# Patient Record
Sex: Female | Born: 1960 | Race: White | Hispanic: No | Marital: Single | Smoking: Former smoker
Health system: Southern US, Community
[De-identification: ages and names within clinical notes are randomized; demographics above are authoritative.]

## PROBLEM LIST (undated history)

## (undated) DIAGNOSIS — E559 Vitamin D deficiency, unspecified: Secondary | ICD-10-CM

## (undated) DIAGNOSIS — M654 Radial styloid tenosynovitis [de Quervain]: Secondary | ICD-10-CM

## (undated) DIAGNOSIS — Z862 Personal history of diseases of the blood and blood-forming organs and certain disorders involving the immune mechanism: Secondary | ICD-10-CM

## (undated) DIAGNOSIS — E039 Hypothyroidism, unspecified: Secondary | ICD-10-CM

## (undated) DIAGNOSIS — I10 Essential (primary) hypertension: Secondary | ICD-10-CM

## (undated) DIAGNOSIS — J449 Chronic obstructive pulmonary disease, unspecified: Secondary | ICD-10-CM

## (undated) DIAGNOSIS — R12 Heartburn: Secondary | ICD-10-CM

## (undated) DIAGNOSIS — E663 Overweight: Secondary | ICD-10-CM

## (undated) DIAGNOSIS — R079 Chest pain, unspecified: Secondary | ICD-10-CM

## (undated) DIAGNOSIS — D649 Anemia, unspecified: Secondary | ICD-10-CM

## (undated) HISTORY — DX: Vitamin D deficiency, unspecified: E55.9

## (undated) HISTORY — DX: Hypothyroidism, unspecified: E03.9

## (undated) HISTORY — DX: Anemia, unspecified: D64.9

## (undated) HISTORY — DX: Overweight: E66.3

## (undated) HISTORY — DX: Chronic obstructive pulmonary disease, unspecified: J44.9

## (undated) HISTORY — PX: BREAST EXCISIONAL BIOPSY: SUR124

## (undated) HISTORY — DX: Essential (primary) hypertension: I10

---
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
## 1982-09-23 HISTORY — PX: TONSILLECTOMY: SHX5217

## 1992-09-23 HISTORY — PX: ABDOMINAL HYSTERECTOMY: SHX81

## 2000-09-23 HISTORY — PX: COLON SURGERY: SHX602

## 2001-02-10 ENCOUNTER — Emergency Department (HOSPITAL_COMMUNITY): Admission: EM | Admit: 2001-02-10 | Discharge: 2001-02-10 | Payer: Self-pay | Admitting: *Deleted

## 2001-02-10 ENCOUNTER — Encounter: Payer: Self-pay | Admitting: *Deleted

## 2001-08-17 ENCOUNTER — Emergency Department (HOSPITAL_COMMUNITY): Admission: EM | Admit: 2001-08-17 | Discharge: 2001-08-17 | Payer: Self-pay | Admitting: Emergency Medicine

## 2001-08-17 ENCOUNTER — Encounter: Payer: Self-pay | Admitting: Emergency Medicine

## 2002-01-26 ENCOUNTER — Encounter: Payer: Self-pay | Admitting: Emergency Medicine

## 2002-01-26 ENCOUNTER — Emergency Department (HOSPITAL_COMMUNITY): Admission: EM | Admit: 2002-01-26 | Discharge: 2002-01-26 | Payer: Self-pay | Admitting: Emergency Medicine

## 2002-02-12 ENCOUNTER — Emergency Department (HOSPITAL_COMMUNITY): Admission: EM | Admit: 2002-02-12 | Discharge: 2002-02-12 | Payer: Self-pay | Admitting: Emergency Medicine

## 2002-05-07 ENCOUNTER — Ambulatory Visit (HOSPITAL_COMMUNITY): Admission: RE | Admit: 2002-05-07 | Discharge: 2002-05-07 | Payer: Self-pay | Admitting: Cardiology

## 2002-07-19 ENCOUNTER — Ambulatory Visit (HOSPITAL_COMMUNITY): Admission: RE | Admit: 2002-07-19 | Discharge: 2002-07-19 | Payer: Self-pay | Admitting: Family Medicine

## 2002-07-19 ENCOUNTER — Encounter: Payer: Self-pay | Admitting: Family Medicine

## 2002-07-22 ENCOUNTER — Encounter: Payer: Self-pay | Admitting: Family Medicine

## 2002-07-22 ENCOUNTER — Ambulatory Visit (HOSPITAL_COMMUNITY): Admission: RE | Admit: 2002-07-22 | Discharge: 2002-07-22 | Payer: Self-pay | Admitting: Family Medicine

## 2002-08-06 ENCOUNTER — Emergency Department (HOSPITAL_COMMUNITY): Admission: EM | Admit: 2002-08-06 | Discharge: 2002-08-06 | Payer: Self-pay | Admitting: Emergency Medicine

## 2002-08-06 ENCOUNTER — Encounter: Payer: Self-pay | Admitting: Emergency Medicine

## 2002-09-08 ENCOUNTER — Ambulatory Visit (HOSPITAL_COMMUNITY): Admission: RE | Admit: 2002-09-08 | Discharge: 2002-09-08 | Payer: Self-pay | Admitting: General Surgery

## 2002-09-10 ENCOUNTER — Inpatient Hospital Stay (HOSPITAL_COMMUNITY): Admission: RE | Admit: 2002-09-10 | Discharge: 2002-09-13 | Payer: Self-pay | Admitting: General Surgery

## 2002-12-26 ENCOUNTER — Encounter: Payer: Self-pay | Admitting: *Deleted

## 2002-12-26 ENCOUNTER — Emergency Department (HOSPITAL_COMMUNITY): Admission: EM | Admit: 2002-12-26 | Discharge: 2002-12-26 | Payer: Self-pay | Admitting: *Deleted

## 2003-02-16 ENCOUNTER — Emergency Department (HOSPITAL_COMMUNITY): Admission: EM | Admit: 2003-02-16 | Discharge: 2003-02-16 | Payer: Self-pay | Admitting: Emergency Medicine

## 2003-02-16 ENCOUNTER — Encounter: Payer: Self-pay | Admitting: Emergency Medicine

## 2003-04-20 ENCOUNTER — Emergency Department (HOSPITAL_COMMUNITY): Admission: EM | Admit: 2003-04-20 | Discharge: 2003-04-20 | Payer: Self-pay | Admitting: Emergency Medicine

## 2003-07-12 ENCOUNTER — Emergency Department (HOSPITAL_COMMUNITY): Admission: EM | Admit: 2003-07-12 | Discharge: 2003-07-12 | Payer: Self-pay | Admitting: Emergency Medicine

## 2004-02-03 ENCOUNTER — Other Ambulatory Visit: Admission: RE | Admit: 2004-02-03 | Discharge: 2004-02-03 | Payer: Self-pay | Admitting: General Surgery

## 2004-03-02 ENCOUNTER — Emergency Department (HOSPITAL_COMMUNITY): Admission: EM | Admit: 2004-03-02 | Discharge: 2004-03-02 | Payer: Self-pay | Admitting: Emergency Medicine

## 2004-04-23 ENCOUNTER — Emergency Department (HOSPITAL_COMMUNITY): Admission: EM | Admit: 2004-04-23 | Discharge: 2004-04-23 | Payer: Self-pay | Admitting: Emergency Medicine

## 2005-04-13 ENCOUNTER — Other Ambulatory Visit: Payer: Self-pay

## 2005-04-13 ENCOUNTER — Emergency Department: Payer: Self-pay | Admitting: Emergency Medicine

## 2005-10-29 ENCOUNTER — Ambulatory Visit (HOSPITAL_COMMUNITY): Admission: RE | Admit: 2005-10-29 | Discharge: 2005-10-29 | Payer: Self-pay | Admitting: Family Medicine

## 2005-11-08 ENCOUNTER — Ambulatory Visit (HOSPITAL_COMMUNITY): Admission: RE | Admit: 2005-11-08 | Discharge: 2005-11-08 | Payer: Self-pay | Admitting: Family Medicine

## 2005-12-02 ENCOUNTER — Ambulatory Visit (HOSPITAL_COMMUNITY): Admission: RE | Admit: 2005-12-02 | Discharge: 2005-12-02 | Payer: Self-pay | Admitting: Family Medicine

## 2006-10-17 ENCOUNTER — Ambulatory Visit (HOSPITAL_COMMUNITY): Admission: RE | Admit: 2006-10-17 | Discharge: 2006-10-17 | Payer: Self-pay | Admitting: Family Medicine

## 2006-10-27 ENCOUNTER — Emergency Department (HOSPITAL_COMMUNITY): Admission: EM | Admit: 2006-10-27 | Discharge: 2006-10-27 | Payer: Self-pay | Admitting: Emergency Medicine

## 2006-10-28 ENCOUNTER — Ambulatory Visit (HOSPITAL_COMMUNITY): Admission: RE | Admit: 2006-10-28 | Discharge: 2006-10-28 | Payer: Self-pay | Admitting: Family Medicine

## 2006-11-20 ENCOUNTER — Ambulatory Visit: Payer: Self-pay | Admitting: Gastroenterology

## 2006-12-24 ENCOUNTER — Encounter (INDEPENDENT_AMBULATORY_CARE_PROVIDER_SITE_OTHER): Payer: Self-pay | Admitting: *Deleted

## 2006-12-24 ENCOUNTER — Ambulatory Visit: Payer: Self-pay | Admitting: Gastroenterology

## 2007-07-17 ENCOUNTER — Emergency Department (HOSPITAL_COMMUNITY): Admission: EM | Admit: 2007-07-17 | Discharge: 2007-07-17 | Payer: Self-pay | Admitting: Emergency Medicine

## 2007-08-11 ENCOUNTER — Emergency Department (HOSPITAL_COMMUNITY): Admission: EM | Admit: 2007-08-11 | Discharge: 2007-08-11 | Payer: Self-pay | Admitting: Emergency Medicine

## 2008-01-12 ENCOUNTER — Encounter: Payer: Self-pay | Admitting: Orthopedic Surgery

## 2008-01-12 ENCOUNTER — Ambulatory Visit (HOSPITAL_COMMUNITY): Admission: RE | Admit: 2008-01-12 | Discharge: 2008-01-12 | Payer: Self-pay | Admitting: Family Medicine

## 2008-06-23 ENCOUNTER — Ambulatory Visit: Payer: Self-pay | Admitting: Orthopedic Surgery

## 2008-06-23 ENCOUNTER — Telehealth: Payer: Self-pay | Admitting: Orthopedic Surgery

## 2008-06-23 DIAGNOSIS — M654 Radial styloid tenosynovitis [de Quervain]: Secondary | ICD-10-CM

## 2008-06-23 DIAGNOSIS — M79609 Pain in unspecified limb: Secondary | ICD-10-CM | POA: Insufficient documentation

## 2008-06-23 DIAGNOSIS — S53106A Unspecified dislocation of unspecified ulnohumeral joint, initial encounter: Secondary | ICD-10-CM | POA: Insufficient documentation

## 2008-06-23 HISTORY — DX: Radial styloid tenosynovitis (de quervain): M65.4

## 2008-06-28 ENCOUNTER — Encounter: Payer: Self-pay | Admitting: Orthopedic Surgery

## 2008-06-30 ENCOUNTER — Encounter: Payer: Self-pay | Admitting: Orthopedic Surgery

## 2008-07-13 ENCOUNTER — Ambulatory Visit: Payer: Self-pay | Admitting: Orthopedic Surgery

## 2008-07-20 ENCOUNTER — Encounter: Payer: Self-pay | Admitting: Orthopedic Surgery

## 2008-07-29 ENCOUNTER — Encounter: Payer: Self-pay | Admitting: Orthopedic Surgery

## 2008-08-01 ENCOUNTER — Encounter: Payer: Self-pay | Admitting: Orthopedic Surgery

## 2008-08-02 ENCOUNTER — Encounter: Payer: Self-pay | Admitting: Orthopedic Surgery

## 2008-08-03 ENCOUNTER — Telehealth: Payer: Self-pay | Admitting: Orthopedic Surgery

## 2008-08-04 ENCOUNTER — Ambulatory Visit: Payer: Self-pay | Admitting: Orthopedic Surgery

## 2008-08-05 ENCOUNTER — Encounter: Payer: Self-pay | Admitting: Orthopedic Surgery

## 2008-08-11 ENCOUNTER — Encounter: Payer: Self-pay | Admitting: Orthopedic Surgery

## 2008-09-01 ENCOUNTER — Ambulatory Visit: Payer: Self-pay | Admitting: Orthopedic Surgery

## 2008-09-29 ENCOUNTER — Ambulatory Visit: Payer: Self-pay | Admitting: Orthopedic Surgery

## 2008-10-03 ENCOUNTER — Encounter: Payer: Self-pay | Admitting: Orthopedic Surgery

## 2008-10-10 ENCOUNTER — Emergency Department (HOSPITAL_COMMUNITY): Admission: EM | Admit: 2008-10-10 | Discharge: 2008-10-10 | Payer: Self-pay | Admitting: Emergency Medicine

## 2008-10-25 ENCOUNTER — Ambulatory Visit (HOSPITAL_COMMUNITY): Admission: RE | Admit: 2008-10-25 | Discharge: 2008-10-25 | Payer: Self-pay | Admitting: Family Medicine

## 2008-11-01 ENCOUNTER — Encounter: Payer: Self-pay | Admitting: Orthopedic Surgery

## 2008-11-03 ENCOUNTER — Ambulatory Visit: Payer: Self-pay | Admitting: Orthopedic Surgery

## 2008-12-01 ENCOUNTER — Ambulatory Visit: Payer: Self-pay | Admitting: Orthopedic Surgery

## 2008-12-20 ENCOUNTER — Encounter: Payer: Self-pay | Admitting: Orthopedic Surgery

## 2008-12-21 ENCOUNTER — Encounter: Payer: Self-pay | Admitting: Orthopedic Surgery

## 2009-01-11 ENCOUNTER — Encounter: Payer: Self-pay | Admitting: Orthopedic Surgery

## 2009-11-22 ENCOUNTER — Emergency Department: Payer: Self-pay | Admitting: Emergency Medicine

## 2011-02-08 NOTE — Op Note (Signed)
   NAME:  Stephanie Howe, Stephanie Howe                          ACCOUNT NO.:  [id number]   MEDICAL RECORD NO.:  [id number]                   PATIENT TYPE:  INP   LOCATION:  A332                                 FACILITY:  [facility]   PHYSICIAN:  Dirk Dress. Katrinka Blazing, M.D.                DATE OF BIRTH:  Feb 18, 1961   DATE OF PROCEDURE:  09/10/2002  DATE OF DISCHARGE:                                 OPERATIVE REPORT   PREOPERATIVE DIAGNOSES:  Chronic inflammation of the left colon, chronic  left breast abscess.   POSTOPERATIVE DIAGNOSES:  Left breast abscess, inflamed appendiceal  epiploica.   PROCEDURE:  Left breast biopsy and exploratory laparotomy.   SURGEON:  Dirk Dress. Katrinka Blazing, M.D.   DESCRIPTION OF PROCEDURE:  Under general anesthesia, the patient's left  breast and abdomen were prepped and draped in a sterile field. An elliptical  incision was made around the mass in the lower outer quadrant of the left  breast. It was extended down to the subcutaneous fat. The mass was excised  in its entirety. The tissues were closed with 3-0 Biosyn and the skin was  closed with subcuticular 4-0 Dexon. A dressing was placed. Attention was  then turned to the abdomen. A midline incision was made. Upon entering the  abdomen, there was no free fluid. There was some thickening of about three  appendiceal epiploica in the sigmoid colon at its junction with the  descending colon. The colon wall appeared to be normal. There was no  thickening of the bowel wall per say. This was followed down through the  sigmoid to the rectum down to the peritoneal reflection. No inflammation was  noted. No diverticulosis was noted. The mesentery was normal. There was no  inflammation of the overlying peritoneum. The stomach was normal. The  descending colon and transverse colon were normal. The right colon was  normal including the cecum. The appendix was absent. The small bowel was  normal, there were no increased nodes. The stomach, liver  and gallbladder  were also normal. The inflamed appendiceal epiploica were excised.  Irrigation was carried out, the abdomen was then closed with #1 Prolene on  the fascia, 3-0 Biosyn in the subcutaneous tissue and staples on the skin.  She tolerated the procedure well. Sponge and needle counts were correct x2.                                               Dirk Dress. Katrinka Blazing, M.D.    LCS/MEDQ  D:  09/10/2002  T:  09/11/2002  Job:  [id number]

## 2011-02-08 NOTE — Discharge Summary (Signed)
NAME:  Stephanie Howe, Stephanie Howe                          ACCOUNT NO.:  [id number]   MEDICAL RECORD NO.:  [id number]                   PATIENT TYPE:  INP   LOCATION:  A332                                 FACILITY:  [facility]   PHYSICIAN:  Dirk Dress. Katrinka Blazing, M.D.                DATE OF BIRTH:  01/07/1961   DATE OF ADMISSION:  09/10/2002  DATE OF DISCHARGE:  09/13/2002                                 DISCHARGE SUMMARY   DISCHARGE DIAGNOSES:  1. Mild proctitis.  2. Inflamed appendiceal epiploica.  3. Left breast abscess.  4. Chronic depression.  5. Peptic ulcer disease.   SPECIAL PROCEDURES:  1. Total colonoscopy September 08, 2002.  2. Left breast biopsy September 10, 2002.  3. Exploratory laparotomy September 10, 2002.   DISPOSITION:  The patient is discharged home in stable and satisfactory  condition.   DISCHARGE MEDICATIONS:  1. Demerol 50 mg every four hours as needed for pain.  2. Hemocyte Plus 1 daily.  3. Prevacid 30 mg daily.   FOLLOW-UP:  The patient is advised to have follow-up in the office on  September 20, 2002.   SUMMARY:  The patient is a 50 year old female with a history of acute left  lower quadrant pain in November.  She was treated for suspected diverticular  disease.  CT scan of the abdomen was done, and it showed thickening of the  sigmoid colon with past inflammatory changes.  She was treated with  Levaquin.  The patient had exacerbation of symptoms in early December.  She  was scheduled for colonoscopy and laparotomy and possible colon resection.  The patient also had a chronic nonhealing abscess of the left breast which  had been treated with antibiotics using multiple courses without resolution.  She was scheduled to have this excised during the same surgery.  Other  specifics of her history are given in the admission note.  The patient  underwent bowel prep at home.  She was admitted through day surgery and  underwent colonoscopy to the cecum on September 08, 2002.   She was found to  have inflammation of the rectum, and there was induration and thickening of  the distal sigmoid at 35-40 cm, but this appeared to be extrinsic.  There  was no intrinsic intraluminal mass.  She was operated on on September 10, 2002.  The breast showed chronic left breast abscess.  It was primarily  excised and closed without difficulty.  She  underwent exploratory laparotomy.  This only revealed an inflamed  appendiceal epiploica, which was removed.  She did very well thereafter.  She had good return of intestinal activity.  She had no further problems and  was discharged home on the morning of the third postoperative day in  satisfactory condition.  Dirk Dress. Katrinka Blazing, M.D.    LCS/MEDQ  D:  12/03/2002  T:  12/04/2002  Job:  [id number]

## 2011-02-08 NOTE — H&P (Signed)
NAME:  Stephanie Howe, HOUGLAND                          ACCOUNT NO.:  [id number]   MEDICAL RECORD NO.:  [id number]                   PATIENT TYPE:  AMB   LOCATION:  DAY                                  FACILITY:  [facility]   PHYSICIAN:  Dirk Dress. Katrinka Blazing, M.D.                DATE OF BIRTH:  04/03/1961   DATE OF ADMISSION:  DATE OF DISCHARGE:                                HISTORY & PHYSICAL   HISTORY OF PRESENT ILLNESS:  [age]-year-old female with history of acute  onset of left lower quadrant pain in early November.  She was seen in the  office and treated for suspected diverticular disease.  She later was seen  in the emergency room.  A CT scan was done.  It showed thickening of the  sigmoid colon with suspect inflammatory changes.  She was started on  Levaquin.  She has not had rectal bleeding or diarrhea.  The patient had  exacerbation of symptoms in early December.  She is scheduled for  colonoscopy followed by exploratory laparotomy with possible sigmoid colon  resection based on operative findings.  The patient also has a chronic  nonhealing abscess of her left breast.  She has been treated with multiple  courses of antibiotics including doxycycline and Ancef.  She continues to  have persistent pain and induration of her breast, and she is scheduled for  left breast biopsy.   PAST HISTORY:  She has chronic depression, peptic ulcer disease and a recent  bout of acute bronchitis.   PRESENT MEDICATIONS:  1. Wellbutrin 150 mg b.i.d.  2. Levaquin 500 mg q.d.  3. Doxycycline 100 mg b.i.d.  4. Prevacid 30 mg q.d.   PAST SURGICAL HISTORY:  1. Appendectomy.  2. TAH/BSO.  3. Tonsillectomy.   ALLERGIES:  CODEINE and PENICILLIN.   PHYSICAL EXAMINATION:  VITAL SIGNS:  On exam blood pressure is 130/90, pulse  76, respirations 18 and weight 165 pounds.  HEENT:  Unremarkable.  NECK:  Neck is supple without JVD or bruits.  CHEST:  Clear to auscultation.  HEART:  Regular rate and rhythm  without murmur, gallop or rub.  BREASTS:  Chronic indurated mass, left lower outer quadrant of breast.  Axilla normal.  ABDOMEN:  Moderate tenderness in the left lower quadrant with guarding, no  rebound.  Also with tenderness in the suprapubic area.  EXTREMITIES:  Without clubbing, cyanosis or edema.  NEUROLOGIC EXAMINATION:  Nonfocal.    IMPRESSION:  1. Polypoid mass in sigmoid colon.  2. Chronic left breast abscess.  3. Depression.  4. Peptic ulcer disease.   PLAN:  Colonoscopy followed by possible exploratory laparotomy and sigmoid  colon resection.  Dirk Dress. Katrinka Blazing, M.D.    LCS/MEDQ  D:  09/08/2002  T:  09/08/2002  Job:  [id number]

## 2011-02-08 NOTE — Discharge Summary (Signed)
   NAME:  Stephanie Howe, Stephanie Howe                          ACCOUNT NO.:  [id number]   MEDICAL RECORD NO.:  [id number]                   PATIENT TYPE:  INP   LOCATION:  A332                                 FACILITY:  [facility]   PHYSICIAN:  Dirk Dress. Katrinka Blazing, M.D.                DATE OF BIRTH:  [birth year]   DATE OF ADMISSION:  09/10/2002  DATE OF DISCHARGE:  09/13/2002                                 DISCHARGE SUMMARY   DISCHARGE DIAGNOSES:  1.   Dictation ended here.                                               Dirk Dress. Katrinka Blazing, M.D.    LCS/MEDQ  D:  12/03/2002  T:  12/03/2002  Job:  [id number]

## 2011-02-08 NOTE — Assessment & Plan Note (Signed)
[facility]                         GASTROENTEROLOGY OFFICE NOTE   DANAYSHA, KIRN                       MRN:          [id number]  DATE:11/20/2006                            DOB:          12/19/1960    REFERRING PHYSICIAN:  Jeoffrey Massed, MD   REASON FOR REFERRAL:  Hemoccult positive stool and iron deficiency.   HISTORY OF PRESENT ILLNESS:  Mrs. Zagal is a 50 year old white female,  referred through the courtesy of Dr. Nicoletta Ba.  She has had  intermittent problems with GERD for the past several years that are well  controlled on daily Prevacid and antireflux measures.  She had problems  about 2 months ago with mid sternal pain radiating below her left breast  to her left lateral chest that was clearly exacerbated by a severe  cough, and these symptoms have completely resolved as her cough  resolved.  She previously underwent colonoscopy in [facility]  by Dr. Elpidio Anis in December of 2003 for abdominal pain, a change in  bowel habits, and hemoccult positive stool.  A segmental colitis of the  sigmoid colon was noted, as well as inflammatory changes within the  rectum.  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  The sigmiod colon was not biopsied. It does not appear that a diagnosis  of inflammatory bowel disease was established, and she does not recall  being on any typical medications to treat for inflammatory bowel  disease.  She has had no problems with diarrhea, constipation, change in  stool caliber, melena, hematochezia, weight loss, fevers or chills.  She  was recently found to have 1 out of 3 cards positive for occult blood,  and she had a microcytosis noted on the CBC.  Her hemoglobin was 12.1,  in the low normal range of 12-15.  MCV was low at 73.9, her platelet  count was elevated at 512,000.  Iron saturation was 4%, iron level 18,  total iron binding capacity normal at 427.   There is no  family history of colon cancer, colon polyps or inflammatory  bowel disease.   PAST MEDICAL HISTORY:  GERD, hypertension, chronic bronchitis,  unspecified proctosigmoiditis in 2003.   SURGERIES:  Status post tonsillectomy, status post hysterectomy, status  post exploratory laparotomy by Dr. Elpidio Anis, status post cardiac  catheterization in December 2006 with no significant findings per  patient.   CURRENT MEDICATIONS:  Listed on the chart, updated and reviewed.   MEDICATION ALLERGIES:  PENICILLIN AND CODEINE.   SOCIAL HISTORY AND REVIEW OF SYSTEMS:  Per the handwritten form.   PHYSICAL EXAMINATION:  Overweight white female, no distress.  Height 5 feet 1 inch, weight 156 pounds, blood pressure 118/78, pulse 68  and regular.  HEENT EXAM:  Anicteric sclerae, oropharynx clear.  NECK:  Without thyromegaly or adenopathy appreciated.  CHEST:  Clear to auscultation bilaterally.  CARDIAC:  Regular rate and rhythm without murmurs.  ABDOMEN:  Soft, nontender, nondistended, normoactive bowel sounds.  No  palpable organomegaly, masses or hernias.  RECTAL EXAM:  Deferred to time of colonoscopy.  EXTREMITIES:  Without cyanosis, clubbing, or edema.  NEUROLOGIC:  Alert and oriented x3.  Grossly nonfocal.   ASSESSMENT AND PLAN:  1. Hemoccult positive stool and microcytosis with a low-normal      hemoglobin.  History of an unspecified proctitis vs.      proctosigmoiditis in 2003.  Rule out colorectal neoplasms,      inflammatory bowel disease, and other sources of chronic      gastrointestinal blood loss.  Risks, benefits, and alternatives to      colonoscopy with possible biopsy and possible polypectomy discussed      with the patient, she consents to proceed.  This will be scheduled      electively.  2. Chronic gastroesophageal reflux disease.  Continue antireflux      measures at Prevacid 30 mg daily.  If her colonoscopy is      unremarkable, and she does not respond adequately to iron       replacement, we will need to consider upper endoscopy to further      evaluate.  3. Iron deficiency.  Continue iron replacement as prescribed.     Venita Lick. Russella Dar, MD, [facility]  Electronically Signed    MTS/MedQ  DD: 11/20/2006  DT: 11/20/2006  Job #: [id number]   cc:   Jeoffrey Massed, MD

## 2011-02-08 NOTE — Cardiovascular Report (Signed)
   NAME:  Stephanie Howe, Stephanie Howe                          ACCOUNT NO.:  [id number]   MEDICAL RECORD NO.:  [id number]                   PATIENT TYPE:  OIB   LOCATION:  2899                                 FACILITY:  [facility]   PHYSICIAN:  Salvadore Farber, MD [facility]            DATE OF BIRTH:  [birth year]   DATE OF PROCEDURE:  05/07/2002  DATE OF DISCHARGE:  05/07/2002                              CARDIAC CATHETERIZATION   PROCEDURE:  Left heart catheterization, left ventriculography, coronary  angiography.   INDICATIONS FOR PROCEDURE:  Chest pain, equivocal exercise tolerance test.   DIAGNOSTIC TECHNIQUE:  Informed consent was obtained.  Under 2% lidocaine  local anesthesia, a #6 French sheath was placed in the right femoral artery  using the modified Seldinger technique.  Angiography was performed with JL4  and JR4 catheters.  A #6 French pigtail catheter was advanced into the left  ventricle.  Pressures were measured.  Ventriculography was performed in the  RAO projection by power injection.  Following the procedure, the sheaths  were removed in the holding room.   FINDINGS:  1. Left main:  Angiographically normal.  2. LAD:  The LAD is a moderate-sized vessel giving rise to two small     diagonal branches.  The vessel was normal.  3. Ramus intermedius:  Moderate-sized vessel.  The vessel is normal.  4. Circumflex:  Small vessel.  Angiographically normal.  5. RCA:  Large RCA which is angiographically normal.  6. LV:  Ejection fraction approximately 60% with no regional wall motion     abnormality.  LVEDP equals 19 after the administration of contrast.  7. No aortic stenosis on pullback.  8. No mitral regurgitation.   IMPRESSION:  Angiographically normal coronary arteries and normal left  ventricular systolic function.   PLAN:  She will be discharged home 6 hours after her sheath removal.  She  was again counseled about the importance of smoking cessation for her long-  term health.                                             Salvadore Farber, MD [facility]    WED/MEDQ  D:  05/07/2002  T:  05/10/2002  Job:  [id number]

## 2011-07-02 LAB — CBC
Hemoglobin: 11.6 — ABNORMAL LOW
MCV: 73.1 — ABNORMAL LOW
RBC: 4.75
WBC: 4

## 2011-07-02 LAB — BASIC METABOLIC PANEL
CO2: 26
Calcium: 9
GFR calc Af Amer: >60
Glucose, Bld: 113 — ABNORMAL HIGH
Potassium: 3.8

## 2011-07-02 LAB — DIFFERENTIAL
Basophils Relative: 1
Eosinophils Absolute: 0 — ABNORMAL LOW
Eosinophils Relative: 0
Monocytes Absolute: 0.4
Monocytes Relative: 11
Neutrophils Relative %: 69

## 2011-07-03 LAB — DIFFERENTIAL
Basophils Absolute: 0.1
Eosinophils Absolute: 0.3
Eosinophils Relative: 4
Monocytes Absolute: 0.5
Monocytes Relative: 5
Neutrophils Relative %: 58

## 2011-07-03 LAB — BASIC METABOLIC PANEL
CO2: 28
Calcium: 9.4
Chloride: 103
Glucose, Bld: 105 — ABNORMAL HIGH
Potassium: 3.7
Sodium: 134 — ABNORMAL LOW

## 2011-07-03 LAB — POCT CARDIAC MARKERS
CKMB, poc: 1.3
Myoglobin, poc: 52.6
Myoglobin, poc: 55.8
Operator id: 267301
Troponin i, poc: <0.05

## 2011-07-03 LAB — URINALYSIS, ROUTINE W REFLEX MICROSCOPIC
Glucose, UA: NEGATIVE
Hgb urine dipstick: NEGATIVE
Protein, ur: NEGATIVE
pH: 6.5

## 2011-07-03 LAB — CBC
RBC: 4.17
RDW: 16.6 — ABNORMAL HIGH
WBC: 9.6

## 2011-07-03 LAB — D-DIMER, QUANTITATIVE: D-Dimer, Quant: 0.33

## 2011-07-03 LAB — I-STAT 8, (EC8 V) (CONVERTED LAB)
BUN: 5 — ABNORMAL LOW
Hemoglobin: 11.2 — ABNORMAL LOW
Potassium: 3.8
pH, Ven: 7.398 — ABNORMAL HIGH

## 2011-11-26 ENCOUNTER — Encounter: Payer: Self-pay | Admitting: Gastroenterology

## 2011-12-14 ENCOUNTER — Emergency Department: Payer: Self-pay | Admitting: Emergency Medicine

## 2012-06-06 ENCOUNTER — Emergency Department: Payer: Self-pay | Admitting: Emergency Medicine

## 2012-06-06 LAB — BASIC METABOLIC PANEL
Anion Gap: 7 (ref 7–16)
BUN: 7 mg/dL (ref 7–18)
Calcium, Total: 9.2 mg/dL (ref 8.5–10.1)
Chloride: 104 mmol/L (ref 98–107)
Co2: 30 mmol/L (ref 21–32)
Creatinine: 0.78 mg/dL (ref 0.60–1.30)
EGFR (African American): >60
EGFR (Non-African Amer.): >60
Glucose: 79 mg/dL (ref 65–99)
Osmolality: 278 (ref 275–301)
Potassium: 4 mmol/L (ref 3.5–5.1)
Sodium: 141 mmol/L (ref 136–145)

## 2012-06-06 LAB — CBC
HCT: 39.7 % (ref 35.0–47.0)
HGB: 12.9 g/dL (ref 12.0–16.0)
MCH: 26.1 pg (ref 26.0–34.0)
MCHC: 32.4 g/dL (ref 32.0–36.0)
MCV: 80 fL (ref 80–100)
Platelet: 344 10*3/uL (ref 150–440)
RBC: 4.94 10*6/uL (ref 3.80–5.20)
RDW: 15.6 % — ABNORMAL HIGH (ref 11.5–14.5)
WBC: 8.9 10*3/uL (ref 3.6–11.0)

## 2012-06-06 LAB — CK TOTAL AND CKMB (NOT AT ARMC)
CK, Total: 118 U/L (ref 21–215)
CK-MB: 1.4 ng/mL (ref 0.5–3.6)

## 2012-09-20 ENCOUNTER — Emergency Department: Payer: Self-pay | Admitting: Emergency Medicine

## 2012-09-20 LAB — RAPID INFLUENZA A&B ANTIGENS

## 2012-11-11 ENCOUNTER — Emergency Department: Payer: Self-pay | Admitting: Unknown Physician Specialty

## 2013-09-13 ENCOUNTER — Emergency Department: Payer: Self-pay | Admitting: Emergency Medicine

## 2013-09-13 LAB — URINALYSIS, COMPLETE
Bilirubin,UR: NEGATIVE
Ketone: NEGATIVE
Ph: 6 (ref 4.5–8.0)
RBC,UR: 6 /HPF (ref 0–5)
Specific Gravity: 1.003 (ref 1.003–1.030)
WBC UR: 22 /HPF (ref 0–5)

## 2013-11-02 ENCOUNTER — Ambulatory Visit: Payer: Self-pay | Admitting: Family Medicine

## 2013-11-02 LAB — HM MAMMOGRAPHY

## 2013-11-12 ENCOUNTER — Ambulatory Visit: Payer: Self-pay | Admitting: Urology

## 2013-11-30 ENCOUNTER — Emergency Department: Payer: Self-pay | Admitting: Emergency Medicine

## 2013-11-30 LAB — COMPREHENSIVE METABOLIC PANEL
ALBUMIN: 3.8 g/dL (ref 3.4–5.0)
ALK PHOS: 93 U/L
ALT: 17 U/L (ref 12–78)
ANION GAP: 7 (ref 7–16)
BUN: 8 mg/dL (ref 7–18)
Bilirubin,Total: 0.3 mg/dL (ref 0.2–1.0)
CHLORIDE: 103 mmol/L (ref 98–107)
CO2: 26 mmol/L (ref 21–32)
Calcium, Total: 9.1 mg/dL (ref 8.5–10.1)
Creatinine: 0.86 mg/dL (ref 0.60–1.30)
EGFR (African American): >60
Glucose: 80 mg/dL (ref 65–99)
Osmolality: 269 (ref 275–301)
Potassium: 3.8 mmol/L (ref 3.5–5.1)
SGOT(AST): 19 U/L (ref 15–37)
SODIUM: 136 mmol/L (ref 136–145)
TOTAL PROTEIN: 7.4 g/dL (ref 6.4–8.2)

## 2013-11-30 LAB — CBC
HCT: 37.5 % (ref 35.0–47.0)
HGB: 12.6 g/dL (ref 12.0–16.0)
MCH: 26.9 pg (ref 26.0–34.0)
MCHC: 33.5 g/dL (ref 32.0–36.0)
MCV: 81 fL (ref 80–100)
Platelet: 323 10*3/uL (ref 150–440)
RBC: 4.66 10*6/uL (ref 3.80–5.20)
RDW: 14.9 % — AB (ref 11.5–14.5)
WBC: 7.6 10*3/uL (ref 3.6–11.0)

## 2013-11-30 LAB — URINALYSIS, COMPLETE
BLOOD: NEGATIVE
Bacteria: NONE SEEN
Bilirubin,UR: NEGATIVE
GLUCOSE, UR: NEGATIVE mg/dL (ref 0–75)
KETONE: NEGATIVE
Leukocyte Esterase: NEGATIVE
Nitrite: NEGATIVE
PROTEIN: NEGATIVE
Ph: 5 (ref 4.5–8.0)
RBC,UR: <1 /HPF (ref 0–5)
Specific Gravity: 1.008 (ref 1.003–1.030)
Squamous Epithelial: <1
WBC UR: <1 /HPF (ref 0–5)

## 2013-11-30 LAB — TROPONIN I: Troponin-I: <0.02 ng/mL

## 2014-01-04 ENCOUNTER — Ambulatory Visit: Payer: Self-pay | Admitting: Family Medicine

## 2014-05-30 IMAGING — CR RIGHT FOREARM - 2 VIEW
1 series · 2 of 2 positions shown · non-contrast
Comparison: None.

CLINICAL DATA: Palpable abnormality

EXAM:
RIGHT FOREARM - 2 VIEW

[Series 1: ap · 0.17mm/px · 2 of 2 slices shown]
[im 1/2]
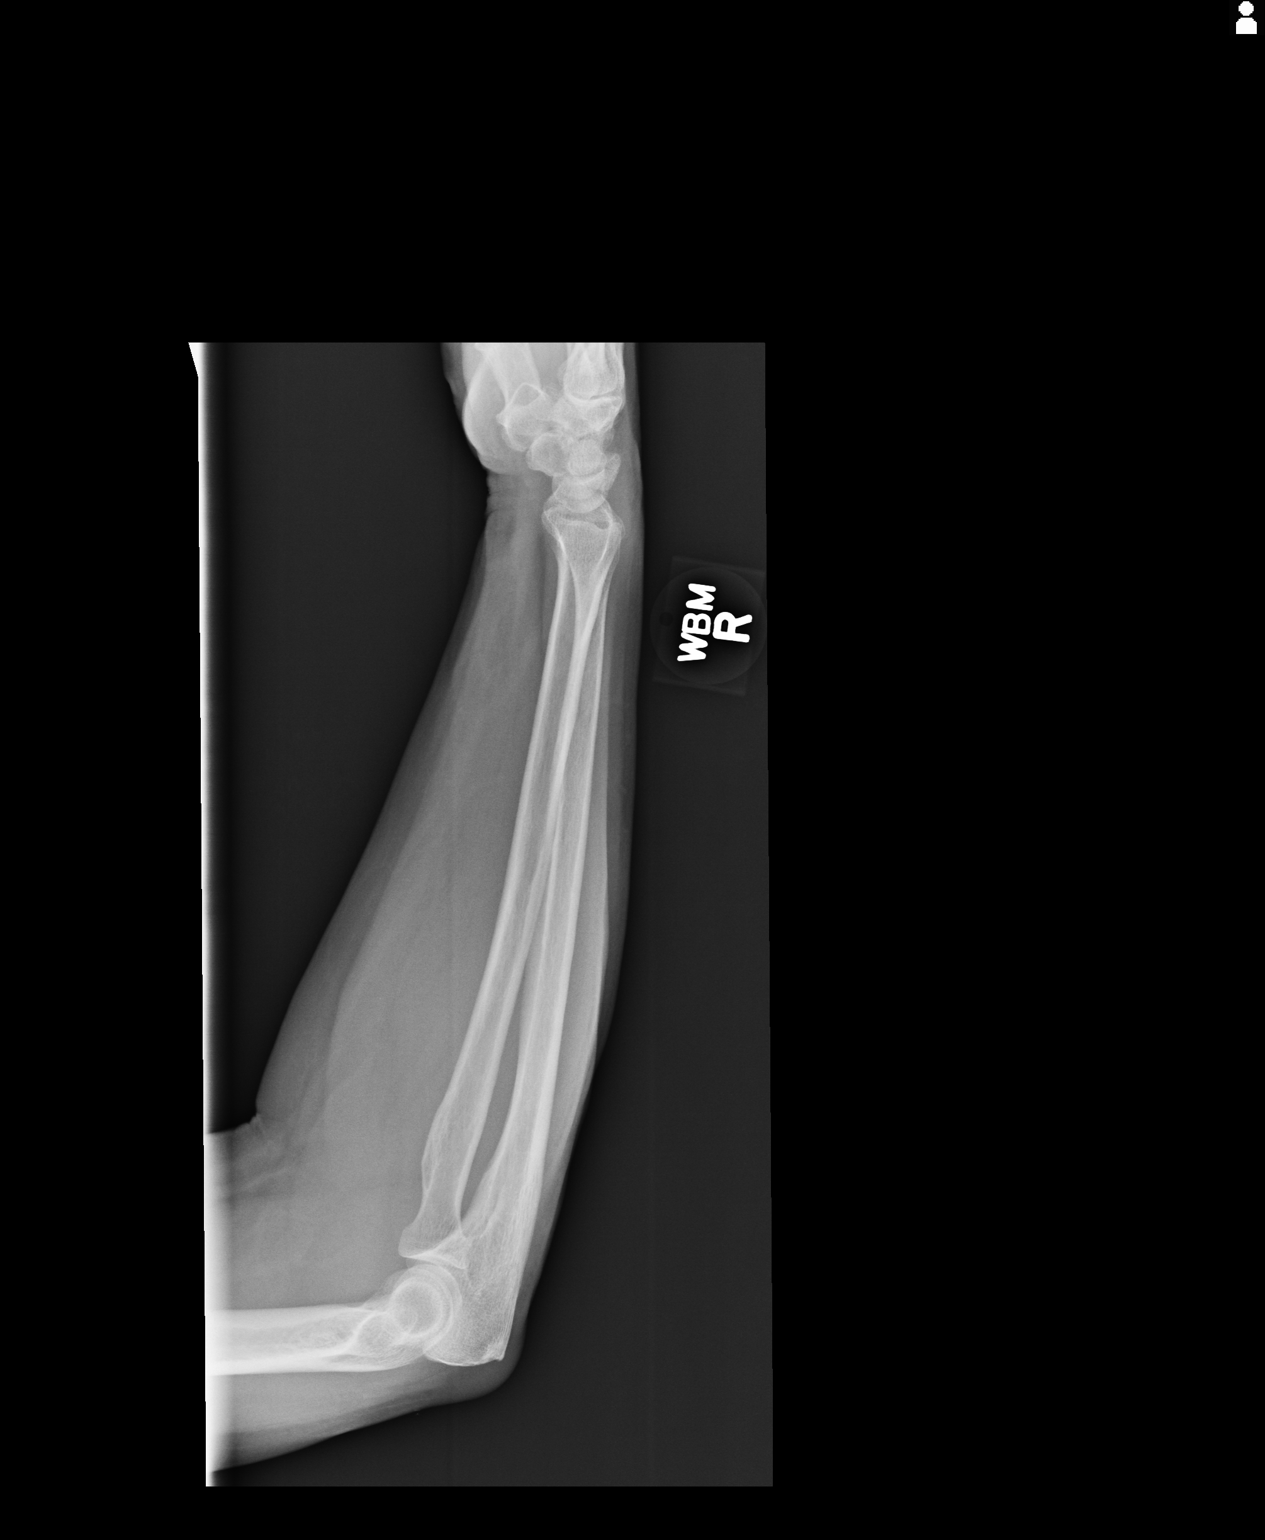
[im 2/2]
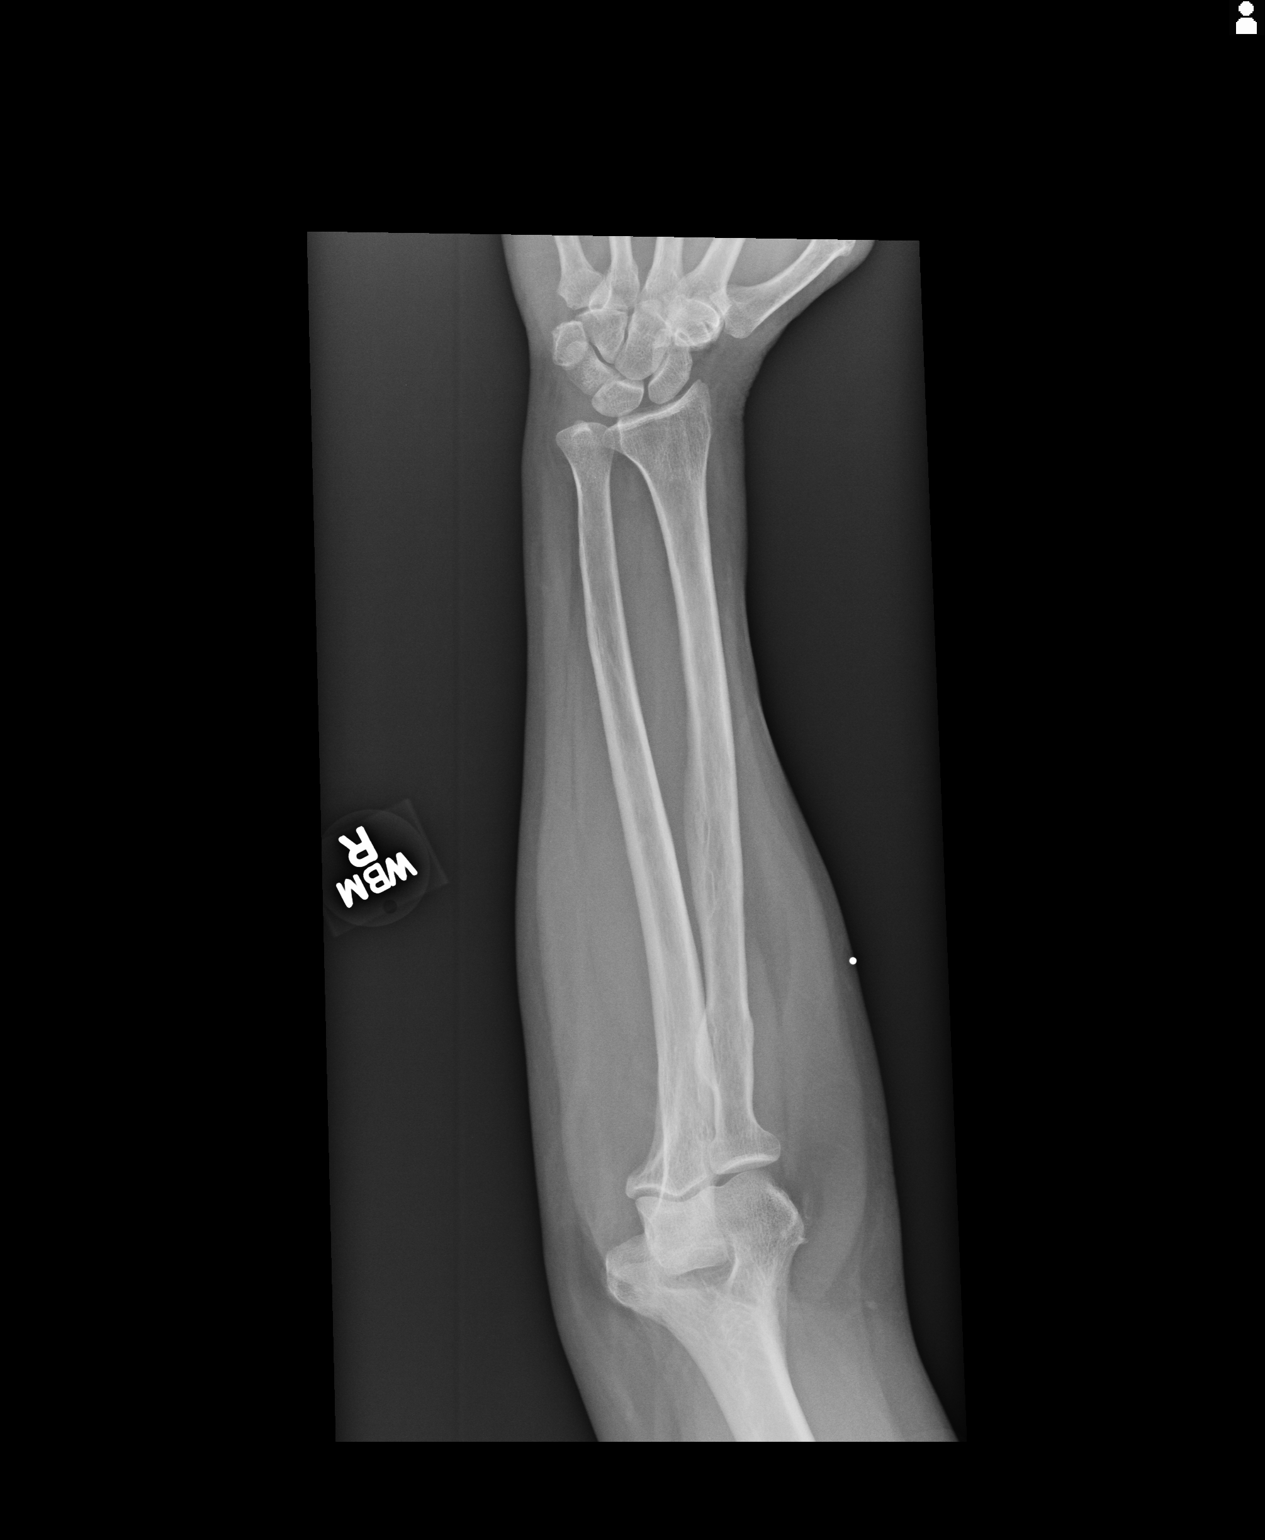

[2 of 2 positions shown; findings below may reference images not displayed]

FINDINGS: No acute fracture or dislocation is noted. There is a lucency
identified in the subcutaneous tissues beneath the marking BB which
may represent a focal lipoma.
IMPRESSION: Possible lipoma.  No other focal abnormality is noted.

## 2015-01-25 ENCOUNTER — Ambulatory Visit: Payer: Self-pay | Admitting: Internal Medicine

## 2015-02-08 ENCOUNTER — Other Ambulatory Visit: Payer: Self-pay

## 2015-02-08 LAB — HEMOGLOBIN A1C: Hemoglobin A1C: 5.9

## 2015-02-15 ENCOUNTER — Ambulatory Visit: Payer: Self-pay | Admitting: Internal Medicine

## 2015-02-15 ENCOUNTER — Other Ambulatory Visit: Payer: Self-pay

## 2015-02-22 ENCOUNTER — Ambulatory Visit: Payer: Self-pay | Admitting: Internal Medicine

## 2015-03-01 ENCOUNTER — Ambulatory Visit: Payer: Self-pay | Admitting: Ophthalmology

## 2015-03-08 ENCOUNTER — Ambulatory Visit: Payer: Self-pay | Attending: Oncology | Admitting: *Deleted

## 2015-03-08 ENCOUNTER — Encounter: Payer: Self-pay | Admitting: *Deleted

## 2015-03-08 ENCOUNTER — Ambulatory Visit
Admission: RE | Admit: 2015-03-08 | Discharge: 2015-03-08 | Disposition: A | Discharge status: Home / Self Care | Payer: Self-pay | Source: Ambulatory Visit | Attending: Oncology | Admitting: Oncology

## 2015-03-08 ENCOUNTER — Encounter (INDEPENDENT_AMBULATORY_CARE_PROVIDER_SITE_OTHER): Payer: Self-pay

## 2015-03-08 VITALS — BP 128/71 | HR 60 | Temp 99.0°F | Resp 20 | Ht 61.81 in | Wt 141.2 lb

## 2015-03-08 DIAGNOSIS — Z0000 Encounter for general adult medical examination without abnormal findings: Secondary | ICD-10-CM

## 2015-03-08 NOTE — Progress Notes (Signed)
Subjective:     Patient ID: Stephanie Howe, female   DOB: 11-06-1960, 55 y.o.   MRN: [id number]  HPI   Review of Systems     Objective:   Physical Exam  Pulmonary/Chest: Right breast exhibits no inverted nipple, no mass, no nipple discharge, no skin change and no tenderness. Left breast exhibits no inverted nipple, no mass, no nipple discharge, no skin change and no tenderness. Breasts are symmetrical.       Assessment:     54 year old White female presents to [facility] for clinical breast exam and mammogram.  Clinical breast exam unremarkable.  Patient has large pendulous breast.  Taught self breast awareness.  Patient has been screened for eligibility.  She does not have any insurance, Medicare or Medicaid.  She also meets financial eligibility.  Hand-out given on the Affordable Care Act.    Plan:     Screening mammogram ordered.  Follow-up per protocol.

## 2015-03-16 ENCOUNTER — Encounter: Payer: Self-pay | Admitting: *Deleted

## 2015-03-16 NOTE — Progress Notes (Signed)
Letter mailed from the [facility] to inform patient of her normal mammogram results.  Patient is to follow-up with annual screening in one year. 

## 2015-03-23 ENCOUNTER — Ambulatory Visit: Payer: Self-pay

## 2015-03-30 ENCOUNTER — Ambulatory Visit: Payer: Self-pay | Admitting: Ophthalmology

## 2015-05-17 ENCOUNTER — Ambulatory Visit: Payer: Self-pay | Admitting: Internal Medicine

## 2015-06-07 ENCOUNTER — Ambulatory Visit: Payer: Self-pay

## 2015-07-05 ENCOUNTER — Other Ambulatory Visit: Payer: Self-pay

## 2015-07-19 ENCOUNTER — Ambulatory Visit: Payer: Self-pay | Admitting: Internal Medicine

## 2015-10-05 ENCOUNTER — Ambulatory Visit: Payer: Self-pay | Admitting: Ophthalmology

## 2015-10-18 ENCOUNTER — Other Ambulatory Visit: Payer: Self-pay

## 2015-10-18 LAB — CBC AND DIFFERENTIAL
Neutrophils Absolute: 5 /uL
WBC: 7.3 10^3/mL

## 2015-10-18 LAB — HEPATIC FUNCTION PANEL
AST: 20 U/L (ref 13–35)
Alkaline Phosphatase: 81 U/L (ref 25–125)
Bilirubin, Total: 0.3 mg/dL

## 2015-10-18 LAB — BASIC METABOLIC PANEL
BUN: 1 mg/dL — AB (ref 4–21)
Creatinine: 12 mg/dL — AB (ref 0.5–1.1)
Glucose: 82 mg/dL
Sodium: 138 mmol/L (ref 137–147)

## 2015-10-18 LAB — TSH: TSH: 5.25 u[IU]/mL (ref 0.41–5.90)

## 2015-10-25 ENCOUNTER — Ambulatory Visit: Payer: Self-pay | Admitting: Internal Medicine

## 2015-11-22 DIAGNOSIS — E039 Hypothyroidism, unspecified: Secondary | ICD-10-CM | POA: Insufficient documentation

## 2015-11-22 DIAGNOSIS — E559 Vitamin D deficiency, unspecified: Secondary | ICD-10-CM | POA: Insufficient documentation

## 2015-11-22 DIAGNOSIS — D649 Anemia, unspecified: Secondary | ICD-10-CM | POA: Insufficient documentation

## 2015-11-22 DIAGNOSIS — N200 Calculus of kidney: Secondary | ICD-10-CM | POA: Insufficient documentation

## 2015-11-22 DIAGNOSIS — J449 Chronic obstructive pulmonary disease, unspecified: Secondary | ICD-10-CM | POA: Insufficient documentation

## 2015-11-22 DIAGNOSIS — I10 Essential (primary) hypertension: Secondary | ICD-10-CM | POA: Insufficient documentation

## 2015-11-23 ENCOUNTER — Emergency Department
Admission: EM | Admit: 2015-11-23 | Discharge: 2015-11-23 | Disposition: A | Discharge status: Home / Self Care | Payer: BLUE CROSS/BLUE SHIELD | Attending: Emergency Medicine | Admitting: Emergency Medicine

## 2015-11-23 ENCOUNTER — Encounter: Payer: Self-pay | Admitting: Emergency Medicine

## 2015-11-23 DIAGNOSIS — Z7982 Long term (current) use of aspirin: Secondary | ICD-10-CM | POA: Diagnosis not present

## 2015-11-23 DIAGNOSIS — N200 Calculus of kidney: Secondary | ICD-10-CM | POA: Diagnosis not present

## 2015-11-23 DIAGNOSIS — R109 Unspecified abdominal pain: Secondary | ICD-10-CM

## 2015-11-23 DIAGNOSIS — I10 Essential (primary) hypertension: Secondary | ICD-10-CM | POA: Insufficient documentation

## 2015-11-23 DIAGNOSIS — Z880 Allergy status to penicillin: Secondary | ICD-10-CM | POA: Diagnosis not present

## 2015-11-23 DIAGNOSIS — Z87891 Personal history of nicotine dependence: Secondary | ICD-10-CM | POA: Insufficient documentation

## 2015-11-23 DIAGNOSIS — Z79899 Other long term (current) drug therapy: Secondary | ICD-10-CM | POA: Diagnosis not present

## 2015-11-23 DIAGNOSIS — R10A0 Flank pain, unspecified side: Secondary | ICD-10-CM

## 2015-11-23 LAB — CBC
HEMATOCRIT: 38.8 % (ref 35.0–47.0)
HEMOGLOBIN: 13 g/dL (ref 12.0–16.0)
MCH: 27.1 pg (ref 26.0–34.0)
MCHC: 33.5 g/dL (ref 32.0–36.0)
MCV: 81 fL (ref 80.0–100.0)
Platelets: 360 10*3/uL (ref 150–440)
RBC: 4.79 MIL/uL (ref 3.80–5.20)
RDW: 14.5 % (ref 11.5–14.5)
WBC: 9.1 10*3/uL (ref 3.6–11.0)

## 2015-11-23 LAB — URINALYSIS COMPLETE WITH MICROSCOPIC (ARMC ONLY)
BACTERIA UA: NONE SEEN
Bilirubin Urine: NEGATIVE
GLUCOSE, UA: NEGATIVE mg/dL
Hgb urine dipstick: NEGATIVE
Ketones, ur: NEGATIVE mg/dL
Leukocytes, UA: NEGATIVE
Nitrite: NEGATIVE
PROTEIN: NEGATIVE mg/dL
Specific Gravity, Urine: 1.003 — ABNORMAL LOW (ref 1.005–1.030)
pH: 6 (ref 5.0–8.0)

## 2015-11-23 LAB — BASIC METABOLIC PANEL
ANION GAP: 8 (ref 5–15)
BUN: 12 mg/dL (ref 6–20)
CHLORIDE: 105 mmol/L (ref 101–111)
CO2: 27 mmol/L (ref 22–32)
Calcium: 9.8 mg/dL (ref 8.9–10.3)
Creatinine, Ser: 0.69 mg/dL (ref 0.44–1.00)
GFR calc Af Amer: >60 mL/min (ref >60)
GFR calc non Af Amer: >60 mL/min (ref >60)
GLUCOSE: 97 mg/dL (ref 65–99)
POTASSIUM: 3.9 mmol/L (ref 3.5–5.1)
Sodium: 140 mmol/L (ref 135–145)

## 2015-11-23 MED ORDER — TAMSULOSIN HCL 0.4 MG PO CAPS: 0.4000 mg | ORAL_CAPSULE | Freq: Every day | ORAL | Status: DC

## 2015-11-23 MED ORDER — KETOROLAC TROMETHAMINE 30 MG/ML IJ SOLN
30.0000 mg | Freq: Once | INTRAMUSCULAR | Status: AC
  Administered 2015-11-23: 30 mg via INTRAMUSCULAR
  Filled 2015-11-23: qty 1

## 2015-11-23 NOTE — Discharge Instructions (Signed)
Flank Pain °Flank pain refers to pain that is located on the side of the body between the upper abdomen and the back. The pain may occur over a short period of time (acute) or may be long-term or reoccurring (chronic). It may be mild or severe. Flank pain can be caused by many things. °CAUSES  °Some of the more common causes of flank pain include: °· Muscle strains.   °· Muscle spasms.   °· A disease of your spine (vertebral disk disease).   °· A lung infection (pneumonia).   °· Fluid around your lungs (pulmonary edema).   °· A kidney infection.   °· Kidney stones.   °· A very painful skin rash caused by the chickenpox virus (shingles).   °· Gallbladder disease.   °HOME CARE INSTRUCTIONS  °Home care will depend on the cause of your pain. In general, °· Rest as directed by your caregiver. °· Drink enough fluids to keep your urine clear or pale yellow. °· Only take over-the-counter or prescription medicines as directed by your caregiver. Some medicines may help relieve the pain. °· Tell your caregiver about any changes in your pain. °· Follow up with your caregiver as directed. °SEEK IMMEDIATE MEDICAL CARE IF:  °· Your pain is not controlled with medicine.   °· You have new or worsening symptoms. °· Your pain increases.   °· You have abdominal pain.   °· You have shortness of breath.   °· You have persistent nausea or vomiting.   °· You have swelling in your abdomen.   °· You feel faint or pass out.   °· You have blood in your urine. °· You have a fever or persistent symptoms for more than 2-3 days. °· You have a fever and your symptoms suddenly get worse. °MAKE SURE YOU:  °· Understand these instructions. °· Will watch your condition. °· Will get help right away if you are not doing well or get worse. °  °This information is not intended to replace advice given to you by your health care provider. Make sure you discuss any questions you have with your health care provider. °  °Document Released: 10/31/2005 Document  Revised: 06/03/2012 Document Reviewed: 04/23/2012 °Elsevier Interactive Patient Education ©2016 [employer] Inc[employer] ° °Kidney Stones °Kidney stones (urolithiasis) are deposits that form inside your kidneys. The intense pain is caused by the stone moving through the urinary tract. When the stone moves, the ureter goes into spasm around the stone. The stone is usually passed in the urine.  °CAUSES  °· A disorder that makes certain neck glands produce too much parathyroid hormone (primary hyperparathyroidism). °· A buildup of uric acid crystals, similar to gout in your joints. °· Narrowing (stricture) of the ureter. °· A kidney obstruction present at birth (congenital obstruction). °· Previous surgery on the kidney or ureters. °· Numerous kidney infections. °SYMPTOMS  °· Feeling sick to your stomach (nauseous). °· Throwing up (vomiting). °· Blood in the urine (hematuria). °· Pain that usually spreads (radiates) to the groin. °· Frequency or urgency of urination. °DIAGNOSIS  °· Taking a history and physical exam. °· Blood or urine tests. °· CT scan. °· Occasionally, an examination of the inside of the urinary bladder (cystoscopy) is performed. °TREATMENT  °· Observation. °· Increasing your fluid intake. °· Extracorporeal shock wave lithotripsy--This is a noninvasive procedure that uses shock waves to break up kidney stones. °· Surgery may be needed if you have severe pain or persistent obstruction. There are various surgical procedures. Most of the procedures are performed with the use of small instruments. Only small incisions are   needed to accommodate these instruments, so recovery time is minimized. °The size, location, and chemical composition are all important variables that will determine the proper choice of action for you. Talk to your health care provider to better understand your situation so that you will minimize the risk of injury to yourself and your kidney.  °HOME CARE INSTRUCTIONS  °· Drink enough water and  fluids to keep your urine clear or pale yellow. This will help you to pass the stone or stone fragments. °· Strain all urine through the provided strainer. Keep all particulate matter and stones for your health care provider to see. The stone causing the pain may be as small as a grain of salt. It is very important to use the strainer each and every time you pass your urine. The collection of your stone will allow your health care provider to analyze it and verify that a stone has actually passed. The stone analysis will often identify what you can do to reduce the incidence of recurrences. °· Only take over-the-counter or prescription medicines for pain, discomfort, or fever as directed by your health care provider. °· Keep all follow-up visits as told by your health care provider. This is important. °· Get follow-up X-rays if required. The absence of pain does not always mean that the stone has passed. It may have only stopped moving. If the urine remains completely obstructed, it can cause loss of kidney function or even complete destruction of the kidney. It is your responsibility to make sure X-rays and follow-ups are completed. Ultrasounds of the kidney can show blockages and the status of the kidney. Ultrasounds are not associated with any radiation and can be performed easily in a matter of minutes. °· Make changes to your daily diet as told by your health care provider. You may be told to: °¨ Limit the amount of salt that you eat. °¨ Eat 5 or more servings of fruits and vegetables each day. °¨ Limit the amount of meat, poultry, fish, and eggs that you eat. °· Collect a 24-hour urine sample as told by your health care provider. You may need to collect another urine sample every 6-12 months. °SEEK MEDICAL CARE IF: °· You experience pain that is progressive and unresponsive to any pain medicine you have been prescribed. °SEEK IMMEDIATE MEDICAL CARE IF:  °· Pain cannot be controlled with the prescribed  medicine. °· You have a fever or shaking chills. °· The severity or intensity of pain increases over 18 hours and is not relieved by pain medicine. °· You develop a new onset of abdominal pain. °· You feel faint or pass out. °· You are unable to urinate. °  °This information is not intended to replace advice given to you by your health care provider. Make sure you discuss any questions you have with your health care provider. °  °Document Released: 09/09/2005 Document Revised: 05/31/2015 Document Reviewed: 02/10/2013 °Elsevier Interactive Patient Education ©2016 Elsevier Inc. ° °

## 2015-11-23 NOTE — ED Notes (Signed)
Pt started with left flank pain, now bilateral lower abd pain, hx of kidney stones.

## 2015-11-23 NOTE — ED Notes (Signed)
Patient with no complaints at this time. Respirations even and unlabored. Skin warm/dry. Discharge instructions reviewed with patient at this time. Patient given opportunity to voice concerns/ask questions. Patient discharged at this time and left Emergency Department with steady gait.   

## 2015-11-23 NOTE — ED Notes (Signed)
Pt states right flank pain that began on Monday and now it has moved to the left side and groin, states hx of kidney stones, denies any blood in her urine

## 2015-11-23 NOTE — ED Provider Notes (Signed)
[facility] Emergency Department Provider Note  ____________________________________________  Time seen: Approximately 6:40 PM  I have reviewed the triage vital signs and the nursing notes.   HISTORY  Chief Complaint Flank Pain    HPI Stephanie Howe is a 55 y.o. female with a history of kidney stones and hypothyroidism who is presenting today with bilateral flank pain which is now shifted to the left. She said that the pain started Monday and has been intermittent and pressure-like. She says that the pain now is to the left back and radiating down to the lower abdomen. She denies any blood in her urine. She says she has had a stone about a year ago and was put on Flomax and passed it spontaneously. She says that this episode feels exactly like her past pain. She describes the pain as a 7 out of 10 at this time. Denies any chest pain or shortness of breath. No nausea or vomiting. No diarrhea.   Past Medical History  Diagnosis Date  . Hypertension   . COPD (chronic obstructive pulmonary disease) (HCC)   . Hypothyroidism   . Overweight   . Anemia   . Vitamin D deficiency disease   . Stone in kidney     Patient Active Problem List   Diagnosis Date Noted  . Hypertension   . COPD (chronic obstructive pulmonary disease) (HCC)   . Hypothyroidism   . Anemia   . Vitamin D deficiency disease   . Stone in kidney   . 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 THUMB PAIN, RIGHT 06/23/2008  . 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 Tonsillectomy  1984  . Abdominal hysterectomy  1994  . Colon surgery  2002    colon exploratory surgery    Current Outpatient Rx  Name  Route  Sig  Dispense  Refill  . aspirin EC 81 MG tablet   Oral   Take 81 mg by mouth daily.         Marland Kitchen levothyroxine (SYNTHROID, LEVOTHROID) 50 MCG tablet   Oral   Take 50 mcg by mouth daily before breakfast.         . triamterene-hydrochlorothiazide  (MAXZIDE-25) 37.5-25 MG tablet   Oral   Take 0.5 tablets by mouth daily.           Allergies Codeine and Penicillins  Family History  Problem Relation Age of Onset  . COPD Mother   . Hypertension Mother   . Pulmonary embolism Father   . Diabetes Father   . COPD Father   . Hypertension Brother     Social History Social History  Substance Use Topics  . Smoking status: Former Games developer  . Smokeless tobacco: Never Used  . Alcohol Use: No    Review of Systems Constitutional: No fever/chills Eyes: No visual changes. ENT: No sore throat. Cardiovascular: Denies chest pain. Respiratory: Denies shortness of breath. Gastrointestinal:  No nausea, no vomiting.  No diarrhea.  No constipation. Genitourinary: Negative for dysuria. Musculoskeletal: Left thoracolumbar pain.  Skin: Negative for rash. Neurological: Negative for headaches, focal weakness or numbness.  10-point ROS otherwise negative.  ____________________________________________   PHYSICAL EXAM:  VITAL SIGNS: ED Triage Vitals  Enc Vitals Group     BP 11/23/15 1621 162/88 mmHg     Pulse Rate 11/23/15 1621 83     Resp 11/23/15 1621 18     Temp 11/23/15 1621 98 F (36.7 C)     Temp Source  11/23/15 1621 Oral     SpO2 11/23/15 1621 95 %     Weight 11/23/15 1618 145 lb (65.772 kg)     Height 11/23/15 1618  (1.549 m)     Head Cir --      Peak Flow --      Pain Score 11/23/15 1619 7     Pain Loc --      Pain Edu? --      Excl. in GC? --     Constitutional: Alert and oriented. Well appearing and in no acute distress. Eyes: Conjunctivae are normal. PERRL. EOMI. Head: Atraumatic. Nose: No congestion/rhinnorhea. Mouth/Throat: Mucous membranes are moist.   Neck: No stridor.   Cardiovascular: Normal rate, regular rhythm. Grossly normal heart sounds.  Good peripheral circulation. Respiratory: Normal respiratory effort.  No retractions. Lungs CTAB. Gastrointestinal: Soft with very minimal left lower quadrant  tenderness. No rebound or guarding. No distention.  No CVA tenderness. Musculoskeletal: No lower extremity tenderness nor edema.  No joint effusions. Neurologic:  Normal speech and language. No gross focal neurologic deficits are appreciated. Skin:  Skin is warm, dry and intact. No rash noted. Psychiatric: Mood and affect are normal. Speech and behavior are normal.  ____________________________________________   LABS (all labs ordered are listed, but only abnormal results are displayed)  Labs Reviewed  URINALYSIS COMPLETEWITH MICROSCOPIC ([facility] ONLY) - Abnormal; Notable for the following:    Color, Urine COLORLESS (*)    APPearance CLEAR (*)    Specific Gravity, Urine 1.003 (*)    Squamous Epithelial / LPF 0-5 (*)    All other components within normal limits  BASIC METABOLIC PANEL  CBC   ____________________________________________  EKG   ____________________________________________  RADIOLOGY   ____________________________________________   PROCEDURES  ____________________________________________   INITIAL IMPRESSION / ASSESSMENT AND PLAN / ED COURSE  Pertinent labs & imaging results that were available during my care of the patient were reviewed by me and considered in my medical decision making (see chart for details).  Patient well appearing and with very reassuring lab workup. Because there is no blood in the urine and the patient is 55 years old originally had planned to scan with a CAT scan. However, the patient says that the last time she was in the emergency department she got 3000 Bill for imaging and wants to forego at this time. She is requesting just Flomax as this helped her last time. The patient has a very benign exam. It is possible that there are other etiologies that could've caused this presentation such as diverticulitis. We also do not know if there is any hydronephrosis but there is a normal kidney function and the pain is been ongoing since Monday.  The patient has denied any fever or chills. We discussed strict return precautions including any worsening of the pain or any other concerning symptoms especially fever or chills. The patient also does not one any pain meds that may make her drowsy. I will give her dose of Toradol here in the emergency department and will discharge her home with Flomax. I encouraged her to continue to use ibuprofen and Tylenol and to take plenty of water by mouth.  She understands the plan and is willing to comply. ____________________________________________   FINAL CLINICAL IMPRESSION(S) / ED DIAGNOSES  Flank pain. Kidney stone.    Myrna Blazer, MD 11/23/15 [phone]

## 2015-11-27 ENCOUNTER — Ambulatory Visit (INDEPENDENT_AMBULATORY_CARE_PROVIDER_SITE_OTHER): Payer: BLUE CROSS/BLUE SHIELD | Admitting: Family Medicine

## 2015-11-27 ENCOUNTER — Encounter: Payer: Self-pay | Admitting: Family Medicine

## 2015-11-27 VITALS — BP 156/89 | HR 97 | Temp 97.4°F | Ht 60.5 in | Wt 144.0 lb

## 2015-11-27 DIAGNOSIS — R234 Changes in skin texture: Secondary | ICD-10-CM | POA: Diagnosis not present

## 2015-11-27 DIAGNOSIS — M545 Low back pain, unspecified: Secondary | ICD-10-CM

## 2015-11-27 DIAGNOSIS — R8290 Unspecified abnormal findings in urine: Secondary | ICD-10-CM

## 2015-11-27 DIAGNOSIS — I10 Essential (primary) hypertension: Secondary | ICD-10-CM | POA: Diagnosis not present

## 2015-11-27 DIAGNOSIS — E038 Other specified hypothyroidism: Secondary | ICD-10-CM | POA: Diagnosis not present

## 2015-11-27 DIAGNOSIS — Z23 Encounter for immunization: Secondary | ICD-10-CM

## 2015-11-27 DIAGNOSIS — K625 Hemorrhage of anus and rectum: Secondary | ICD-10-CM

## 2015-11-27 MED ORDER — NAPROXEN 375 MG PO TABS: 375.0000 mg | ORAL_TABLET | Freq: Two times a day (BID) | ORAL | Status: DC

## 2015-11-27 MED ORDER — TRIAMTERENE-HCTZ 37.5-25 MG PO TABS: 0.5000 | ORAL_TABLET | Freq: Every day | ORAL | Status: DC

## 2015-11-27 NOTE — Progress Notes (Signed)
BP 156/89 mmHg  Pulse 97  Temp(Src) 97.4 F (36.3 C)  Ht 5' 0.5" (1.537 m)  Wt 144 lb (65.318 kg)  BMI 27.65 kg/m2  SpO2 96%   Subjective:    Patient ID: Stephanie Howe, female    DOB: April 13, 1961, 55 y.o.   MRN: [id number]  HPI: Stephanie Howe is a 55 y.o. female  Chief Complaint  Patient presents with  . Back Pain    she went to the ER they said they thought she might have kidney stones, but did no scan. She states it is getting better, but still hurts.   She went to the ER on Thursday for back pain, thought she had kidneys tones; ER note reviewed They did urine testing; no blood this time and in fact did not have blood last time she had stones She does not have a urologist They put her on flomax, having good urine ouptut No visible blood She did have a scan in 2015, ER doctor reviewed it but I personally couldn't find that; we found CT scan from 2003 which showed hiatal hernia She has been to see the [address]rologist [address] She says that she was putting up a blind one day and thought she pulled a muscle, hurting on the right side; got worse and worse, then the pain moved and went to the other side (left); then had pain down in the left hip area and then pain coming around; no B/B dysfunction when I asked, then remembers that she felt like she has to pee more often, no accidents, just a day of that; no burning No leg heaviness; no rash She used some heat and that was it; helped some No spasms left No fevers  They are trying to up [facility]; they also stopped her BP medicine at the [facility] BP was 165 in the ER; the doctor [facility] said it wasn't needed any more and that's why her BP medicine was stopped; no side effects or other problems with it  She gets fissures on the fingers; uses moisturizing lotion; wears gloves when cleaning; has dishwasher at home; does not wash dishes at work; not taking vitamin D3  She  sometimes has bright red blood with BMs, after straining; does have itching; she says it has been a while; she does NOT want a colonoscopy; refuses to let me put in referral  Relevant past medical, surgical, family and social history reviewed and updated as indicated. Interim medical history since our last visit reviewed. Allergies and medications reviewed and updated.  Review of Systems Per HPI unless specifically indicated above     Objective:    BP 156/89 mmHg  Pulse 97  Temp(Src) 97.4 F (36.3 C)  Ht 5' 0.5" (1.537 m)  Wt 144 lb (65.318 kg)  BMI 27.65 kg/m2  SpO2 96%  Wt Readings from Last 3 Encounters:  11/27/15 144 lb (65.318 kg)  11/23/15 145 lb (65.772 kg)  11/16/14 150 lb (68.04 kg)    Physical Exam  Constitutional: She appears well-developed and well-nourished.  Weight down 6 pounds over the last year  Cardiovascular: Normal rate and regular rhythm.   Pulmonary/Chest: Effort normal and breath sounds normal.  Abdominal: She exhibits no distension. There is no tenderness.  Psychiatric: She has a normal mood and affect.   Urine in-house: trace LE, no blood, few bacteria     Assessment & Plan:   Problem List  Items Addressed This Visit      Cardiovascular and Mediastinum   Hypertension    Blood pressure not controlled; DASH guidelines encouraged; see AVS; start back on antihypertensive; return in 4 weeks for f/u      Relevant Medications   triamterene-hydrochlorothiazide (MAXZIDE-25) 37.5-25 MG tablet     Digestive   Bright red rectal bleeding    encouraged patient to be evaluated, have colonoscopy; bleeding can be sign of hemorrhoids, fissures, but also cancer; she refuses right now to have this evaluated; we discussed this; while I respect her right to refuse evaluation and care, I needed her to know that this could be something serious like cancer; do not delay long, recommending she strongly consider having this done within the month; she will think about  it        Endocrine   Hypothyroidism    Adjusted by [facility] staff        Musculoskeletal and Integument   Skin fissures    May be secondary to vitamin D deficiency; suggested some replacement short-term, fat soluble vitamin so don't take high doses for long time; moisturizing lotions; avoid excessive hot water and chemicals       Other Visit Diagnoses    Left-sided low back pain without sciatica    -  Primary    no blood in urine today; she does want CT scan; will finish out flomax, hydrate; reasons to return to ER reviewed; if not resolving quickly, let me know    Relevant Medications    naproxen (NAPROSYN) 375 MG tablet    Other Relevant Orders    UA/M w/rflx Culture, Routine (Completed)    Abnormal urine odor        trace LE; culture pending to look for infection; call in antibiotic if needed    Relevant Orders    UA/M w/rflx Culture, Routine (Completed)    Need for Tdap vaccination        Relevant Orders    Tdap vaccine greater than or equal to 7yo IM (Completed)       Follow up plan: Return in about 4 weeks (around 12/25/2015) for at [facility] for blood pressure.   Meds ordered this encounter  Medications  . naproxen (NAPROSYN) 375 MG tablet    Sig: Take 1 tablet (375 mg total) by mouth 2 (two) times daily with a meal. Or milk or crackers; for back pain    Dispense:  30 tablet    Refill:  0  . triamterene-hydrochlorothiazide (MAXZIDE-25) 37.5-25 MG tablet    Sig: Take 0.5 tablets by mouth daily. Reported on 11/27/2015    Dispense:  15 tablet    Refill:  2   An after-visit summary was printed and given to the patient at check-out.  Please see the patient instructions which may contain other information and recommendations beyond what is mentioned above in the assessment and plan.

## 2015-11-27 NOTE — Patient Instructions (Addendum)
Your goal blood pressure is less than 140 mmHg on top and less than 90 mmHg Try to follow the DASH guidelines (DASH stands for Dietary Approaches to Stop Hypertension) Try to limit the sodium in your diet.  Ideally, consume less than 1.5 grams (less than 1,500mg ) per day. Do not add salt when cooking or at the table.  Check the sodium amount on labels when shopping, and choose items lower in sodium when given a choice. Avoid or limit foods that already contain a lot of sodium. Eat a diet rich in fruits and vegetables and whole grains.   Do stay hydrated Start back on the low dose blood pressure medicine Use the naproxen if needed for back discomfort If you need something for aches or pains, try to use Tylenol (acetaminphen) instead of non-steroidals (which include Aleve, ibuprofen, Advil, Motrin, and naproxen); non-steroidals  I do recommend a colonoscopy within a month, but respect your right to refuse Start colace over-the-counter to help prevent constipation Increase fiber in your diet Let me know when I can make that referral to a gastroenterologist for your bleeding  Start back on the once a week vitamin D for 4 weeks, then start vitamin D3 1,000 iu daily Keep hands out of hot water  You received the vaccine to protect against tetanus and diphtheria and pertussis today; the tetanus and diphtheria portions will provide protection up to ten years, and the pertussis component will give you protection against whooping cough for life   DASH Eating Plan DASH stands for "Dietary Approaches to Stop Hypertension." The DASH eating plan is a healthy eating plan that has been shown to reduce high blood pressure (hypertension). Additional health benefits may include reducing the risk of type 2 diabetes mellitus, heart disease, and stroke. The DASH eating plan may also help with weight loss. WHAT DO I NEED TO KNOW ABOUT THE DASH EATING PLAN? For the DASH eating plan, you will follow these general  guidelines:  Choose foods with a percent daily value for sodium of less than 5% (as listed on the food label).  Use salt-free seasonings or herbs instead of table salt or sea salt.  Check with your health care provider or pharmacist before using salt substitutes.  Eat lower-sodium products, often labeled as "lower sodium" or "no salt added."  Eat fresh foods.  Eat more vegetables, fruits, and low-fat dairy products.  Choose whole grains. Look for the word "whole" as the first word in the ingredient list.  Choose fish and skinless chicken or [place] more often than red meat. Limit fish, poultry, and meat to 6 oz (170 g) each day.  Limit sweets, desserts, sugars, and sugary drinks.  Choose heart-healthy fats.  Limit cheese to 1 oz (28 g) per day.  Eat more home-cooked food and less restaurant, buffet, and fast food.  Limit fried foods.  Cook foods using methods other than frying.  Limit canned vegetables. If you do use them, rinse them well to decrease the sodium.  When eating at a restaurant, ask that your food be prepared with less salt, or no salt if possible. WHAT FOODS CAN I EAT? Seek help from a dietitian for individual calorie needs. Grains Whole grain or whole wheat bread. Brown rice. Whole grain or whole wheat pasta. Quinoa, bulgur, and whole grain cereals. Low-sodium cereals. Corn or whole wheat flour tortillas. Whole grain cornbread. Whole grain crackers. Low-sodium crackers. Vegetables Fresh or frozen vegetables (raw, steamed, roasted, or grilled). Low-sodium or reduced-sodium tomato and vegetable  juices. Low-sodium or reduced-sodium tomato sauce and paste. Low-sodium or reduced-sodium canned vegetables.  Fruits All fresh, canned (in natural juice), or frozen fruits. Meat and Other Protein Products Ground beef (85% or leaner), grass-fed beef, or beef trimmed of fat. Skinless chicken or [place]. Ground chicken or [place]. Pork trimmed of fat. All fish and seafood.  Eggs. Dried beans, peas, or lentils. Unsalted nuts and seeds. Unsalted canned beans. Dairy Low-fat dairy products, such as skim or 1% milk, 2% or reduced-fat cheeses, low-fat ricotta or cottage cheese, or plain low-fat yogurt. Low-sodium or reduced-sodium cheeses. Fats and Oils Tub margarines without trans fats. Light or reduced-fat mayonnaise and salad dressings (reduced sodium). Avocado. Safflower, olive, or canola oils. Natural peanut or almond butter. Other Unsalted popcorn and pretzels. The items listed above may not be a complete list of recommended foods or beverages. Contact your dietitian for more options. WHAT FOODS ARE NOT RECOMMENDED? Grains White bread. White pasta. White rice. Refined cornbread. Bagels and croissants. Crackers that contain trans fat. Vegetables Creamed or fried vegetables. Vegetables in a cheese sauce. Regular canned vegetables. Regular canned tomato sauce and paste. Regular tomato and vegetable juices. Fruits Dried fruits. Canned fruit in light or heavy syrup. Fruit juice. Meat and Other Protein Products Fatty cuts of meat. Ribs, chicken wings, bacon, sausage, bologna, salami, chitterlings, fatback, hot dogs, bratwurst, and packaged luncheon meats. Salted nuts and seeds. Canned beans with salt. Dairy Whole or 2% milk, cream, half-and-half, and cream cheese. Whole-fat or sweetened yogurt. Full-fat cheeses or blue cheese. Nondairy creamers and whipped toppings. Processed cheese, cheese spreads, or cheese curds. Condiments Onion and garlic salt, seasoned salt, table salt, and sea salt. Canned and packaged gravies. Worcestershire sauce. Tartar sauce. Barbecue sauce. Teriyaki sauce. Soy sauce, including reduced sodium. Steak sauce. Fish sauce. Oyster sauce. Cocktail sauce. Horseradish. Ketchup and mustard. Meat flavorings and tenderizers. Bouillon cubes. Hot sauce. Tabasco sauce. Marinades. Taco seasonings. Relishes. Fats and Oils Butter, stick margarine, lard,  shortening, ghee, and bacon fat. Coconut, palm kernel, or palm oils. Regular salad dressings. Other Pickles and olives. Salted popcorn and pretzels. The items listed above may not be a complete list of foods and beverages to avoid. Contact your dietitian for more information. WHERE CAN I FIND MORE INFORMATION? National Heart, Lung, and Blood Institute: CableProm[employer].itwww.nhlbi.nih.gov/health/health-topics/topics/dash/   This information is not intended to replace advice given to you by your health care provider. Make sure you discuss any questions you have with your health care provider.   Document Released: 08/29/2011 Document Revised: 09/30/2014 Document Reviewed: 07/14/2013 Elsevier Interactive Patient Education [employer]2016 Elsevier Inc.

## 2015-11-29 ENCOUNTER — Telehealth: Payer: Self-pay | Admitting: Family Medicine

## 2015-11-29 DIAGNOSIS — K625 Hemorrhage of anus and rectum: Secondary | ICD-10-CM

## 2015-11-29 DIAGNOSIS — R234 Changes in skin texture: Secondary | ICD-10-CM | POA: Insufficient documentation

## 2015-11-29 LAB — UA/M W/RFLX CULTURE, ROUTINE
BILIRUBIN UA: NEGATIVE
GLUCOSE, UA: NEGATIVE
KETONES UA: NEGATIVE
Nitrite, UA: NEGATIVE
PROTEIN UA: NEGATIVE
RBC, UA: NEGATIVE
SPEC GRAV UA: 1.025 (ref 1.005–1.030)
Urobilinogen, Ur: 0.2 mg/dL (ref 0.2–1.0)
pH, UA: 5 (ref 5.0–7.5)

## 2015-11-29 LAB — MICROSCOPIC EXAMINATION: RBC, UA: NONE SEEN /hpf (ref 0–?)

## 2015-11-29 LAB — URINE CULTURE, REFLEX

## 2015-11-29 MED ORDER — NITROFURANTOIN MONOHYD MACRO 100 MG PO CAPS: 100.0000 mg | ORAL_CAPSULE | Freq: Two times a day (BID) | ORAL | Status: AC

## 2015-11-29 NOTE — Assessment & Plan Note (Signed)
Adjusted by [facility] staff

## 2015-11-29 NOTE — Assessment & Plan Note (Signed)
May be secondary to vitamin D deficiency; suggested some replacement short-term, fat soluble vitamin so don't take high doses for long time; moisturizing lotions; avoid excessive hot water and chemicals

## 2015-11-29 NOTE — Assessment & Plan Note (Addendum)
Blood pressure not controlled; DASH guidelines encouraged; see AVS; start back on antihypertensive; return in 4 weeks for f/u

## 2015-11-29 NOTE — Assessment & Plan Note (Signed)
She refuses colonoscopy, GI referral; she is willing to have cologuard testing; I explained this should not give her a false sense of security, but may sway her to get full colonoscopy if cancerous cells discovered; she is agreeable to the home test

## 2015-11-29 NOTE — Telephone Encounter (Signed)
See under A/P for rectal bleeding; cologuard ordered; not gold standard; should not give her false sense of security; if cancer cells show, then she definitely will want to get checked out; she agrees; advised her to call if she has not heard anything in one week Having some pressure, urine results reviewed; sending in macrobid; allergies reviewed

## 2015-11-29 NOTE — Assessment & Plan Note (Signed)
encouraged patient to be evaluated, have colonoscopy; bleeding can be sign of hemorrhoids, fissures, but also cancer; she refuses right now to have this evaluated; we discussed this; while I respect her right to refuse evaluation and care, I needed her to know that this could be something serious like cancer; do not delay long, recommending she strongly consider having this done within the month; she will think about it

## 2015-12-18 LAB — COLOGUARD: COLOGUARD: NEGATIVE

## 2015-12-27 ENCOUNTER — Other Ambulatory Visit
Admission: RE | Admit: 2015-12-27 | Discharge: 2015-12-27 | Disposition: A | Discharge status: Home / Self Care | Payer: BLUE CROSS/BLUE SHIELD | Source: Ambulatory Visit | Attending: Family Medicine | Admitting: Family Medicine

## 2015-12-27 ENCOUNTER — Encounter: Payer: Self-pay | Admitting: Family Medicine

## 2015-12-27 ENCOUNTER — Ambulatory Visit (INDEPENDENT_AMBULATORY_CARE_PROVIDER_SITE_OTHER): Payer: BLUE CROSS/BLUE SHIELD | Admitting: Family Medicine

## 2015-12-27 ENCOUNTER — Telehealth: Payer: Self-pay | Admitting: Family Medicine

## 2015-12-27 VITALS — BP 138/86 | HR 74 | Temp 98.5°F | Resp 14 | Wt 143.0 lb

## 2015-12-27 DIAGNOSIS — Z5181 Encounter for therapeutic drug level monitoring: Secondary | ICD-10-CM

## 2015-12-27 DIAGNOSIS — E559 Vitamin D deficiency, unspecified: Secondary | ICD-10-CM

## 2015-12-27 DIAGNOSIS — K625 Hemorrhage of anus and rectum: Secondary | ICD-10-CM

## 2015-12-27 DIAGNOSIS — E038 Other specified hypothyroidism: Secondary | ICD-10-CM

## 2015-12-27 DIAGNOSIS — E785 Hyperlipidemia, unspecified: Secondary | ICD-10-CM | POA: Insufficient documentation

## 2015-12-27 DIAGNOSIS — S99912D Unspecified injury of left ankle, subsequent encounter: Secondary | ICD-10-CM | POA: Diagnosis not present

## 2015-12-27 LAB — BASIC METABOLIC PANEL
ANION GAP: 8 (ref 5–15)
BUN: 13 mg/dL (ref 6–20)
CHLORIDE: 98 mmol/L — AB (ref 101–111)
CO2: 26 mmol/L (ref 22–32)
CREATININE: 0.74 mg/dL (ref 0.44–1.00)
Calcium: 9.2 mg/dL (ref 8.9–10.3)
GFR calc non Af Amer: >60 mL/min (ref >60)
Glucose, Bld: 88 mg/dL (ref 65–99)
POTASSIUM: 3.6 mmol/L (ref 3.5–5.1)
SODIUM: 132 mmol/L — AB (ref 135–145)

## 2015-12-27 LAB — ALT: ALT: 15 U/L (ref 14–54)

## 2015-12-27 LAB — LIPID PANEL
CHOL/HDL RATIO: 2.4 ratio
CHOLESTEROL: 197 mg/dL (ref 0–200)
HDL: 82 mg/dL (ref >40)
LDL CALC: 98 mg/dL (ref 0–99)
TRIGLYCERIDES: 86 mg/dL (ref <150)
VLDL: 17 mg/dL (ref 0–40)

## 2015-12-27 LAB — TSH: TSH: 4.218 u[IU]/mL (ref 0.350–4.500)

## 2015-12-27 MED ORDER — AIRCAST SPORT ANKLE BRACE/LEFT MISC: Status: DC

## 2015-12-27 MED ORDER — AMLODIPINE BESYLATE 5 MG PO TABS: 5.0000 mg | ORAL_TABLET | Freq: Every day | ORAL | Status: DC

## 2015-12-27 MED ORDER — PANTOPRAZOLE SODIUM 40 MG PO TBEC: 40.0000 mg | DELAYED_RELEASE_TABLET | Freq: Every day | ORAL | Status: DC

## 2015-12-27 NOTE — Assessment & Plan Note (Signed)
She sent the Cologuard test off; just a little when wiping; no blood in the stool; she declines recommendation for colonoscopy

## 2015-12-27 NOTE — Assessment & Plan Note (Signed)
Start on the RX vit D, take weekly for 8 weeks; after that, then 1000 iu daily

## 2015-12-27 NOTE — Progress Notes (Signed)
BP 138/86 mmHg  Pulse 74  Temp(Src) 98.5 F (36.9 C) (Oral)  Resp 14  Wt 143 lb (64.864 kg)  SpO2 93%   Subjective:    Patient ID: Stephanie Howe, female    DOB: Aug 14, 1961, 55 y.o.   MRN: [id number]  HPI: Stephanie Howe is a 55 y.o. female  Chief Complaint  Patient presents with  . Medication Refill  . Hypertension  . Hypothyroidism  . Foot Pain    onset 1 month left, states she twisted it and is not getting any better.  Only hnurts when she applies pressure   She has been getting 138/80 at home, that is with bicep cuff; 128 here when CMA checked; patient asked me to recheck; 138/86 here on the recheck by MD; does not eat that much salt; eats two meals a day, not a breakfast eater; no decongestants  She went to stand up and her foot twisted over, rolled it over; one month ago; left foot/ankle; pain still; had a torn ligament in the right foot; was in cast for 6 weeks; does not need anything for pain she says  Thyroid; energy level is okay; same dose  Heartburn has gotten really bad; worse with certain foods, chocolate; does not drink lots of coffee, less than before, just one cup a day; some onions and those are triggers; has tried something from her brother, Nexium, helped a little bit; sister had some Protonix and it really helped  Used to have high cholesterol; was a "walking time bomb" because her lipids were really high; brother has high cholesterol too; might eat ham sandwich and chips for lunch, chicken breast with cucumbers and tomato; two hot dogs and chips for dinner  Will start taking vitamin D Rx once a week, has picked up the pills, but has not started it yet  Relevant past medical, surgical, family and social history reviewed and updated as indicated. Interim medical history since our last visit reviewed. Allergies and medications reviewed and updated.  Review of Systems Per HPI unless specifically indicated above     Objective:    BP 138/86 mmHg  Pulse  74  Temp(Src) 98.5 F (36.9 C) (Oral)  Resp 14  Wt 143 lb (64.864 kg)  SpO2 93%  Wt Readings from Last 3 Encounters:  12/27/15 143 lb (64.864 kg)  11/27/15 144 lb (65.318 kg)  11/23/15 145 lb (65.772 kg)    Physical Exam  Constitutional: She appears well-developed and well-nourished. No distress.  Eyes: No scleral icterus.  Neck: No JVD present.  Cardiovascular: Normal rate and regular rhythm.   Pulses:      Dorsalis pedis pulses are 2+ on the left side.  Left foot warm and well-perfused  Pulmonary/Chest: Effort normal and breath sounds normal.  Abdominal: She exhibits no distension.  Musculoskeletal: She exhibits no edema.       Left ankle: Tenderness.  Tender over the tuberosity of the 5th metatarsal bone on the left, inferior peroneal retinaculum, and peroneus brevis tendon on the LEFT  Neurological: She is alert. She displays no tremor.  No foot drop on the left  Skin: Skin is warm. No bruising noted. No pallor.  No bruising or contusion noted over the lateral left foot or ankle  Psychiatric: She has a normal mood and affect.   Results for orders placed or performed in visit on 11/27/15  Microscopic Examination  Result Value Ref Range   WBC, UA 0-5 0 -  5 /hpf  RBC, UA None seen 0 -  2 /hpf   Epithelial Cells (non renal) 0-10 0 - 10 /hpf   Bacteria, UA Few None seen/Few  UA/M w/rflx Culture, Routine  Result Value Ref Range   Specific Gravity, UA 1.025 1.005 - 1.030   pH, UA 5.0 5.0 - 7.5   Color, UA Yellow Yellow   Appearance Ur Clear Clear   Leukocytes, UA Trace (A) Negative   Protein, UA Negative Negative/Trace   Glucose, UA Negative Negative   Ketones, UA Negative Negative   RBC, UA Negative Negative   Bilirubin, UA Negative Negative   Urobilinogen, Ur 0.2 0.2 - 1.0 mg/dL   Nitrite, UA Negative Negative   Microscopic Examination See below:    Urinalysis Reflex Comment   Urine Culture, Routine  Result Value Ref Range   Urine Culture, Routine Final report     Urine Culture result 1 Comment       Assessment & Plan:   Problem List Items Addressed This Visit      Digestive   Bright red rectal bleeding    She sent the Cologuard test off; just a little when wiping; no blood in the stool; she declines recommendation for colonoscopy        Endocrine   Hypothyroidism    Check TSH today      Relevant Orders   TSH     Other   Vitamin D deficiency disease    Start on the RX vit D, take weekly for 8 weeks; after that, then 1000 iu daily      Medication monitoring encounter   Relevant Orders   Basic metabolic panel   ALT   Hyperlipidemia   Relevant Orders   Lipid Panel w/o Chol/HDL Ratio    Other Visit Diagnoses    Left ankle injury, subsequent encounter    -  Primary    Relevant Orders    Ambulatory referral to Podiatry        Follow up plan: Return in about 6 months (around 06/27/2016) for regular follow-up and physical, 40 minute visit.  Meds ordered this encounter  Medications  . Elastic Bandages & Supports (AIRCAST SPORT ANKLE BRACE/LEFT) MISC    Sig: Wear on left foot/ankle when up on feet, walking    Dispense:  1 each    Refill:  0  . pantoprazole (PROTONIX) 40 MG tablet    Sig: Take 1 tablet (40 mg total) by mouth daily.    Dispense:  30 tablet    Refill:  2   Orders Placed This Encounter  Procedures  . Basic metabolic panel  . Lipid Panel w/o Chol/HDL Ratio  . TSH  . ALT  . Ambulatory referral to Podiatry   An after-visit summary was printed and given to the patient at check-out.  Please see the patient instructions which may contain other information and recommendations beyond what is mentioned above in the assessment and plan.

## 2015-12-27 NOTE — Patient Instructions (Addendum)
Take the prescription vitamin D once a week for 8 weeks, then start OTC vitamin D3 1000 iu daily We'll check labs today I've referred you to a podiatrist We'll await the cologuard results If you have not heard anything from my staff in a week about any orders/referrals/studies from today, please contact us here to follow-up [phone] Try to limit saturated fats in your diet (bologna, hot dogs, barbeque, cheeseburgers, hamburgers, steak, bacon, sausage, cheese, etc.) and get more fresh fruits, vegetables, and whole grains Use the air cast on the left foot/ankle and we'll have you see the podiatrist Your goal blood pressure is less than 140 mmHg on top. Try to follow the DASH guidelines (DASH stands for Dietary Approaches to Stop Hypertension) Try to limit the sodium in your diet.  Ideally, consume less than 1.5 grams (less than 1,500mg ) per day. Do not add salt when cooking or at the table.  Check the sodium amount on labels when shopping, and choose items lower in sodium when given a choice. Avoid or limit foods that already contain a lot of sodium. Eat a diet rich in fruits and vegetables and whole grains.

## 2015-12-27 NOTE — Assessment & Plan Note (Signed)
Check TSH today

## 2015-12-27 NOTE — Telephone Encounter (Signed)
I talked with patient about labs; hdl excellent; no need for lipid lowering agent; thyroid fine; liver fine; Na+ and Cl- are low, likely from medicine, so STOP triam/hctz and start amlodipine 5 mg daily; discussed possible side effects like leg edema and constipation; monitor BP at home and call if needed, if trending up

## 2016-01-09 ENCOUNTER — Telehealth: Payer: Self-pay | Admitting: Family Medicine

## 2016-01-09 NOTE — Telephone Encounter (Signed)
Pt asking for results of lab test that had to be sent off. 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

## 2016-01-09 NOTE — Telephone Encounter (Signed)
Patient called would like results of labs, I don't see that they have been interpreted yet?

## 2016-01-11 NOTE — Telephone Encounter (Signed)
If she is talking about Cologuard, that test was negative Good news! If she is thinking of some other blood tests, we already talked about those

## 2016-01-11 NOTE — Telephone Encounter (Signed)
Please in interrupt labs, and I can review with patient

## 2016-01-11 NOTE — Telephone Encounter (Signed)
Pt would like a call back please. °

## 2016-01-12 NOTE — Telephone Encounter (Signed)
Yes, Pt notified

## 2016-01-25 ENCOUNTER — Ambulatory Visit: Payer: Self-pay | Admitting: Podiatry

## 2016-03-27 ENCOUNTER — Other Ambulatory Visit: Payer: Self-pay | Admitting: Family Medicine

## 2016-03-27 DIAGNOSIS — Z1231 Encounter for screening mammogram for malignant neoplasm of breast: Secondary | ICD-10-CM

## 2016-04-04 ENCOUNTER — Ambulatory Visit: Payer: Self-pay | Admitting: Ophthalmology

## 2016-04-24 ENCOUNTER — Other Ambulatory Visit: Payer: Self-pay

## 2016-05-01 ENCOUNTER — Ambulatory Visit: Payer: Self-pay | Admitting: Internal Medicine

## 2016-05-02 ENCOUNTER — Ambulatory Visit: Payer: BLUE CROSS/BLUE SHIELD

## 2016-05-06 ENCOUNTER — Other Ambulatory Visit: Payer: Self-pay | Admitting: Family Medicine

## 2016-05-06 ENCOUNTER — Ambulatory Visit
Admission: RE | Admit: 2016-05-06 | Discharge: 2016-05-06 | Disposition: A | Discharge status: Home / Self Care | Payer: BLUE CROSS/BLUE SHIELD | Source: Ambulatory Visit | Attending: Family Medicine | Admitting: Family Medicine

## 2016-05-06 DIAGNOSIS — Z1231 Encounter for screening mammogram for malignant neoplasm of breast: Secondary | ICD-10-CM | POA: Diagnosis not present

## 2016-05-17 ENCOUNTER — Other Ambulatory Visit: Payer: Self-pay | Admitting: Family Medicine

## 2016-05-17 NOTE — Telephone Encounter (Signed)
Sent with cautions 

## 2016-05-31 ENCOUNTER — Ambulatory Visit
Admission: RE | Admit: 2016-05-31 | Discharge: 2016-05-31 | Disposition: A | Discharge status: Home / Self Care | Payer: BLUE CROSS/BLUE SHIELD | Source: Ambulatory Visit | Attending: Family Medicine | Admitting: Family Medicine

## 2016-05-31 ENCOUNTER — Encounter: Payer: Self-pay | Admitting: Family Medicine

## 2016-05-31 ENCOUNTER — Ambulatory Visit (INDEPENDENT_AMBULATORY_CARE_PROVIDER_SITE_OTHER): Payer: BLUE CROSS/BLUE SHIELD | Admitting: Family Medicine

## 2016-05-31 VITALS — BP 122/78 | HR 98 | Temp 98.5°F | Resp 16 | Wt 137.6 lb

## 2016-05-31 DIAGNOSIS — R059 Cough, unspecified: Secondary | ICD-10-CM

## 2016-05-31 DIAGNOSIS — R05 Cough: Secondary | ICD-10-CM | POA: Diagnosis not present

## 2016-05-31 DIAGNOSIS — J0100 Acute maxillary sinusitis, unspecified: Secondary | ICD-10-CM

## 2016-05-31 DIAGNOSIS — J449 Chronic obstructive pulmonary disease, unspecified: Secondary | ICD-10-CM | POA: Diagnosis not present

## 2016-05-31 MED ORDER — AZITHROMYCIN 250 MG PO TABS: ORAL_TABLET | ORAL | 0 refills | Status: AC

## 2016-05-31 MED ORDER — ALBUTEROL SULFATE (2.5 MG/3ML) 0.083% IN NEBU: 2.5000 mg | INHALATION_SOLUTION | RESPIRATORY_TRACT | 1 refills | Status: DC | PRN

## 2016-05-31 MED ORDER — BENZONATATE 100 MG PO CAPS: 100.0000 mg | ORAL_CAPSULE | Freq: Three times a day (TID) | ORAL | 0 refills | Status: AC | PRN

## 2016-05-31 NOTE — Assessment & Plan Note (Addendum)
Check CXR; start neb albuterol; rest and hydration; contagious, out of work x 2 days; she has had pneumonia vaccine; tessalon perles for cough; reasons to seek care over weekend reviewed

## 2016-05-31 NOTE — Progress Notes (Signed)
BP 122/78   Pulse 98   Temp 98.5 F (36.9 C) (Oral)   Resp 16   Wt 137 lb 9.6 oz (62.4 kg)   SpO2 94%   BMI 26.43 kg/m    Subjective:    Patient ID: Stephanie Howe, female    DOB: [birth year], 55 y.o.   MRN: [id number]  HPI: Stephanie Howe is a 55 y.o. female  Chief Complaint  Patient presents with  . URI    1 week cough,runny nose, green mocus,   She has been sick for a week; started in head and chest; very congested in the head, nose running like crazy; pressure behind the eyes; gets really bad sinus infections; in the chest too; has had pneumonia in the past; little bit of wheezing; cough constantly and keeps her up  Used dayquil and nyquil; nyquil worked well for one night, dayquil didn't work at all No known sick contacts Does not have an inhaler, but has a breathing machine and she would prefer the neb solution She did receive the pneumonia vaccine PPSV-23 in 2015  Depression screen PHQ 2/9 05/31/2016 12/27/2015  Decreased Interest 0 0  Down, Depressed, Hopeless 0 0  PHQ - 2 Score 0 0   Relevant past medical, surgical, family and social history reviewed Past Medical History:  Diagnosis Date  . Anemia   . COPD (chronic obstructive pulmonary disease) (HCC)   . Hypertension   . Hypothyroidism   . Overweight   . Stone in kidney   . Vitamin D deficiency disease    Past Surgical History:  Procedure Laterality Date  . ABDOMINAL HYSTERECTOMY  1994  . BREAST EXCISIONAL BIOPSY Left 10+yrs ago   neg  . COLON SURGERY  2002   colon exploratory surgery  . TONSILLECTOMY  1984   Social History  Substance Use Topics  . Smoking status: Former Smoker    Packs/day: 1.50    Years: 30.00    Types: Cigarettes    Quit date: 09/24/2011  . Smokeless tobacco: Never Used  . Alcohol use No   Interim medical history since last visit reviewed. Allergies and medications reviewed  Review of Systems Per HPI unless specifically indicated above     Objective:    BP 122/78    Pulse 98   Temp 98.5 F (36.9 C) (Oral)   Resp 16   Wt 137 lb 9.6 oz (62.4 kg)   SpO2 94%   BMI 26.43 kg/m   Wt Readings from Last 3 Encounters:  05/31/16 137 lb 9.6 oz (62.4 kg)  10/25/15 146 lb (66.2 kg)  12/27/15 143 lb (64.9 kg)    Physical Exam  Constitutional: She appears well-developed and well-nourished.  Non-toxic appearance. She appears ill (coughing, appears to not feel well, wearing mask). No distress.  HENT:  Head: Normocephalic and atraumatic.  Right Ear: Hearing, tympanic membrane, external ear and ear canal normal. No drainage. Tympanic membrane is not erythematous. No middle ear effusion.  Left Ear: Hearing, tympanic membrane, external ear and ear canal normal. No drainage. Tympanic membrane is not erythematous.  No middle ear effusion.  Nose: Mucosal edema and rhinorrhea present. Right sinus exhibits maxillary sinus tenderness. Right sinus exhibits no frontal sinus tenderness. Left sinus exhibits maxillary sinus tenderness. Left sinus exhibits no frontal sinus tenderness.  Mouth/Throat: Oropharynx is clear and moist and mucous membranes are normal. No oropharyngeal exudate, posterior oropharyngeal edema or posterior oropharyngeal erythema.  Cardiovascular: Normal rate and regular rhythm.   No extrasystoles  are present.  Pulmonary/Chest: Effort normal. No accessory muscle usage. No respiratory distress. She has no decreased breath sounds. She has wheezes. She has rhonchi (scattered throughout). She has no rales.  Lymphadenopathy:    She has cervical adenopathy (shoddy).  Skin: No rash noted. She is not diaphoretic. No pallor.  Psychiatric: Her mood appears not anxious. She does not exhibit a depressed mood.      Assessment & Plan:   Problem List Items Addressed This Visit      Other   Cough    Check CXR; start neb albuterol; rest and hydration; contagious, out of work x 2 days; she has had pneumonia vaccine; tessalon perles for cough; reasons to seek care over  weekend reviewed      Relevant Orders   DG Chest 2 View    Other Visit Diagnoses    Acute maxillary sinusitis, recurrence not specified    -  Primary   start antibiotic; rest, hydration; discussed risk of C diff; see AVS   Relevant Medications   azithromycin (ZITHROMAX) 250 MG tablet   benzonatate (TESSALON PERLES) 100 MG capsule      Follow up plan: No Follow-up on file.  An after-visit summary was printed and given to the patient at check-out.  Please see the patient instructions which may contain other information and recommendations beyond what is mentioned above in the assessment and plan.  Meds ordered this encounter  Medications  . azithromycin (ZITHROMAX) 250 MG tablet    Sig: Take two pills by mouth today, then one pill daily for four more days    Dispense:  6 tablet    Refill:  0  . albuterol (PROVENTIL) (2.5 MG/3ML) 0.083% nebulizer solution    Sig: Take 3 mLs (2.5 mg total) by nebulization every 4 (four) hours as needed for wheezing or shortness of breath.    Dispense:  50 vial    Refill:  1  . benzonatate (TESSALON PERLES) 100 MG capsule    Sig: Take 1 capsule (100 mg total) by mouth every 8 (eight) hours as needed for cough.    Dispense:  30 capsule    Refill:  0    Orders Placed This Encounter  Procedures  . DG Chest 2 View

## 2016-05-31 NOTE — Patient Instructions (Signed)
Please go from here to the xray center across the street Start the antibiotics Please do eat yogurt daily or take a probiotic daily for the next month or two We want to replace the healthy germs in the gut If you notice foul, watery diarrhea in the next two months, schedule an appointment RIGHT AWAY  Out of work today and tomorrow You are contagious, so take care to stay away from other people Call if needed, or go to urgent care over the weekend if worsening

## 2016-06-03 ENCOUNTER — Ambulatory Visit: Payer: BLUE CROSS/BLUE SHIELD | Admitting: Family Medicine

## 2016-07-03 ENCOUNTER — Other Ambulatory Visit: Payer: Self-pay | Admitting: Family Medicine

## 2016-07-03 ENCOUNTER — Encounter: Payer: Self-pay | Admitting: Family Medicine

## 2016-07-03 ENCOUNTER — Ambulatory Visit (INDEPENDENT_AMBULATORY_CARE_PROVIDER_SITE_OTHER): Payer: BLUE CROSS/BLUE SHIELD | Admitting: Family Medicine

## 2016-07-03 VITALS — BP 154/80 | HR 85 | Temp 98.6°F | Resp 14 | Wt 137.0 lb

## 2016-07-03 DIAGNOSIS — Z0000 Encounter for general adult medical examination without abnormal findings: Secondary | ICD-10-CM | POA: Diagnosis not present

## 2016-07-03 DIAGNOSIS — I10 Essential (primary) hypertension: Secondary | ICD-10-CM | POA: Diagnosis not present

## 2016-07-03 DIAGNOSIS — Z87891 Personal history of nicotine dependence: Secondary | ICD-10-CM

## 2016-07-03 DIAGNOSIS — E559 Vitamin D deficiency, unspecified: Secondary | ICD-10-CM

## 2016-07-03 DIAGNOSIS — R12 Heartburn: Secondary | ICD-10-CM

## 2016-07-03 DIAGNOSIS — E038 Other specified hypothyroidism: Secondary | ICD-10-CM

## 2016-07-03 DIAGNOSIS — E28319 Asymptomatic premature menopause: Secondary | ICD-10-CM | POA: Insufficient documentation

## 2016-07-03 HISTORY — DX: Heartburn: R12

## 2016-07-03 LAB — CBC WITH DIFFERENTIAL/PLATELET
BASOS PCT: 1 %
Basophils Absolute: 80 cells/uL (ref 0–200)
EOS ABS: 160 {cells}/uL (ref 15–500)
Eosinophils Relative: 2 %
HEMATOCRIT: 40.9 % (ref 35.0–45.0)
Hemoglobin: 13.3 g/dL (ref 11.7–15.5)
LYMPHS PCT: 19 %
Lymphs Abs: 1520 cells/uL (ref 850–3900)
MCH: 26.6 pg — ABNORMAL LOW (ref 27.0–33.0)
MCHC: 32.5 g/dL (ref 32.0–36.0)
MCV: 81.8 fL (ref 80.0–100.0)
MONO ABS: 480 {cells}/uL (ref 200–950)
MPV: 8.8 fL (ref 7.5–12.5)
Monocytes Relative: 6 %
NEUTROS PCT: 72 %
Neutro Abs: 5760 cells/uL (ref 1500–7800)
Platelets: 338 10*3/uL (ref 140–400)
RBC: 5 MIL/uL (ref 3.80–5.10)
RDW: 15.3 % — AB (ref 11.0–15.0)
WBC: 8 10*3/uL (ref 3.8–10.8)

## 2016-07-03 LAB — LIPID PANEL
CHOLESTEROL: 187 mg/dL (ref 125–200)
HDL: 82 mg/dL (ref >=46)
LDL Cholesterol: 90 mg/dL (ref <130)
TRIGLYCERIDES: 77 mg/dL (ref <150)
Total CHOL/HDL Ratio: 2.3 Ratio (ref <=5.0)
VLDL: 15 mg/dL (ref <30)

## 2016-07-03 LAB — COMPLETE METABOLIC PANEL WITH GFR
ALT: 10 U/L (ref 6–29)
AST: 18 U/L (ref 10–35)
Albumin: 4.4 g/dL (ref 3.6–5.1)
Alkaline Phosphatase: 75 U/L (ref 33–130)
BUN: 10 mg/dL (ref 7–25)
CALCIUM: 9.9 mg/dL (ref 8.6–10.4)
CHLORIDE: 105 mmol/L (ref 98–110)
CO2: 24 mmol/L (ref 20–31)
Creat: 0.81 mg/dL (ref 0.50–1.05)
GFR, EST NON AFRICAN AMERICAN: 81 mL/min (ref >=60)
Glucose, Bld: 88 mg/dL (ref 65–99)
POTASSIUM: 4.6 mmol/L (ref 3.5–5.3)
Sodium: 140 mmol/L (ref 135–146)
Total Bilirubin: 0.4 mg/dL (ref 0.2–1.2)
Total Protein: 7.2 g/dL (ref 6.1–8.1)

## 2016-07-03 LAB — TSH: TSH: 3.07 m[IU]/L

## 2016-07-03 MED ORDER — LEVOTHYROXINE SODIUM 50 MCG PO TABS: ORAL_TABLET | ORAL | 6 refills | Status: DC

## 2016-07-03 MED ORDER — RANITIDINE HCL 300 MG PO CAPS: 300.0000 mg | ORAL_CAPSULE | Freq: Every evening | ORAL | 11 refills | Status: DC

## 2016-07-03 NOTE — Assessment & Plan Note (Signed)
USPSTF grade A and B recommendations reviewed with patient; age-appropriate recommendations, preventive care, screening tests, etc discussed and encouraged; healthy living encouraged; see AVS for patient education given to patient  

## 2016-07-03 NOTE — Progress Notes (Signed)
Patient ID: Stephanie Howe, female   DOB: 1961/03/17, 55 y.o.   MRN: [id number]   Subjective:   Stephanie Howe is a 55 y.o. female here for a complete physical exam  Interim issues since last visit:  Last labs were in April 2017; low Na+ and Cl- Drinks "right much" water, maybe 80+ ounces a day TSH normal at 4.218 LDL 98, HDL 82 Normal ALT 15  She is taking her thyroid medicine as directed; moving bowels okay; weight stable; some hair loss, not much; skin is very dry  USPSTF grade A and B recommendations Alcohol: no Depression: Depression screen [facility] 2/9 07/03/2016 05/31/2016 12/27/2015  Decreased Interest 0 0 0  Down, Depressed, Hopeless 0 0 0  PHQ - 2 Score 0 0 0     Hypertension: doing a lot of running today, working two jobs; had bologna for lunch yesterday Obesity: working on it, down 9 pounds from Feb Tobacco use: nonsmoker HIV, hep B, hep C: politely declined STD testing and prevention (chl/gon/syphilis): declined Lipids: today Glucose: today Colorectal cancer: negative Cologuard 12/18/15 Breast cancer: just UTD BRCA gene screening: mother had breast lumps, but not cancer, maybe cysts; no ovarian cancer Intimate partner violence: no abuse Cervical cancer screening: s/p hyst Lung cancer: >30 pack years smoking; discussed low dose chest CT Osteoporosis: hx of smoking; no fam hx; went through menopause surgically 28 years ago Fall prevention/vitamin D: gets outside Aspirin: taking 81 mg daily Diet: not a good eater; not ready to comitt Exercise: should be okay, active and cleaning Skin cancer: no moles have changed  Past Medical History:  Diagnosis Date  . Anemia   . COPD (chronic obstructive pulmonary disease) ([place])   . Hypertension   . Hypothyroidism   . Overweight   . Stone in kidney   . Vitamin D deficiency disease    Past Surgical History:  Procedure Laterality Date  . ABDOMINAL HYSTERECTOMY  1994  . BREAST EXCISIONAL BIOPSY Left 10+yrs ago   neg  . COLON  SURGERY  2002   colon exploratory surgery  . TONSILLECTOMY  1984   Family History  Problem Relation Age of Onset  . COPD Mother   . Hypertension Mother   . Pulmonary embolism Father   . Diabetes Father   . COPD Father   . Hypertension Brother   . Stroke Maternal Grandmother   . Cancer Neg Hx   . Heart disease Neg Hx   MD note: sister had to have her thyroid treated for it being overactive  Social History  Substance Use Topics  . Smoking status: Former Smoker    Packs/day: 1.50    Years: 30.00    Types: Cigarettes    Quit date: 09/24/2011  . Smokeless tobacco: Never Used  . Alcohol use No   Review of Systems  Constitutional: Negative for unexpected weight change.  HENT: Positive for hearing loss (but her children say she talks loudly, not sure if distracted; pt denies hearing eval).   Eyes: Positive for visual disturbance (vision getting worse; saw eye doctor at [facility]).  Respiratory: Negative for shortness of breath and wheezing.   Cardiovascular: Negative for chest pain.  Gastrointestinal: Negative for constipation and rectal pain.       Little bit of blood where she wipes, has a cut place, not mixed in the stool; not sure if wiping too hard; tried to look, maybe a tear or bump; 3-4 x a week; going on for a month or more  Endocrine: Negative for polydipsia and polyuria.  Genitourinary: Negative for hematuria and vaginal bleeding.  Musculoskeletal: Positive for arthralgias (stiffness in hands).  Neurological: Negative for tremors.  Hematological: Bruises/bleeds easily.  Psychiatric/Behavioral: Negative for dysphoric mood.    Objective:   Vitals:   07/03/16 1102  BP: (!) 154/80  Pulse: 85  Resp: 14  Temp: 98.6 F (37 C)  TempSrc: Oral  SpO2: 94%  Weight: 137 lb (62.1 kg)   Body mass index is 26.32 kg/m. Wt Readings from Last 3 Encounters:  07/03/16 137 lb (62.1 kg)  05/31/16 137 lb 9.6 oz (62.4 kg)  10/25/15 146 lb (66.2 kg)   Physical Exam   Constitutional: She appears well-developed and well-nourished.  HENT:  Head: Normocephalic and atraumatic.  Right Ear: Hearing, tympanic membrane, external ear and ear canal normal.  Left Ear: Hearing, tympanic membrane, external ear and ear canal normal.  Eyes: Conjunctivae and EOM are normal. Right eye exhibits no hordeolum. Left eye exhibits no hordeolum. No scleral icterus.  Neck: Carotid bruit is not present. No thyromegaly present.  Cardiovascular: Normal rate, regular rhythm, S1 normal, S2 normal and normal heart sounds.   No extrasystoles are present.  Pulmonary/Chest: Effort normal and breath sounds normal. No respiratory distress. Right breast exhibits no inverted nipple, no mass, no nipple discharge, no skin change and no tenderness. Left breast exhibits no inverted nipple, no mass, no nipple discharge, no skin change and no tenderness. Breasts are symmetrical.  Abdominal: Soft. Normal appearance and bowel sounds are normal. She exhibits no distension, no abdominal bruit, no pulsatile midline mass and no mass. There is no hepatosplenomegaly. There is no tenderness. No hernia.  Genitourinary: Rectal exam shows no fissure, no mass and no tenderness.  Musculoskeletal: Normal range of motion. She exhibits no edema.       Right hand: She exhibits deformity (enlarged 2nd MCP and PIPs, right worse than left).  Lymphadenopathy:       Head (right side): No submandibular adenopathy present.       Head (left side): No submandibular adenopathy present.    She has no cervical adenopathy.    She has no axillary adenopathy.  Neurological: She is alert. She displays no tremor. No cranial nerve deficit. She exhibits normal muscle tone. Gait normal.  Reflex Scores:      Patellar reflexes are 2+ on the right side and 2+ on the left side. Skin: Skin is warm and dry. No bruising and no ecchymosis noted. No cyanosis. No pallor.  Small scab left buttock, not vesicular, not a fissure; does not appear  herpetic; no rolled borders or keratosis to suggest skin cancer  Psychiatric: Her speech is normal and behavior is normal. Thought content normal. Her mood appears not anxious. She does not exhibit a depressed mood.   Assessment/Plan:   Problem List Items Addressed This Visit      Cardiovascular and Mediastinum   Hypertension    Not to goal; pt to try DASH guidelines, monitor BP and let me know if controlled        Endocrine   Hypothyroidism    Check TSH and adjust med if needed      Relevant Medications   levothyroxine (SYNTHROID, LEVOTHROID) 50 MCG tablet   Other Relevant Orders   TSH (Completed)   Early menopause    Ordering DEXA      Relevant Orders   DG Bone Density     Other   Vitamin D deficiency disease    Not  taking any supplement; having very dry skin; check level and supplement if needed      Relevant Orders   VITAMIN D 25 Hydroxy (Vit-D Deficiency, Fractures)   Preventative health care - Primary    USPSTF grade A and B recommendations reviewed with patient; age-appropriate recommendations, preventive care, screening tests, etc discussed and encouraged; healthy living encouraged; see AVS for patient education given to patient      Relevant Orders   COMPLETE METABOLIC PANEL WITH GFR (Completed)   Lipid panel (Completed)   CBC with Differential/Platelet (Completed)   Hx of tobacco use, presenting hazards to health   Relevant Orders   CT CHEST LUNG CA SCREEN LOW DOSE W/O CM   Heartburn    Avoid triggers; warned of dangers of PPI; switch to H2 blocker       Other Visit Diagnoses   None.    if she continues to see blood from sore near anus, let me know  Meds ordered this encounter  Medications  . ranitidine (ZANTAC) 300 MG capsule    Sig: Take 1 capsule (300 mg total) by mouth every evening. For heartburn    Dispense:  30 capsule    Refill:  11  . levothyroxine (SYNTHROID, LEVOTHROID) 50 MCG tablet    Sig: Take 1 1/2 tabs on Monday, Wednesday, and  Friday. Take one tablet on Sunday, Tuesday, Thursday, and Saturday.    Dispense:  37 tablet    Refill:  6   Orders Placed This Encounter  Procedures  . CT CHEST LUNG CA SCREEN LOW DOSE W/O CM    Order Specific Question:   Reason for Exam (SYMPTOM  OR DIAGNOSIS REQUIRED)    Answer:   >30 pack years, hx of smoking, quit 2013    Order Specific Question:   Is the patient pregnant?    Answer:   No    Order Specific Question:   Preferred Imaging Location?    Answer:   [facility]  . DG Bone Density    Order Specific Question:   Reason for Exam (SYMPTOM  OR DIAGNOSIS REQUIRED)    Answer:   early menopause, ovaries removed in her late 9s    Order Specific Question:   Preferred imaging location?    Answer:   [facility]    Order Specific Question:   Is the patient pregnant?    Answer:   No  . COMPLETE METABOLIC PANEL WITH GFR  . TSH  . Lipid panel  . CBC with Differential/Platelet  . VITAMIN D 25 Hydroxy (Vit-D Deficiency, Fractures)    Standing Status:   Future    Number of Occurrences:   1    Standing Expiration Date:   08/22/2016    Follow up plan: Return in about 1 year (around 07/03/2017) for complete physical; 2 months for blood pressure.  An After Visit Summary was printed and given to the patient.

## 2016-07-03 NOTE — Assessment & Plan Note (Signed)
Avoid triggers; warned of dangers of PPI; switch to H2 blocker

## 2016-07-03 NOTE — Patient Instructions (Addendum)
Stop pantoprazole and use 300 mg ranitidine instead Caution: prolonged use of proton pump inhibitors like omeprazole (Prilosec), pantoprazole (Protonix), esomeprazole (Nexium), and others like Dexilant and Aciphex may increase your risk of pneumonia, Clostridium difficile colitis, osteoporosis, anemia and other health complications Try to limit or avoid triggers like coffee, caffeinated beverages, onions, chocolate, spicy foods, peppermint, acid foods like pizza, spaghetti sauce, and orange juice Lose weight if you are overweight or obese Try elevating the head of your bed by placing a small wedge between your mattress and box springs to keep acid in the stomach at night instead of coming up into your esophagus Please do call to schedule your bone density study; the number to schedule one at either [facility] or [facility] is [phone] Your goal blood pressure is less than 140 mmHg on top. Try to follow the DASH guidelines (DASH stands for Dietary Approaches to Stop Hypertension) Try to limit the sodium in your diet.  Ideally, consume less than 1.5 grams (less than 1,566m) per day. Do not add salt when cooking or at the table.  Check the sodium amount on labels when shopping, and choose items lower in sodium when given a choice. Avoid or limit foods that already contain a lot of sodium. Eat a diet rich in fruits and vegetables and whole grains. Monitor your blood pressure at a local pharmacy once a week over the next 4 weeks and call me if the top number is 140 or higher so we can start treatment   Health Maintenance, Female Adopting a healthy lifestyle and getting preventive care can go a long way to promote health and wellness. Talk with your health care provider about what schedule of regular examinations is right for you. This is a good chance for you to check in with your provider about disease prevention and staying healthy. In between checkups, there are  plenty of things you can do on your own. Experts have done a lot of research about which lifestyle changes and preventive measures are most likely to keep you healthy. Ask your health care provider for more information. WEIGHT AND DIET  Eat a healthy diet  Be sure to include plenty of vegetables, fruits, low-fat dairy products, and lean protein.  Do not eat a lot of foods high in solid fats, added sugars, or salt.  Get regular exercise. This is one of the most important things you can do for your health.  Most adults should exercise for at least 150 minutes each week. The exercise should increase your heart rate and make you sweat (moderate-intensity exercise).  Most adults should also do strengthening exercises at least twice a week. This is in addition to the moderate-intensity exercise.  Maintain a healthy weight  Body mass index (BMI) is a measurement that can be used to identify possible weight problems. It estimates body fat based on height and weight. Your health care provider can help determine your BMI and help you achieve or maintain a healthy weight.  For females 219years of age and older:   A BMI below 18.5 is considered underweight.  A BMI of 18.5 to 24.9 is normal.  A BMI of 25 to 29.9 is considered overweight.  A BMI of 30 and above is considered obese.  Watch levels of cholesterol and blood lipids  You should start having your blood tested for lipids and cholesterol at 56years of age, then have this test every 5 years.  You may need to  have your cholesterol levels checked more often if:  Your lipid or cholesterol levels are high.  You are older than 56 years of age.  You are at high risk for heart disease.  CANCER SCREENING   Lung Cancer  Lung cancer screening is recommended for adults 55-70 years old who are at high risk for lung cancer because of a history of smoking.  A yearly low-dose CT scan of the lungs is recommended for people who:  Currently  smoke.  Have quit within the past 15 years.  Have at least a 30-pack-year history of smoking. A pack year is smoking an average of one pack of cigarettes a day for 1 year.  Yearly screening should continue until it has been 15 years since you quit.  Yearly screening should stop if you develop a health problem that would prevent you from having lung cancer treatment.  Breast Cancer  Practice breast self-awareness. This means understanding how your breasts normally appear and feel.  It also means doing regular breast self-exams. Let your health care provider know about any changes, no matter how small.  If you are in your 20s or 30s, you should have a clinical breast exam (CBE) by a health care provider every 1-3 years as part of a regular health exam.  If you are 32 or older, have a CBE every year. Also consider having a breast X-ray (mammogram) every year.  If you have a family history of breast cancer, talk to your health care provider about genetic screening.  If you are at high risk for breast cancer, talk to your health care provider about having an MRI and a mammogram every year.  Breast cancer gene (BRCA) assessment is recommended for women who have family members with BRCA-related cancers. BRCA-related cancers include:  Breast.  Ovarian.  Tubal.  Peritoneal cancers.  Results of the assessment will determine the need for genetic counseling and BRCA1 and BRCA2 testing. Cervical Cancer Your health care provider may recommend that you be screened regularly for cancer of the pelvic organs (ovaries, uterus, and vagina). This screening involves a pelvic examination, including checking for microscopic changes to the surface of your cervix (Pap test). You may be encouraged to have this screening done every 3 years, beginning at age 37.  For women ages 32-65, health care providers may recommend pelvic exams and Pap testing every 3 years, or they may recommend the Pap and pelvic  exam, combined with testing for human papilloma virus (HPV), every 5 years. Some types of HPV increase your risk of cervical cancer. Testing for HPV may also be done on women of any age with unclear Pap test results.  Other health care providers may not recommend any screening for nonpregnant women who are considered low risk for pelvic cancer and who do not have symptoms. Ask your health care provider if a screening pelvic exam is right for you.  If you have had past treatment for cervical cancer or a condition that could lead to cancer, you need Pap tests and screening for cancer for at least 20 years after your treatment. If Pap tests have been discontinued, your risk factors (such as having a new sexual partner) need to be reassessed to determine if screening should resume. Some women have medical problems that increase the chance of getting cervical cancer. In these cases, your health care provider may recommend more frequent screening and Pap tests. Colorectal Cancer  This type of cancer can be detected and often prevented.  Routine colorectal cancer screening usually begins at 56 years of age and continues through 56 years of age.  Your health care provider may recommend screening at an earlier age if you have risk factors for colon cancer.  Your health care provider may also recommend using home test kits to check for hidden blood in the stool.  A small camera at the end of a tube can be used to examine your colon directly (sigmoidoscopy or colonoscopy). This is done to check for the earliest forms of colorectal cancer.  Routine screening usually begins at age 36.  Direct examination of the colon should be repeated every 5-10 years through 56 years of age. However, you may need to be screened more often if early forms of precancerous polyps or small growths are found. Skin Cancer  Check your skin from head to toe regularly.  Tell your health care provider about any new moles or  changes in moles, especially if there is a change in a mole's shape or color.  Also tell your health care provider if you have a mole that is larger than the size of a pencil eraser.  Always use sunscreen. Apply sunscreen liberally and repeatedly throughout the day.  Protect yourself by wearing long sleeves, pants, a wide-brimmed hat, and sunglasses whenever you are outside. HEART DISEASE, DIABETES, AND HIGH BLOOD PRESSURE   High blood pressure causes heart disease and increases the risk of stroke. High blood pressure is more likely to develop in:  People who have blood pressure in the high end of the normal range (130-139/85-89 mm Hg).  People who are overweight or obese.  People who are African American.  If you are 79-28 years of age, have your blood pressure checked every 3-5 years. If you are 63 years of age or older, have your blood pressure checked every year. You should have your blood pressure measured twice--once when you are at a hospital or clinic, and once when you are not at a hospital or clinic. Record the average of the two measurements. To check your blood pressure when you are not at a hospital or clinic, you can use:  An automated blood pressure machine at a pharmacy.  A home blood pressure monitor.  If you are between 24 years and 80 years old, ask your health care provider if you should take aspirin to prevent strokes.  Have regular diabetes screenings. This involves taking a blood sample to check your fasting blood sugar level.  If you are at a normal weight and have a low risk for diabetes, have this test once every three years after 56 years of age.  If you are overweight and have a high risk for diabetes, consider being tested at a younger age or more often. PREVENTING INFECTION  Hepatitis B  If you have a higher risk for hepatitis B, you should be screened for this virus. You are considered at high risk for hepatitis B if:  You were born in a country where  hepatitis B is common. Ask your health care provider which countries are considered high risk.  Your parents were born in a high-risk country, and you have not been immunized against hepatitis B (hepatitis B vaccine).  You have HIV or AIDS.  You use needles to inject street drugs.  You live with someone who has hepatitis B.  You have had sex with someone who has hepatitis B.  You get hemodialysis treatment.  You take certain medicines for conditions, including cancer, organ  transplantation, and autoimmune conditions. Hepatitis C  Blood testing is recommended for:  Everyone born from [birth year] through 1966.  Anyone with known risk factors for hepatitis C. Sexually transmitted infections (STIs)  You should be screened for sexually transmitted infections (STIs) including gonorrhea and chlamydia if:  You are sexually active and are younger than 56 years of age.  You are older than 56 years of age and your health care provider tells you that you are at risk for this type of infection.  Your sexual activity has changed since you were last screened and you are at an increased risk for chlamydia or gonorrhea. Ask your health care provider if you are at risk.  If you do not have HIV, but are at risk, it may be recommended that you take a prescription medicine daily to prevent HIV infection. This is called pre-exposure prophylaxis (PrEP). You are considered at risk if:  You are sexually active and do not regularly use condoms or know the HIV status of your partner(s).  You take drugs by injection.  You are sexually active with a partner who has HIV. Talk with your health care provider about whether you are at high risk of being infected with HIV. If you choose to begin PrEP, you should first be tested for HIV. You should then be tested every 3 months for as long as you are taking PrEP.  PREGNANCY   If you are premenopausal and you may become pregnant, ask your health care provider about  preconception counseling.  If you may become pregnant, take 400 to 800 micrograms (mcg) of folic acid every day.  If you want to prevent pregnancy, talk to your health care provider about birth control (contraception). OSTEOPOROSIS AND MENOPAUSE   Osteoporosis is a disease in which the bones lose minerals and strength with aging. This can result in serious bone fractures. Your risk for osteoporosis can be identified using a bone density scan.  If you are 13 years of age or older, or if you are at risk for osteoporosis and fractures, ask your health care provider if you should be screened.  Ask your health care provider whether you should take a calcium or vitamin D supplement to lower your risk for osteoporosis.  Menopause may have certain physical symptoms and risks.  Hormone replacement therapy may reduce some of these symptoms and risks. Talk to your health care provider about whether hormone replacement therapy is right for you.  HOME CARE INSTRUCTIONS   Schedule regular health, dental, and eye exams.  Stay current with your immunizations.   Do not use any tobacco products including cigarettes, chewing tobacco, or electronic cigarettes.  If you are pregnant, do not drink alcohol.  If you are breastfeeding, limit how much and how often you drink alcohol.  Limit alcohol intake to no more than 1 drink per day for nonpregnant women. One drink equals 12 ounces of beer, 5 ounces of wine, or 1 ounces of hard liquor.  Do not use street drugs.  Do not share needles.  Ask your health care provider for help if you need support or information about quitting drugs.  Tell your health care provider if you often feel depressed.  Tell your health care provider if you have ever been abused or do not feel safe at home.   This information is not intended to replace advice given to you by your health care provider. Make sure you discuss any questions you have with your health care  provider.  Document Released: 03/25/2011 Document Revised: 09/30/2014 Document Reviewed: 08/11/2013 Elsevier Interactive Patient Education 2016 [place] DASH stands for "Dietary Approaches to Stop Hypertension." The DASH eating plan is a healthy eating plan that has been shown to reduce high blood pressure (hypertension). Additional health benefits may include reducing the risk of type 2 diabetes mellitus, heart disease, and stroke. The DASH eating plan may also help with weight loss. WHAT DO I NEED TO KNOW ABOUT THE DASH EATING PLAN? For the DASH eating plan, you will follow these general guidelines:  Choose foods with a percent daily value for sodium of less than 5% (as listed on the food label).  Use salt-free seasonings or herbs instead of table salt or sea salt.  Check with your health care provider or pharmacist before using salt substitutes.  Eat lower-sodium products, often labeled as "lower sodium" or "no salt added."  Eat fresh foods.  Eat more vegetables, fruits, and low-fat dairy products.  Choose whole grains. Look for the word "whole" as the first word in the ingredient list.  Choose fish and skinless chicken or [place] more often than red meat. Limit fish, poultry, and meat to 6 oz (170 g) each day.  Limit sweets, desserts, sugars, and sugary drinks.  Choose heart-healthy fats.  Limit cheese to 1 oz (28 g) per day.  Eat more home-cooked food and less restaurant, buffet, and fast food.  Limit fried foods.  Cook foods using methods other than frying.  Limit canned vegetables. If you do use them, rinse them well to decrease the sodium.  When eating at a restaurant, ask that your food be prepared with less salt, or no salt if possible. WHAT FOODS CAN I EAT? Seek help from a dietitian for individual calorie needs. Grains Whole grain or whole wheat bread. Brown rice. Whole grain or whole wheat pasta. Quinoa, bulgur, and whole grain cereals.  Low-sodium cereals. Corn or whole wheat flour tortillas. Whole grain cornbread. Whole grain crackers. Low-sodium crackers. Vegetables Fresh or frozen vegetables (raw, steamed, roasted, or grilled). Low-sodium or reduced-sodium tomato and vegetable juices. Low-sodium or reduced-sodium tomato sauce and paste. Low-sodium or reduced-sodium canned vegetables.  Fruits All fresh, canned (in natural juice), or frozen fruits. Meat and Other Protein Products Ground beef (85% or leaner), grass-fed beef, or beef trimmed of fat. Skinless chicken or [place]. Ground chicken or [place]. Pork trimmed of fat. All fish and seafood. Eggs. Dried beans, peas, or lentils. Unsalted nuts and seeds. Unsalted canned beans. Dairy Low-fat dairy products, such as skim or 1% milk, 2% or reduced-fat cheeses, low-fat ricotta or cottage cheese, or plain low-fat yogurt. Low-sodium or reduced-sodium cheeses. Fats and Oils Tub margarines without trans fats. Light or reduced-fat mayonnaise and salad dressings (reduced sodium). Avocado. Safflower, olive, or canola oils. Natural peanut or almond butter. Other Unsalted popcorn and pretzels. The items listed above may not be a complete list of recommended foods or beverages. Contact your dietitian for more options. WHAT FOODS ARE NOT RECOMMENDED? Grains White bread. White pasta. White rice. Refined cornbread. Bagels and croissants. Crackers that contain trans fat. Vegetables Creamed or fried vegetables. Vegetables in a cheese sauce. Regular canned vegetables. Regular canned tomato sauce and paste. Regular tomato and vegetable juices. Fruits Dried fruits. Canned fruit in light or heavy syrup. Fruit juice. Meat and Other Protein Products Fatty cuts of meat. Ribs, chicken wings, bacon, sausage, bologna, salami, chitterlings, fatback, hot dogs, bratwurst, and packaged luncheon meats. Salted nuts and seeds. Canned beans with  salt. Dairy Whole or 2% milk, cream, half-and-half, and cream  cheese. Whole-fat or sweetened yogurt. Full-fat cheeses or blue cheese. Nondairy creamers and whipped toppings. Processed cheese, cheese spreads, or cheese curds. Condiments Onion and garlic salt, seasoned salt, table salt, and sea salt. Canned and packaged gravies. Worcestershire sauce. Tartar sauce. Barbecue sauce. Teriyaki sauce. Soy sauce, including reduced sodium. Steak sauce. Fish sauce. Oyster sauce. Cocktail sauce. Horseradish. Ketchup and mustard. Meat flavorings and tenderizers. Bouillon cubes. Hot sauce. Tabasco sauce. Marinades. Taco seasonings. Relishes. Fats and Oils Butter, stick margarine, lard, shortening, ghee, and bacon fat. Coconut, palm kernel, or palm oils. Regular salad dressings. Other Pickles and olives. Salted popcorn and pretzels. The items listed above may not be a complete list of foods and beverages to avoid. Contact your dietitian for more information. WHERE CAN I FIND MORE INFORMATION? National Heart, Lung, and Blood Institute: travelstabloid.com   This information is not intended to replace advice given to you by your health care provider. Make sure you discuss any questions you have with your health care provider.   Document Released: 08/29/2011 Document Revised: 09/30/2014 Document Reviewed: 07/14/2013 Elsevier Interactive Patient Education [employer].

## 2016-07-03 NOTE — Assessment & Plan Note (Signed)
Ordering DEXA

## 2016-07-03 NOTE — Assessment & Plan Note (Signed)
Not taking any supplement; having very dry skin; check level and supplement if needed

## 2016-07-03 NOTE — Assessment & Plan Note (Signed)
Check TSH and adjust med if needed 

## 2016-07-05 ENCOUNTER — Other Ambulatory Visit: Payer: Self-pay

## 2016-07-05 DIAGNOSIS — E559 Vitamin D deficiency, unspecified: Secondary | ICD-10-CM

## 2016-07-05 LAB — VITAMIN D 25 HYDROXY (VIT D DEFICIENCY, FRACTURES): Vit D, 25-Hydroxy: 32 ng/mL (ref 30–100)

## 2016-07-06 NOTE — Assessment & Plan Note (Signed)
Not to goal; pt to try DASH guidelines, monitor BP and let me know if controlled

## 2016-07-17 ENCOUNTER — Telehealth: Payer: Self-pay | Admitting: *Deleted

## 2016-07-17 NOTE — Telephone Encounter (Signed)
Received referral for initial lung cancer screening scan. Discussed screening availability and patient cannot make a Tuesday shared decision making visit. Will notify patient when it is available on another day of the week. Patient is aware this is likely to be after the 1st of the year.

## 2016-09-27 ENCOUNTER — Other Ambulatory Visit: Payer: Self-pay | Admitting: Family Medicine

## 2016-09-27 ENCOUNTER — Telehealth: Payer: Self-pay

## 2016-09-27 DIAGNOSIS — E038 Other specified hypothyroidism: Secondary | ICD-10-CM

## 2016-09-27 NOTE — Telephone Encounter (Signed)
[facility] pharmacy sent a fax asking to to change generic Manufacture . Narrow therapeutic drug

## 2016-09-27 NOTE — Telephone Encounter (Signed)
Refill approved.

## 2016-09-27 NOTE — Telephone Encounter (Signed)
No drug name given, but I'm going to assume it's the thyroid drug I called pharmacy to verify Okay to switch levothyroxine; recheck TSH 6 weeks after switch; okay given to Stephanie Howe Ordered TSH Called pt, left detailed message; mark calendar for Feb 19th or six weeks after getting new medicine to recheck thyroid lab; call with any problems before then

## 2016-09-27 NOTE — Assessment & Plan Note (Signed)
Okay to change manufacturer Recheck TSH in 6 weeks

## 2016-10-02 ENCOUNTER — Telehealth: Payer: Self-pay | Admitting: *Deleted

## 2016-10-02 DIAGNOSIS — Z87891 Personal history of nicotine dependence: Secondary | ICD-10-CM

## 2016-10-02 NOTE — Telephone Encounter (Signed)
Received referral for initial lung cancer screening scan. Contacted patient and obtained smoking history,(former, quit 09/24/11, 45 pack year) as well as answering questions related to screening process. Patient denies signs of lung cancer such as weight loss or hemoptysis. Patient denies comorbidity that would prevent curative treatment if lung cancer were found. Patient is tentatively scheduled for shared decision making visit and CT scan on 10/09/16, pending insurance approval from business office.

## 2016-10-09 ENCOUNTER — Ambulatory Visit: Payer: BLUE CROSS/BLUE SHIELD | Admitting: Oncology

## 2016-10-09 ENCOUNTER — Ambulatory Visit: Admission: RE | Admit: 2016-10-09 | Payer: BLUE CROSS/BLUE SHIELD | Source: Ambulatory Visit

## 2016-10-09 ENCOUNTER — Inpatient Hospital Stay: Payer: BLUE CROSS/BLUE SHIELD | Admitting: Oncology

## 2016-10-15 ENCOUNTER — Ambulatory Visit: Payer: BLUE CROSS/BLUE SHIELD | Admitting: Oncology

## 2016-10-16 ENCOUNTER — Inpatient Hospital Stay: Payer: BLUE CROSS/BLUE SHIELD | Attending: Oncology | Admitting: Oncology

## 2016-10-16 ENCOUNTER — Ambulatory Visit
Admission: RE | Admit: 2016-10-16 | Discharge: 2016-10-16 | Disposition: A | Discharge status: Home / Self Care | Payer: BLUE CROSS/BLUE SHIELD | Source: Ambulatory Visit | Attending: Oncology | Admitting: Oncology

## 2016-10-16 DIAGNOSIS — J439 Emphysema, unspecified: Secondary | ICD-10-CM | POA: Insufficient documentation

## 2016-10-16 DIAGNOSIS — Z87891 Personal history of nicotine dependence: Secondary | ICD-10-CM

## 2016-10-16 DIAGNOSIS — Z122 Encounter for screening for malignant neoplasm of respiratory organs: Secondary | ICD-10-CM

## 2016-10-16 DIAGNOSIS — I700 Atherosclerosis of aorta: Secondary | ICD-10-CM | POA: Insufficient documentation

## 2016-10-16 NOTE — Progress Notes (Signed)
In accordance with CMS guidelines, patient has met eligibility criteria including age, absence of signs or symptoms of lung cancer.  Social History  Substance Use Topics  . Smoking status: Former Smoker    Packs/day: 1.50    Years: 30.00    Types: Cigarettes    Quit date: 09/24/2011  . Smokeless tobacco: Never Used  . Alcohol use No     A shared decision-making session was conducted prior to the performance of CT scan. This includes one or more decision aids, includes benefits and harms of screening, follow-up diagnostic testing, over-diagnosis, false positive rate, and total radiation exposure.  Counseling on the importance of adherence to annual lung cancer LDCT screening, impact of co-morbidities, and ability or willingness to undergo diagnosis and treatment is imperative for compliance of the program.  Counseling on the importance of continued smoking cessation for former smokers; the importance of smoking cessation for current smokers, and information about tobacco cessation interventions have been given to patient including [place] and [facility] quit [place] programs.  Written order for lung cancer screening with LDCT has been given to the patient and any and all questions have been answered to the best of my abilities.   Yearly follow up will be coordinated by Burgess Estelle, Thoracic Navigator.  Faythe Casa, NP

## 2016-10-17 ENCOUNTER — Encounter: Payer: Self-pay | Admitting: *Deleted

## 2016-10-17 DIAGNOSIS — Z87891 Personal history of nicotine dependence: Secondary | ICD-10-CM | POA: Insufficient documentation

## 2016-10-22 ENCOUNTER — Ambulatory Visit: Payer: BLUE CROSS/BLUE SHIELD | Admitting: Oncology

## 2016-11-11 ENCOUNTER — Ambulatory Visit: Payer: BLUE CROSS/BLUE SHIELD | Admitting: Family Medicine

## 2016-11-15 ENCOUNTER — Other Ambulatory Visit
Admission: RE | Admit: 2016-11-15 | Discharge: 2016-11-15 | Disposition: A | Discharge status: Home / Self Care | Payer: BLUE CROSS/BLUE SHIELD | Source: Ambulatory Visit | Attending: Family Medicine | Admitting: Family Medicine

## 2016-11-15 ENCOUNTER — Ambulatory Visit (INDEPENDENT_AMBULATORY_CARE_PROVIDER_SITE_OTHER): Payer: BLUE CROSS/BLUE SHIELD | Admitting: Family Medicine

## 2016-11-15 ENCOUNTER — Encounter: Payer: Self-pay | Admitting: Family Medicine

## 2016-11-15 VITALS — BP 136/82 | HR 75 | Temp 98.1°F | Resp 16 | Wt 139.2 lb

## 2016-11-15 DIAGNOSIS — L309 Dermatitis, unspecified: Secondary | ICD-10-CM | POA: Diagnosis not present

## 2016-11-15 DIAGNOSIS — Z5181 Encounter for therapeutic drug level monitoring: Secondary | ICD-10-CM | POA: Diagnosis not present

## 2016-11-15 DIAGNOSIS — Z862 Personal history of diseases of the blood and blood-forming organs and certain disorders involving the immune mechanism: Secondary | ICD-10-CM | POA: Diagnosis not present

## 2016-11-15 DIAGNOSIS — I2510 Atherosclerotic heart disease of native coronary artery without angina pectoris: Secondary | ICD-10-CM

## 2016-11-15 DIAGNOSIS — E038 Other specified hypothyroidism: Secondary | ICD-10-CM | POA: Diagnosis present

## 2016-11-15 DIAGNOSIS — E782 Mixed hyperlipidemia: Secondary | ICD-10-CM

## 2016-11-15 DIAGNOSIS — R0789 Other chest pain: Secondary | ICD-10-CM

## 2016-11-15 DIAGNOSIS — I25119 Atherosclerotic heart disease of native coronary artery with unspecified angina pectoris: Secondary | ICD-10-CM | POA: Diagnosis not present

## 2016-11-15 HISTORY — DX: Personal history of diseases of the blood and blood-forming organs and certain disorders involving the immune mechanism: Z86.2

## 2016-11-15 LAB — CBC WITH DIFFERENTIAL/PLATELET
BASOS ABS: 0.1 10*3/uL (ref 0–0.1)
Basophils Relative: 1 %
Eosinophils Absolute: 0.3 10*3/uL (ref 0–0.7)
Eosinophils Relative: 3 %
HEMATOCRIT: 39.4 % (ref 35.0–47.0)
HEMOGLOBIN: 13.4 g/dL (ref 12.0–16.0)
LYMPHS PCT: 19 %
Lymphs Abs: 1.4 10*3/uL (ref 1.0–3.6)
MCH: 27.5 pg (ref 26.0–34.0)
MCHC: 34.1 g/dL (ref 32.0–36.0)
MCV: 80.8 fL (ref 80.0–100.0)
MONO ABS: 0.5 10*3/uL (ref 0.2–0.9)
MONOS PCT: 7 %
NEUTROS ABS: 5.3 10*3/uL (ref 1.4–6.5)
NEUTROS PCT: 70 %
Platelets: 342 10*3/uL (ref 150–440)
RBC: 4.88 MIL/uL (ref 3.80–5.20)
RDW: 15 % — ABNORMAL HIGH (ref 11.5–14.5)
WBC: 7.6 10*3/uL (ref 3.6–11.0)

## 2016-11-15 LAB — LIPID PANEL
CHOLESTEROL: 210 mg/dL — AB (ref 0–200)
HDL: 88 mg/dL (ref >40)
LDL Cholesterol: 106 mg/dL — ABNORMAL HIGH (ref 0–99)
TRIGLYCERIDES: 80 mg/dL (ref <150)
Total CHOL/HDL Ratio: 2.4 RATIO
VLDL: 16 mg/dL (ref 0–40)

## 2016-11-15 LAB — COMPREHENSIVE METABOLIC PANEL
ALK PHOS: 79 U/L (ref 38–126)
ALT: 15 U/L (ref 14–54)
AST: 22 U/L (ref 15–41)
Albumin: 4.4 g/dL (ref 3.5–5.0)
Anion gap: 7 (ref 5–15)
BILIRUBIN TOTAL: 0.3 mg/dL (ref 0.3–1.2)
BUN: 12 mg/dL (ref 6–20)
CALCIUM: 9.3 mg/dL (ref 8.9–10.3)
CO2: 27 mmol/L (ref 22–32)
Chloride: 104 mmol/L (ref 101–111)
Creatinine, Ser: 0.72 mg/dL (ref 0.44–1.00)
Glucose, Bld: 89 mg/dL (ref 65–99)
Potassium: 4.6 mmol/L (ref 3.5–5.1)
Sodium: 138 mmol/L (ref 135–145)
Total Protein: 7.6 g/dL (ref 6.5–8.1)

## 2016-11-15 LAB — TROPONIN I: Troponin I: <0.03 ng/mL (ref <0.03)

## 2016-11-15 LAB — FERRITIN: Ferritin: 41 ng/mL (ref 11–307)

## 2016-11-15 LAB — TSH: TSH: 4.451 u[IU]/mL (ref 0.350–4.500)

## 2016-11-15 MED ORDER — TRIAMCINOLONE ACETONIDE 0.1 % EX CREA: 1.0000 "application " | TOPICAL_CREAM | Freq: Two times a day (BID) | CUTANEOUS | 2 refills | Status: DC

## 2016-11-15 NOTE — Assessment & Plan Note (Signed)
Reviewed last lipids

## 2016-11-15 NOTE — Assessment & Plan Note (Signed)
-  Check CBC and ferritin today.  

## 2016-11-15 NOTE — Assessment & Plan Note (Signed)
Now back on old formulation; check TSH and free T4

## 2016-11-15 NOTE — Assessment & Plan Note (Signed)
Rx cream

## 2016-11-15 NOTE — Assessment & Plan Note (Signed)
Check labs 

## 2016-11-15 NOTE — Patient Instructions (Signed)
If you develop any signs or symptoms of a heart attack, call 911 Get labs today I will call you at [phone] about your troponin, so if you haven't heard back about this by mid-afternoon, call us here at the office Try to limit saturated fats in your diet (bologna, hot dogs, barbeque, cheeseburgers, hamburgers, steak, bacon, sausage, cheese, etc.) and get more fresh fruits, vegetables, and whole grains

## 2016-11-15 NOTE — Assessment & Plan Note (Signed)
Noted on CT scan; refer to cardiologist for testing; new LDL goal is less than 70

## 2016-11-15 NOTE — Progress Notes (Signed)
BP 136/82 (BP Location: Right Arm, Cuff Size: Normal)   Pulse 75   Temp 98.1 F (36.7 C) (Oral)   Resp 16   Wt 139 lb 4 oz (63.2 kg)   SpO2 96%   BMI 25.88 kg/m    Subjective:    Patient ID: Stephanie Howe, female    DOB: 1961/07/17, 56 y.o.   MRN: [id number]  HPI: Stephanie Howe is a 56 y.o. female  Chief Complaint  Patient presents with  . Hypothyroidism  . Anemia    Would like to get thid check   . Chest Pain    mild chest pain last tuesday; her left was aching but it suddenly stop. Pt also experiencing dzziness and fatigure every morning  for the past two weeks   . Blood Sugar Problem    Numbers were high monday 177 but it went down. Patient has her readings with wrote down    Main reason for visit today is rechecking thyroid; due for recheck today; they changed up her thyroid medicine and now due for recheck, but they changed it back to the old formulation  Corners of her mouth are starting to split; when that happened, she was told she was anemia; wonders if that's maing her dizzy Hx of iron def, no active bleeding; last vit D 32  Had left arm aching on Tuesday; lasted about 10 minutes; across the front, upper part of the chest; no nausea with that; got a little dizzy; no SHOB; no sure about fam hx of heart disease  Weight reviewed; loves butterfingers  She eats two hots dogs at night; sandwiches for lunch ("you don't want to hear that, fried bologna"); declined nutritionist  [facility] 11/15/2016 07/03/2016 05/31/2016 12/27/2015  Decreased Interest 0 0 0 0  Down, Depressed, Hopeless 0 0 0 0  PHQ - 2 Score 0 0 0 0    No flowsheet data found.  Relevant past medical, surgical, family and social history reviewed Past Medical History:  Diagnosis Date  . Anemia   . COPD (chronic obstructive pulmonary disease) (HCC)   . Hypertension   . Hypothyroidism   . Overweight   . Vitamin D deficiency disease    Past Surgical History:  Procedure Laterality  Date  . ABDOMINAL HYSTERECTOMY  1994  . BREAST EXCISIONAL BIOPSY Left 10+yrs ago   neg  . COLON SURGERY  2002   colon exploratory surgery  . TONSILLECTOMY  1984   Family History  Problem Relation Age of Onset  . COPD Mother   . Hypertension Mother   . Pulmonary embolism Father   . Diabetes Father   . COPD Father   . Hypertension Brother   . Stroke Maternal Grandmother   . Cancer Neg Hx   . Heart disease Neg Hx    Social History  Substance Use Topics  . Smoking status: Former Smoker    Packs/day: 1.50    Years: 30.00    Types: Cigarettes    Quit date: 09/24/2011  . Smokeless tobacco: Never Used  . Alcohol use No    Interim medical history since last visit reviewed. Allergies and medications reviewed  Review of Systems Per HPI unless specifically indicated above     Objective:    BP 136/82 (BP Location: Right Arm, Cuff Size: Normal)   Pulse 75   Temp 98.1 F (36.7 C) (Oral)   Resp 16   Wt 139 lb 4 oz (63.2 kg)   SpO2 96%  BMI 25.88 kg/m   Wt Readings from Last 3 Encounters:  11/20/16 144 lb 8 oz (65.5 kg)  11/15/16 139 lb 4 oz (63.2 kg)  10/16/16 130 lb (59 kg)   MD note: patient was not weighed on Oct 16, 2016   Today's Vitals   11/15/16 1034 11/15/16 1040  BP: 124/78 136/82  Pulse: 73 75  Resp: 16   Temp: 98.1 F (36.7 C)   TempSrc: Oral   SpO2: 96%   Weight: 139 lb 4 oz (63.2 kg)    Physical Exam  Constitutional: She appears well-developed and well-nourished. No distress.  HENT:  Head: Normocephalic and atraumatic.  Eyes: EOM are normal. No scleral icterus.  Neck: No thyromegaly present.  Cardiovascular: Normal rate, regular rhythm and normal heart sounds.   No murmur heard. Pulmonary/Chest: Effort normal and breath sounds normal. No respiratory distress. She has no wheezes.  Abdominal: Soft. Bowel sounds are normal. She exhibits no distension.  Musculoskeletal: Normal range of motion. She exhibits no edema.  Neurological: She is alert.  She exhibits normal muscle tone.  Skin: Skin is warm and dry. She is not diaphoretic. No pallor.  Psychiatric: She has a normal mood and affect. Her behavior is normal. Judgment and thought content normal.      Assessment & Plan:   Problem List Items Addressed This Visit      Cardiovascular and Mediastinum   Coronary artery disease    Noted on CT scan; refer to cardiologist for testing; new LDL goal is less than 70      Relevant Orders   Ambulatory referral to Cardiology   Troponin I     Endocrine   Hypothyroidism    Now back on old formulation; check TSH and free T4      Relevant Orders   TSH     Musculoskeletal and Integument   Chronic dermatitis of hands    Rx cream        Other   Medication monitoring encounter    Check labs      Relevant Orders   COMPLETE METABOLIC PANEL WITH GFR   Hyperlipidemia    Reviewed last lipids      Relevant Orders   Lipid panel   Hx of iron deficiency anemia    Check CBC and ferritin today      Relevant Orders   CBC with Differential/Platelet   Ferritin    Other Visit Diagnoses    Other chest pain    -  Primary   discussed heart disease, symptoms; reasons to call 911; aspirin, refer to cardiologist; to hospital if sx recur before work-up   Relevant Orders   Ambulatory referral to Cardiology   Troponin I      Follow up plan: Return in about 2 weeks (around 11/29/2016) for follow-up.  An after-visit summary was printed and given to the patient at check-out.  Please see the patient instructions which may contain other information and recommendations beyond what is mentioned above in the assessment and plan.  Meds ordered this encounter  Medications  . triamcinolone cream (KENALOG) 0.1 %    Sig: Apply 1 application topically 2 (two) times daily. Too strong for face, under arms, groin; never use on children    Dispense:  30 g    Refill:  2    Orders Placed This Encounter  Procedures  . Lipid panel  . CBC with  Differential/Platelet  . Ferritin  . TSH  . Troponin I  . COMPLETE  METABOLIC PANEL WITH GFR  . Ambulatory referral to Cardiology

## 2016-11-18 ENCOUNTER — Encounter: Payer: Self-pay | Admitting: Family Medicine

## 2016-11-20 ENCOUNTER — Ambulatory Visit (INDEPENDENT_AMBULATORY_CARE_PROVIDER_SITE_OTHER): Payer: BLUE CROSS/BLUE SHIELD | Admitting: Internal Medicine

## 2016-11-20 ENCOUNTER — Encounter: Payer: Self-pay | Admitting: Internal Medicine

## 2016-11-20 VITALS — BP 144/76 | HR 64 | Ht 61.0 in | Wt 144.5 lb

## 2016-11-20 DIAGNOSIS — I10 Essential (primary) hypertension: Secondary | ICD-10-CM

## 2016-11-20 DIAGNOSIS — R079 Chest pain, unspecified: Secondary | ICD-10-CM

## 2016-11-20 MED ORDER — AMLODIPINE BESYLATE 10 MG PO TABS: 10.0000 mg | ORAL_TABLET | Freq: Every day | ORAL | 3 refills | Status: DC

## 2016-11-20 MED ORDER — NITROGLYCERIN 0.4 MG SL SUBL: 0.4000 mg | SUBLINGUAL_TABLET | SUBLINGUAL | 3 refills | Status: DC | PRN

## 2016-11-20 NOTE — Progress Notes (Signed)
New Outpatient Visit Date: 11/20/2016  Referring Provider: Kerman Passey, MD [address] [place], [place] [address]  Chief Complaint: Chest pain  HPI:  Stephanie Howe is a 56 y.o. year-old female with history of hypertension, hypothyroidism, COPD, and anemia, who has been referred by Dr. Sherie Don for evaluation of chest pain. She reports a single episode of chest pressure and accompanying "ache" in the left arm approximately 2 weeks ago. At the time, she was at work [employer]), standing still when the symptoms began. The chest discomfort lasted about 10 minutes with an intensity of 4-5/10. It was substernal and associated with dizziness and nausea. She has not had a recurrence. However, Stephanie Howe notes exertional dyspnea, wheezing, and chest tightness when walking fasting or climbing stairs over the last year. This has been stable and resolves promptly with rest. Stephanie Howe underwent left heart catheterization for chest pain and equivocal exercise tolerance test in 2003; no significant CAD was found at that time.  Stephanie Howe denies palpitations, edema, orthopnea, PND, and claudication. She notes that her blood pressure has been running a bi thigh at home (systolic BP in the 140's.  --------------------------------------------------------------------------------------------------  Cardiovascular History & Procedures: Cardiovascular Problems:  Chest pain  Risk Factors:  Hypertension and history of tobacco use  Cath/PCI:  LHC (05/07/02): Angiographically normal coronary arteries. LVEF 60%. LVEDP 19 mmHg.  CV Surgery:  None  EP Procedures and Devices:  None  Non-Invasive Evaluation(s):  Exercise tolerance test (2003): Reportedly equivocal  Recent CV Pertinent Labs: Lab Results  Component Value Date   CHOL 210 (H) 11/15/2016   HDL 88 11/15/2016   LDLCALC 106 (H) 11/15/2016   TRIG 80 11/15/2016   CHOLHDL 2.4 11/15/2016   INR 0.9 07/17/2007   K 4.6 11/15/2016   K 3.8  11/30/2013   BUN 12 11/15/2016   BUN 1 (A) 10/18/2015   BUN 8 11/30/2013   CREATININE 0.72 11/15/2016   CREATININE 0.81 07/03/2016    --------------------------------------------------------------------------------------------------  Past Medical History:  Diagnosis Date  . Anemia   . COPD (chronic obstructive pulmonary disease) (HCC)   . Coronary artery disease 11/15/2016  . Hypertension   . Hypothyroidism   . Overweight   . Vitamin D deficiency disease     Past Surgical History:  Procedure Laterality Date  . ABDOMINAL HYSTERECTOMY  1994  . BREAST EXCISIONAL BIOPSY Left 10+yrs ago   neg  . COLON SURGERY  2002   colon exploratory surgery  . TONSILLECTOMY  1984    Outpatient Encounter Prescriptions as of 11/20/2016  Medication Sig  . albuterol (PROVENTIL) (2.5 MG/3ML) 0.083% nebulizer solution Take 3 mLs (2.5 mg total) by nebulization every 4 (four) hours as needed for wheezing or shortness of breath.  Marland Kitchen amLODipine (NORVASC) 5 MG tablet TAKE ONE TABLET BY MOUTH ONCE DAILY FOR BLOOD PRESSURE  . aspirin EC 81 MG tablet Take 81 mg by mouth daily.  Marland Kitchen levothyroxine (SYNTHROID, LEVOTHROID) 50 MCG tablet Take 1 1/2 tabs on Monday, Wednesday, and Friday. Take one tablet on Sunday, Tuesday, Thursday, and Saturday.  . ranitidine (ZANTAC) 300 MG capsule Take 1 capsule (300 mg total) by mouth every evening. For heartburn  . triamcinolone cream (KENALOG) 0.1 % Apply 1 application topically 2 (two) times daily. Too strong for face, under arms, groin; never use on children   No facility-administered encounter medications on file as of 11/20/2016.     Allergies: Codeine and Penicillins  Social History   Social History  . Marital  status: Single    Spouse name: N/A  . Number of children: N/A  . Years of education: N/A   Occupational History  . Not on file.   Social History Main Topics  . Smoking status: Former Smoker    Packs/day: 1.50    Years: 30.00    Types: Cigarettes     Quit date: 09/24/2011  . Smokeless tobacco: Never Used  . Alcohol use No  . Drug use: No  . Sexual activity: Not on file   Other Topics Concern  . Not on file   Social History Narrative  . No narrative on file    Family History  Problem Relation Age of Onset  . COPD Mother   . Hypertension Mother   . Pulmonary embolism Father   . Diabetes Father   . COPD Father   . Hypertension Brother   . Stroke Maternal Grandmother   . Cancer Neg Hx   . Heart disease Neg Hx     Review of Systems: A 12-system review of systems was performed and was negative except as noted in the HPI.  --------------------------------------------------------------------------------------------------  Physical Exam: BP (!) 144/76 (BP Location: Right Arm, Patient Position: Sitting, Cuff Size: Normal)   Pulse 64   Ht 5\' 1"  (1.549 m)   Wt 144 lb 8 oz (65.5 kg)   BMI 27.30 kg/m   General:  Well-developed, well-nourished woman, seated comfortably in the exam room. HEENT: No conjunctival pallor or scleral icterus.  Moist mucous membranes.  OP clear. Neck: Supple without lymphadenopathy, thyromegaly, JVD, or HJR.  No carotid bruit. Lungs: Normal work of breathing.  Clear to auscultation bilaterally without wheezes or crackles. Heart: Regular rate and rhythm without murmurs, rubs, or gallops.  Non-displaced PMI. Abd: Bowel sounds present.  Soft, NT/ND without hepatosplenomegaly Ext: No lower extremity edema.  Radial, PT, and DP pulses are 2+ bilaterally Skin: warm and dry without rash Neuro: CNIII-XII intact.  Strength and fine-touch sensation intact in upper and lower extremities bilaterally. Psych: Normal mood and affect.  EKG:  Normal sinus rhythm without significant abnormalities. No significant change from prior tracing on 11/15/16 (I have personally reviewed both tracings).  Lab Results  Component Value Date   WBC 7.6 11/15/2016   HGB 13.4 11/15/2016   HCT 39.4 11/15/2016   MCV 80.8 11/15/2016    PLT 342 11/15/2016    Lab Results  Component Value Date   NA 138 11/15/2016   K 4.6 11/15/2016   CL 104 11/15/2016   CO2 27 11/15/2016   BUN 12 11/15/2016   CREATININE 0.72 11/15/2016   GLUCOSE 89 11/15/2016   ALT 15 11/15/2016    Lab Results  Component Value Date   CHOL 210 (H) 11/15/2016   HDL 88 11/15/2016   LDLCALC 106 (H) 11/15/2016   TRIG 80 11/15/2016   CHOLHDL 2.4 11/15/2016    --------------------------------------------------------------------------------------------------  ASSESSMENT AND PLAN: Chest pain Patient reports two distinct chest pains: chronic exertional chest tightness accompanied by dyspnea consistent with stable angina and acute chest pain with nausea, dizziness, and left arm pain while standing at work earlier in the month. Her exam and EKG are unremarkable. Cardiac catheterization in 2003 was normal (the patient does not remember what symptoms were at that time). Her cardiac risk factors include hypertension and history of tobacco use. We have discussed options for further evaluation and have agreed to perform a myocardial perfusion stress test. I recommend increasing amlodipine to 10 mg daily for improved blood pressure control and  anti-anginal effect. I have also provided the patient with a prescription for sublingual NTG to be taken as needed for chest pain. It is reasonable to continue low-dose aspirin. I will defer adding a statin pending the results of the stress test.  Hypertension Blood pressure is mildly elevated today. I will increase amlodipine to 10 mg daily.  Follow-up: Return to clinic in 1 month.  Yvonne Kendall, MD 11/20/2016 9:43 AM

## 2016-11-20 NOTE — Patient Instructions (Addendum)
Medication Instructions:  Your physician has recommended you make the following change in your medication:  1- INCREASE Amlodipine to 10 mg by mouth once a day. 2- Nitroglycerin 0.4mg  tablets, Take 1 tablet by mouth every 5 minutes as needed for chest pain, for MAXIMUM of 3 doses.   Labwork: none  Testing/Procedures: Your physician has requested that you have en exercise stress myoview. For further information please visit [email or web address] Please follow instruction sheet, as given.  [facility] MYOVIEW  Your caregiver has ordered a Stress Test with nuclear imaging. The purpose of this test is to evaluate the blood supply to your heart muscle. This procedure is referred to as a "Non-Invasive Stress Test." This is because other than having an IV started in your vein, nothing is inserted or "invades" your body. Cardiac stress tests are done to find areas of poor blood flow to the heart by determining the extent of coronary artery disease (CAD). Some patients exercise on a treadmill, which naturally increases the blood flow to your heart, while others who are  unable to walk on a treadmill due to physical limitations have a pharmacologic/chemical stress agent called Lexiscan . This medicine will mimic walking on a treadmill by temporarily increasing your coronary blood flow.   Please note: these test may take anywhere between 2-4 hours to complete  [facility]  THE VOLUNTEERS AT THE FIRST DESK WILL DIRECT YOU WHERE TO GO  Date of Procedure:_______3/8/18  _____________  Arrival Time for Procedure:____07:15 am_________  Instructions regarding medication:   1- You may take your morning medications with a small sip of water the morning of the procedure.    PLEASE NOTIFY THE OFFICE AT LEAST 24 HOURS IN ADVANCE IF YOU ARE UNABLE TO KEEP YOUR APPOINTMENT.  [phone] AND  PLEASE NOTIFY NUCLEAR MEDICINE AT [facility] AT LEAST 24 HOURS IN ADVANCE IF YOU ARE UNABLE TO KEEP  YOUR APPOINTMENT. [phone]  How to prepare for your Myoview test:  1. Do not eat or drink after midnight 2. No caffeine for 24 hours prior to test 3. No smoking 24 hours prior to test. 4. Your medication may be taken with water.  If your doctor stopped a medication because of this test, do not take that medication. 5. Ladies, please do not wear dresses.  Skirts or pants are appropriate. Please wear a short sleeve shirt. 6. No perfume, cologne or lotion. 7. Wear comfortable walking shoes. No heels!    Follow-Up: Your physician recommends that you schedule a follow-up appointment in: 1 MONTH WITH DR END.  If you need a refill on your cardiac medications before your next appointment, please call your pharmacy.   Cardiac Nuclear Scan A cardiac nuclear scan is a test that measures blood flow to the heart when a person is resting and when he or she is exercising. The test looks for problems such as:  Not enough blood reaching a portion of the heart.  The heart muscle not working normally. You may need this test if:  You have heart disease.  You have had abnormal lab results.  You have had heart surgery or angioplasty.  You have chest pain.  You have shortness of breath. In this test, a radioactive dye (tracer) is injected into your bloodstream. After the tracer has traveled to your heart, an imaging device is used to measure how much of the tracer is absorbed by or distributed to various areas of your heart. This procedure is usually done at a  hospital and takes 2-4 hours. Tell a health care provider about:  Any allergies you have.  All medicines you are taking, including vitamins, herbs, eye drops, creams, and over-the-counter medicines.  Any problems you or family members have had with the use of anesthetic medicines.  Any blood disorders you have.  Any surgeries you have had.  Any medical conditions you have.  Whether you are pregnant or may be pregnant. What are  the risks? Generally, this is a safe procedure. However, problems may occur, including:  Serious chest pain and heart attack. This is only a risk if the stress portion of the test is done.  Rapid heartbeat.  Sensation of warmth in your chest. This usually passes quickly. What happens before the procedure?  Ask your health care provider about changing or stopping your regular medicines. This is especially important if you are taking diabetes medicines or blood thinners.  Remove your jewelry on the day of the procedure. What happens during the procedure?  An IV tube will be inserted into one of your veins.  Your health care provider will inject a small amount of radioactive tracer through the tube.  You will wait for 20-40 minutes while the tracer travels through your bloodstream.  Your heart activity will be monitored with an electrocardiogram (ECG).  You will lie down on an exam table.  Images of your heart will be taken for about 15-20 minutes.  You may be asked to exercise on a treadmill or stationary bike. While you exercise, your heart's activity will be monitored with an ECG, and your blood pressure will be checked. If you are unable to exercise, you may be given a medicine to increase blood flow to parts of your heart.  When blood flow to your heart has peaked, a tracer will again be injected through the IV tube.  After 20-40 minutes, you will get back on the exam table and have more images taken of your heart.  When the procedure is over, your IV tube will be removed. The procedure may vary among health care providers and hospitals. Depending on the type of tracer used, scans may need to be repeated 3-4 hours later. What happens after the procedure?  Unless your health care provider tells you otherwise, you may return to your normal schedule, including diet, activities, and medicines.  Unless your health care provider tells you otherwise, you may increase your fluid  intake. This will help flush the contrast dye from your body. Drink enough fluid to keep your urine clear or pale yellow.  It is up to you to get your test results. Ask your health care provider, or the department that is doing the test, when your results will be ready. Summary  A cardiac nuclear scan measures the blood flow to the heart when a person is resting and when he or she is exercising.  You may need this test if you are at risk for heart disease.  Tell your health care provider if you are pregnant.  Unless your health care provider tells you otherwise, increase your fluid intake. This will help flush the contrast dye from your body. Drink enough fluid to keep your urine clear or pale yellow. This information is not intended to replace advice given to you by your health care provider. Make sure you discuss any questions you have with your health care provider. Document Released: 10/04/2004 Document Revised: 09/11/2016 Document Reviewed: 08/18/2013 Elsevier Interactive Patient Education  2017 [employer]

## 2016-11-27 ENCOUNTER — Telehealth: Payer: Self-pay | Admitting: *Deleted

## 2016-11-27 NOTE — Telephone Encounter (Signed)
No answer. Left detailed message, ok per DPR, with Exercise myoview instructions as listed on AVS from last OV and to arrive at 07:15 am on 11/28/16.

## 2016-11-28 ENCOUNTER — Ambulatory Visit
Admission: RE | Admit: 2016-11-28 | Discharge: 2016-11-28 | Disposition: A | Discharge status: Home / Self Care | Payer: BLUE CROSS/BLUE SHIELD | Source: Ambulatory Visit | Attending: Internal Medicine | Admitting: Internal Medicine

## 2016-11-28 DIAGNOSIS — R9439 Abnormal result of other cardiovascular function study: Secondary | ICD-10-CM | POA: Insufficient documentation

## 2016-11-28 DIAGNOSIS — R079 Chest pain, unspecified: Secondary | ICD-10-CM

## 2016-11-28 DIAGNOSIS — I493 Ventricular premature depolarization: Secondary | ICD-10-CM | POA: Insufficient documentation

## 2016-11-28 LAB — NM MYOCAR MULTI W/SPECT W/WALL MOTION / EF
CHL CUP NUCLEAR SRS: 12
CHL CUP STRESS STAGE 1 HR: 81 {beats}/min
CHL CUP STRESS STAGE 2 HR: 80 {beats}/min
CHL CUP STRESS STAGE 3 GRADE: 10 %
CHL CUP STRESS STAGE 3 HR: 139 {beats}/min
CHL CUP STRESS STAGE 3 SPEED: 1.7 mph
CHL CUP STRESS STAGE 4 GRADE: 0 %
CHL CUP STRESS STAGE 4 HR: 102 {beats}/min
CHL CUP STRESS STAGE 4 SPEED: 0 mph
CHL CUP STRESS STAGE 5 HR: 73 {beats}/min
CSEPEW: 4.6 METS
CSEPPBP: 189 mmHg
CSEPPHR: 139 {beats}/min
CSEPPMHR: 84 %
Exercise duration (min): 4 min
Exercise duration (sec): 0 s
LVDIAVOL: 72 mL (ref 46–106)
LVSYSVOL: 26 mL
Percent HR: 85 %
Rest HR: 67 {beats}/min
SDS: 1
SSS: 0
Stage 1 Grade: 0 %
Stage 1 Speed: 0 mph
Stage 2 Grade: 0.1 %
Stage 2 Speed: 0 mph
Stage 3 DBP: 94 mmHg
Stage 3 SBP: 189 mmHg
Stage 5 DBP: 96 mmHg
Stage 5 Grade: 0 %
Stage 5 SBP: 158 mmHg
Stage 5 Speed: 0 mph
TID: 1.2

## 2016-11-28 MED ORDER — TECHNETIUM TC 99M TETROFOSMIN IV KIT
32.6120 | PACK | Freq: Once | INTRAVENOUS | Status: AC | PRN
  Administered 2016-11-28: 32.612 via INTRAVENOUS

## 2016-11-28 MED ORDER — TECHNETIUM TC 99M TETROFOSMIN IV KIT
13.0000 | PACK | Freq: Once | INTRAVENOUS | Status: AC | PRN
  Administered 2016-11-28: 13.31 via INTRAVENOUS

## 2016-11-29 ENCOUNTER — Ambulatory Visit: Payer: BLUE CROSS/BLUE SHIELD | Admitting: Family Medicine

## 2016-12-04 ENCOUNTER — Ambulatory Visit (INDEPENDENT_AMBULATORY_CARE_PROVIDER_SITE_OTHER): Payer: BLUE CROSS/BLUE SHIELD | Admitting: Internal Medicine

## 2016-12-04 ENCOUNTER — Encounter: Payer: Self-pay | Admitting: Internal Medicine

## 2016-12-04 VITALS — BP 144/90 | HR 65 | Ht 61.0 in | Wt 143.2 lb

## 2016-12-04 DIAGNOSIS — R079 Chest pain, unspecified: Secondary | ICD-10-CM

## 2016-12-04 DIAGNOSIS — R9439 Abnormal result of other cardiovascular function study: Secondary | ICD-10-CM

## 2016-12-04 DIAGNOSIS — I10 Essential (primary) hypertension: Secondary | ICD-10-CM

## 2016-12-04 HISTORY — DX: Chest pain, unspecified: R07.9

## 2016-12-04 MED ORDER — AMLODIPINE BESYLATE 5 MG PO TABS: 5.0000 mg | ORAL_TABLET | Freq: Every day | ORAL | 3 refills | Status: DC

## 2016-12-04 MED ORDER — CARVEDILOL 3.125 MG PO TABS: 3.1250 mg | ORAL_TABLET | Freq: Two times a day (BID) | ORAL | 3 refills | Status: DC

## 2016-12-04 NOTE — Patient Instructions (Addendum)
Medication Instructions:  Your physician has recommended you make the following change in your medication:  1- REMAIN taking Amlodipine 5 mg by mouth once a day as you have been doing. 2- START Carvedilol 3.125 mg (1 tablet) by mouth two times a day.   Labwork: none  Testing/Procedures: Your physician has requested that you have cardiac CT (CORONARY CT). Cardiac computed tomography (CT) is a painless test that uses an x-ray machine to take clear, detailed pictures of your heart. For further information please visit [email or web address] Please follow instruction sheet as given.    Follow-Up: Your physician recommends that you schedule a follow-up appointment WITH DR END AFTER THE CORONARY CTA (APPROXIMATELY 3-4 WEEKS).  If you need a refill on your cardiac medications before your next appointment, please call your pharmacy.

## 2016-12-04 NOTE — Progress Notes (Signed)
Follow-up Outpatient Visit Date: 12/04/2016  Primary Care Provider: Baruch GoutyMelinda Lada, MD [address] [place] [id number]  Chief Complaint: Follow-up chest pain and abnormal stress test  HPI:  Ms. Stephanie Howe is a 56 y.o. year-old female with history of HTN, hypothyroidism, COPD, and anemia, who presents for follow-up of chest pain. 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 She experienced accompanying dizziness and nausea, with all symptoms abating spontaneously over the course of 10 minutes. She has chronic exertional dyspnea and chest tightness when walking briskly or climbing stairs, which have been present for over a year. We agreed to perform a myocardial perfusion stress test (see details below), which was abnormal but limited by significant gut activity.  Since our last visit, the patient has not had any furthe episodes of chest pain. She has stable exertional dyspnea. She endorses occasional "dizzy spells" lasting a few seconds with falls or loss of consciousness. At our last visit, we discussed increasing amlodipine from 5 mg to 10 mg daily, which she did not do out of concerns that it could drop her blood pressure too low.  --------------------------------------------------------------------------------------------------  Cardiovascular History & Procedures: Cardiovascular Problems:  Chest pain  Risk Factors:  Hypertension and history of tobacco use  Cath/PCI:  LHC (05/07/02): Angiographically normal coronary arteries. LVEF 60%. LVEDP 19 mmHg.  CV Surgery:  None  EP Procedures and Devices:  None  Non-Invasive Evaluation(s):  Exercise myocardial perfusion stress test (11/28/16): Poor functional capacity (4 min, 0 seconds). There is a medium size, moderate in severity, partial reversible defect involving the mid anterior, apical anterior, and apical  segments suggestive of ischemia. LVEF normal. Study is poor quality due to significant subdiaphragmatic activity (particularly on the stress images).  Exercise tolerance test (2003): Reportedly equivocal.  Recent CV Pertinent Labs: Lab Results  Component Value Date   CHOL 210 (H) 11/15/2016   HDL 88 11/15/2016   LDLCALC 106 (H) 11/15/2016   TRIG 80 11/15/2016   CHOLHDL 2.4 11/15/2016   INR 0.9 07/17/2007   K 4.6 11/15/2016   K 3.8 11/30/2013   BUN 12 11/15/2016   BUN 1 (A) 10/18/2015   BUN 8 11/30/2013   CREATININE 0.72 11/15/2016   CREATININE 0.81 07/03/2016    Past medical and surgical history were reviewed and updated in EPIC.  Outpatient Encounter Prescriptions as of 12/04/2016  Medication Sig  . albuterol (PROVENTIL) (2.5 MG/3ML) 0.083% nebulizer solution Take 3 mLs (2.5 mg total) by nebulization every 4 (four) hours as needed for wheezing or shortness of breath.  Marland Kitchen. amLODipine (NORVASC) 10 MG tablet Take 1 tablet (10 mg total) by mouth daily. (Patient taking differently: Take 5 mg by mouth daily. )  . aspirin EC 81 MG tablet Take 81 mg by mouth daily.  Marland Kitchen. levothyroxine (SYNTHROID, LEVOTHROID) 50 MCG tablet Take 1 1/2 tabs on Monday, Wednesday, and Friday. Take one tablet on Sunday, Tuesday, Thursday, and Saturday.  . nitroGLYCERIN (NITROSTAT) 0.4 MG SL tablet Place 1 tablet (0.4 mg total) under the tongue every 5 (five) minutes as needed for chest pain. Maximum of 3 doses.  . ranitidine (ZANTAC) 300 MG capsule Take 1 capsule (300 mg total) by mouth every evening. For heartburn  . triamcinolone cream (KENALOG) 0.1 % Apply 1 application topically 2 (two) times daily. Too strong for face, under arms, groin; never use on children   No facility-administered  encounter medications on file as of 12/04/2016.     Allergies: Codeine and Penicillins  Social History   Social History  . Marital status: Single    Spouse name: N/A  . Number of children: N/A  . Years of education: N/A     Occupational History  . Not on file.   Social History Main Topics  . Smoking status: Former Smoker    Packs/day: 1.50    Years: 30.00    Types: Cigarettes    Quit date: 09/24/2011  . Smokeless tobacco: Never Used  . Alcohol use No  . Drug use: No  . Sexual activity: Not on file   Other Topics Concern  . Not on file   Social History Narrative  . No narrative on file    Family History  Problem Relation Age of Onset  . COPD Mother   . Hypertension Mother   . Pulmonary embolism Father   . Diabetes Father   . COPD Father   . Hypertension Brother   . Stroke Maternal Grandmother   . Cancer Neg Hx   . Heart disease Neg Hx     Review of Systems: A 12-system review of systems was performed and was negative except as noted in the HPI.  --------------------------------------------------------------------------------------------------  Physical Exam: BP (!) 144/90 (BP Location: Left Arm, Patient Position: Sitting, Cuff Size: Normal)   Pulse 65   Ht 5\' 1"  (1.549 m)   Wt 143 lb 4 oz (65 kg)   BMI 27.07 kg/m   General:  Well-developed, well-nourished woman, seated comfortably in the exam room. She is accompanied by her grandson. HEENT: No conjunctival pallor or scleral icterus.  Moist mucous membranes.  OP clear. Neck: Supple without lymphadenopathy, thyromegaly, JVD, or HJR.  No carotid bruit. Lungs: Normal work of breathing.  Clear to auscultation bilaterally without wheezes or crackles. Heart: Regular rate and rhythm without murmurs, rubs, or gallops.  Non-displaced PMI. Abd: Bowel sounds present.  Soft, NT/ND without hepatosplenomegaly Ext: No lower extremity edema.  Radial, PT, and DP pulses are 2+ bilaterally. Skin: warm and dry without rash.  Lab Results  Component Value Date   WBC 7.6 11/15/2016   HGB 13.4 11/15/2016   HCT 39.4 11/15/2016   MCV 80.8 11/15/2016   PLT 342 11/15/2016    Lab Results  Component Value Date   NA 138 11/15/2016   K 4.6 11/15/2016    CL 104 11/15/2016   CO2 27 11/15/2016   BUN 12 11/15/2016   CREATININE 0.72 11/15/2016   GLUCOSE 89 11/15/2016   ALT 15 11/15/2016    Lab Results  Component Value Date   CHOL 210 (H) 11/15/2016   HDL 88 11/15/2016   LDLCALC 106 (H) 11/15/2016   TRIG 80 11/15/2016   CHOLHDL 2.4 11/15/2016    --------------------------------------------------------------------------------------------------  ASSESSMENT AND PLAN: Chest pain and abnormal stress test Ms. West has not had any further chest pain since our last visit. Exertional dyspnea has been stable for at least a year. Unfortunately, her myocardial perfusion stress test was limited by significant artifact, though there is suspicion for mid/apical anterior ischemia. We discussed further treatment options, including medical therapy alone, cardiac catheterization, and coronary CTA. The patient underwent cardiac cath in 2003 and would like to avoid this if at all possible. We will therefore proceed with coronary CTA. I will add carvedilol 3.125 mg BID for further rate control, blood pressure therapy, and antianginal effects. We will discuss adding a statin after completion of the the  CTA.  Hypertension Blood pressure remains elevated today, though this is not surprising since the patient never increased amlodipine as we discussed at our last visit. We have agreed to keep amlodipine at 5 mg daily and add carvedilol, as detailed above.  Follow-up: Return to clinic in 3-4 weeks (after completion of coronary CTA).  Yvonne Kendall, MD 12/04/2016 7:11 PM

## 2016-12-18 ENCOUNTER — Encounter: Payer: Self-pay | Admitting: Internal Medicine

## 2016-12-18 ENCOUNTER — Telehealth: Payer: Self-pay | Admitting: Internal Medicine

## 2016-12-18 ENCOUNTER — Ambulatory Visit: Payer: BLUE CROSS/BLUE SHIELD | Admitting: Internal Medicine

## 2016-12-18 DIAGNOSIS — R079 Chest pain, unspecified: Secondary | ICD-10-CM

## 2016-12-18 DIAGNOSIS — Z01818 Encounter for other preprocedural examination: Secondary | ICD-10-CM

## 2016-12-18 NOTE — Telephone Encounter (Signed)
Please call patient to schedule ct calcium score  She has an appt on 4 / 4 with end and needs test rior to appt  Patient needs to know ahead of time for work   She also taking a new medication that end rxd (crestor ) and she has had some blood blisters in her mouth and wants to talk about this as well.

## 2016-12-18 NOTE — Telephone Encounter (Signed)
No answer. Left message to call back on patient's number.  Called Neysa BonitoChristy who is patient's daughter [place] on [place]. She said sometimes her mom's phone does not get service. Asked if she could let her mother know to call [phone] and ask for Jasmine DecemberSharon at check out to schedule the CT. Let her know patient would need lab work prior to CT and could go [facility] for that anytime before scheduled CT. She verbalized understanding and will also let the patient know to call our office back if she has any further needs to discuss.

## 2016-12-18 NOTE — Telephone Encounter (Signed)
Pt calling asking if we can call her back  Is also wanting to know how come no one has answered her medication question.  But we have called her about 3-4 times now about scheduling her CT  Please advise.

## 2016-12-18 NOTE — Telephone Encounter (Signed)
Returned call to patient.  Patient c/o "blood blisters" on two occasions that appeared then went away the next day. On was on her lip after eating dinner and the other was inside her mouth towards her jaw.  Both went away by the next day. No drainage per patient. Patient stated she has had cold sores in the past but they did not look like this. She thinks it could be from the carvedilol which was started on 12/04/16.  Advised patient that I did not think these were associated and to contact PCP if it happens again.  She is scheduled for Coronary CT and is aware to [facility] work at her earliest convenience.  She also expressed her frustration with the scheduling process for the CT. Saying it took along long time and that no one called her until today when she mistakenly came to an appt which had been rescheduled. She said she was told someone called her and left a message but she did not have any record of receiving a message. I expressed my concern and apologized for the delay. She was appreciative.

## 2016-12-20 ENCOUNTER — Other Ambulatory Visit
Admission: RE | Admit: 2016-12-20 | Discharge: 2016-12-20 | Disposition: A | Discharge status: Home / Self Care | Payer: BLUE CROSS/BLUE SHIELD | Source: Ambulatory Visit | Attending: Internal Medicine | Admitting: Internal Medicine

## 2016-12-20 DIAGNOSIS — Z01818 Encounter for other preprocedural examination: Secondary | ICD-10-CM | POA: Insufficient documentation

## 2016-12-20 DIAGNOSIS — R079 Chest pain, unspecified: Secondary | ICD-10-CM | POA: Diagnosis present

## 2016-12-20 LAB — BASIC METABOLIC PANEL
ANION GAP: 8 (ref 5–15)
BUN: 14 mg/dL (ref 6–20)
CALCIUM: 9.4 mg/dL (ref 8.9–10.3)
CO2: 25 mmol/L (ref 22–32)
Chloride: 105 mmol/L (ref 101–111)
Creatinine, Ser: 0.75 mg/dL (ref 0.44–1.00)
GFR calc Af Amer: >60 mL/min (ref >60)
Glucose, Bld: 92 mg/dL (ref 65–99)
Potassium: 4.5 mmol/L (ref 3.5–5.1)
SODIUM: 138 mmol/L (ref 135–145)

## 2016-12-25 ENCOUNTER — Ambulatory Visit: Payer: BLUE CROSS/BLUE SHIELD | Admitting: Internal Medicine

## 2017-01-01 ENCOUNTER — Ambulatory Visit (HOSPITAL_COMMUNITY)
Admission: RE | Admit: 2017-01-01 | Discharge: 2017-01-01 | Disposition: A | Discharge status: Home / Self Care | Payer: BLUE CROSS/BLUE SHIELD | Source: Ambulatory Visit | Attending: Internal Medicine | Admitting: Internal Medicine

## 2017-01-01 DIAGNOSIS — R9439 Abnormal result of other cardiovascular function study: Secondary | ICD-10-CM | POA: Insufficient documentation

## 2017-01-01 DIAGNOSIS — R079 Chest pain, unspecified: Secondary | ICD-10-CM | POA: Insufficient documentation

## 2017-01-01 MED ORDER — METOPROLOL TARTRATE 5 MG/5ML IV SOLN
5.0000 mg | Freq: Once | INTRAVENOUS | Status: AC
  Administered 2017-01-01: 5 mg via INTRAVENOUS
  Filled 2017-01-01: qty 5

## 2017-01-01 MED ORDER — IOPAMIDOL (ISOVUE-370) INJECTION 76%
INTRAVENOUS | Status: AC
  Filled 2017-01-01: qty 100

## 2017-01-01 MED ORDER — NITROGLYCERIN 0.4 MG SL SUBL
SUBLINGUAL_TABLET | SUBLINGUAL | Status: AC
  Filled 2017-01-01: qty 2

## 2017-01-01 MED ORDER — METOPROLOL TARTRATE 5 MG/5ML IV SOLN
INTRAVENOUS | Status: AC
  Filled 2017-01-01: qty 5

## 2017-01-01 MED ORDER — NITROGLYCERIN 0.4 MG SL SUBL
0.8000 mg | SUBLINGUAL_TABLET | Freq: Once | SUBLINGUAL | Status: AC
  Administered 2017-01-01: 0.8 mg via SUBLINGUAL
  Filled 2017-01-01: qty 25

## 2017-01-08 ENCOUNTER — Encounter: Payer: Self-pay | Admitting: Internal Medicine

## 2017-01-08 ENCOUNTER — Ambulatory Visit (INDEPENDENT_AMBULATORY_CARE_PROVIDER_SITE_OTHER): Payer: BLUE CROSS/BLUE SHIELD | Admitting: Internal Medicine

## 2017-01-08 ENCOUNTER — Ambulatory Visit: Payer: BLUE CROSS/BLUE SHIELD | Admitting: Internal Medicine

## 2017-01-08 VITALS — BP 118/60 | HR 76 | Ht 61.0 in | Wt 144.5 lb

## 2017-01-08 DIAGNOSIS — I10 Essential (primary) hypertension: Secondary | ICD-10-CM

## 2017-01-08 DIAGNOSIS — R9439 Abnormal result of other cardiovascular function study: Secondary | ICD-10-CM | POA: Diagnosis not present

## 2017-01-08 DIAGNOSIS — R079 Chest pain, unspecified: Secondary | ICD-10-CM

## 2017-01-08 NOTE — Patient Instructions (Signed)
Medication Instructions:  Your physician recommends that you continue on your current medications as directed. Please refer to the Current Medication list given to you today.   Labwork: NONE  Testing/Procedures: NONE  Follow-Up: Your physician recommends that you schedule a follow-up appointment ON AN AS NEEDED BASIS.   If you need a refill on your cardiac medications before your next appointment, please call your pharmacy.

## 2017-01-08 NOTE — Progress Notes (Signed)
Follow-up Outpatient Visit Date: 01/08/2017  Primary Care Provider: Baruch Gouty, MD [address] [place] [address]  Chief Complaint: Follow-up chest pain and abnormal stress test  HPI:  Stephanie Howe is a 56 y.o. year-old female with history of HTN, hypothyroidism, COPD, and anemia, who presents for follow-up of chest pain. I last saw her on 12/04/16. She was feeling better but still complained of intermittent lightheadedness and chest discomfort. Due to inconclusive myocardial perfusion stress test, we agreed to obtain a coronary CTA. This showed no significant CAD or coronary artery calcium. Since her last visit, Stephanie Howe has not had any further chest pain. She notes occasional dizzy spells but has not passed out. She describes the sensation as lightheadedness, though is also feels that it could represent vertigo. She does not have associated symptoms, including palpitations, chest pain, shortness of breath, or tinnitus. There are no clear triggers, and can happen when she is at work or even lying in bed. She notes that her home BP has been relatively well-controlled (SBP 120-130 mmHg). She is tolerating low-dose carvedilol well but wonders if she needs to continue taking this medication.  --------------------------------------------------------------------------------------------------  Cardiovascular History & Procedures: Cardiovascular Problems:  Chest pain  Dizziness  Risk Factors:  Hypertension and history of tobacco use  Cath/PCI:  LHC (05/07/02): Angiographically normal coronary arteries. LVEF 60%. LVEDP 19 mmHg.  CV Surgery:  None  EP Procedures and Devices:  None  Non-Invasive Evaluation(s):  Coronary CTA (01/01/17): Normal right-dominant coronary arteries. Coronary calcium score = 0. Stable subcentimeter right middle lobe nodule.  Exercise myocardial perfusion stress test (11/28/16): Poor functional capacity (4 min, 0 seconds). There is a medium size,  moderate in severity, partial reversible defect involving the mid anterior, apical anterior, and apical segments suggestive of ischemia. LVEF normal. Study is poor quality due to significant subdiaphragmatic activity (particularly on the stress images).  Exercise tolerance test (2003): Reportedly equivocal.  Recent CV Pertinent Labs: Lab Results  Component Value Date   CHOL 210 (H) 11/15/2016   HDL 88 11/15/2016   LDLCALC 106 (H) 11/15/2016   TRIG 80 11/15/2016   CHOLHDL 2.4 11/15/2016   INR 0.9 07/17/2007   K 4.5 12/20/2016   K 3.8 11/30/2013   BUN 14 12/20/2016   BUN 1 (A) 10/18/2015   BUN 8 11/30/2013   CREATININE 0.75 12/20/2016   CREATININE 0.81 07/03/2016    Past medical and surgical history were reviewed and updated in EPIC.  Outpatient Encounter Prescriptions as of 01/08/2017  Medication Sig  . albuterol (PROVENTIL) (2.5 MG/3ML) 0.083% nebulizer solution Take 3 mLs (2.5 mg total) by nebulization every 4 (four) hours as needed for wheezing or shortness of breath.  Marland Kitchen amLODipine (NORVASC) 5 MG tablet Take 1 tablet (5 mg total) by mouth daily.  Marland Kitchen aspirin EC 81 MG tablet Take 81 mg by mouth daily.  . carvedilol (COREG) 3.125 MG tablet Take 1 tablet (3.125 mg total) by mouth 2 (two) times daily.  Marland Kitchen levothyroxine (SYNTHROID, LEVOTHROID) 50 MCG tablet Take 1 1/2 tabs on Monday, Wednesday, and Friday. Take one tablet on Sunday, Tuesday, Thursday, and Saturday.  . nitroGLYCERIN (NITROSTAT) 0.4 MG SL tablet Place 1 tablet (0.4 mg total) under the tongue every 5 (five) minutes as needed for chest pain. Maximum of 3 doses.  . ranitidine (ZANTAC) 300 MG capsule Take 1 capsule (300 mg total) by mouth every evening. For heartburn  . triamcinolone cream (KENALOG) 0.1 % Apply 1 application topically 2 (two)  times daily. Too strong for face, under arms, groin; never use on children   No facility-administered encounter medications on file as of 01/08/2017.     Allergies: Codeine and  Penicillins  Social History   Social History  . Marital status: Single    Spouse name: N/A  . Number of children: N/A  . Years of education: N/A   Occupational History  . Not on file.   Social History Main Topics  . Smoking status: Former Smoker    Packs/day: 1.50    Years: 30.00    Types: Cigarettes    Quit date: 09/24/2011  . Smokeless tobacco: Never Used  . Alcohol use No  . Drug use: No  . Sexual activity: Not on file   Other Topics Concern  . Not on file   Social History Narrative  . No narrative on file    Family History  Problem Relation Age of Onset  . COPD Mother   . Hypertension Mother   . Pulmonary embolism Father   . Diabetes Father   . COPD Father   . Hypertension Brother   . Stroke Maternal Grandmother   . Cancer Neg Hx   . Heart disease Neg Hx     Review of Systems: A 12-system review of systems was performed and was negative except as noted in the HPI.  --------------------------------------------------------------------------------------------------  Physical Exam: BP 118/60 (BP Location: Left Arm, Patient Position: Sitting, Cuff Size: Normal)   Pulse 76   Ht  (1.549 m)   Wt 144 lb 8 oz (65.5 kg)   BMI 27.30 kg/m   General:  Well-developed, well-nourished woman, seated comfortably in the exam room. HEENT: No conjunctival pallor or scleral icterus.  Moist mucous membranes.  OP clear. Neck: Supple without lymphadenopathy, thyromegaly, JVD, or HJR.  No carotid bruit. Lungs: Normal work of breathing.  Clear to auscultation bilaterally without wheezes or crackles. Heart: Regular rate and rhythm without murmurs, rubs, or gallops.  Non-displaced PMI. Abd: Bowel sounds present.  Soft, NT/ND without hepatosplenomegaly Ext: No lower extremity edema.  Radial, PT, and DP pulses are 2+ bilaterally. Skin: warm and dry without rash.  Lab Results  Component Value Date   WBC 7.6 11/15/2016   HGB 13.4 11/15/2016   HCT 39.4 11/15/2016   MCV 80.8  11/15/2016   PLT 342 11/15/2016    Lab Results  Component Value Date   NA 138 12/20/2016   K 4.5 12/20/2016   CL 105 12/20/2016   CO2 25 12/20/2016   BUN 14 12/20/2016   CREATININE 0.75 12/20/2016   GLUCOSE 92 12/20/2016   ALT 15 11/15/2016    Lab Results  Component Value Date   CHOL 210 (H) 11/15/2016   HDL 88 11/15/2016   LDLCALC 106 (H) 11/15/2016   TRIG 80 11/15/2016   CHOLHDL 2.4 11/15/2016    --------------------------------------------------------------------------------------------------  ASSESSMENT AND PLAN: Chest pain and abnormal stress test No further chest pain since our last visit. Coronary CTA reassuring without any stenosis or coronary artery calcium. At this time, we have agreed to defer any further workup and to continue with primary prevention.  Dizziness Symptoms are improving and are difficult to characterize as lightheadedness or vertigo. As she does not have any palpitations, I do not feel that a monitor would be useful. There is no evidence for structural abnormalities based on physical exam, myocardial perfusion stress test, and coronary CTA. We will therefore defer ordering an echo.  Hypertension Blood pressure is well-controlled today. We  will continue with current doses of amlodipine and carvedilol. I will defer further management to Dr. Sherie Don.  Follow-up: Return to clinic as needed if chest pain recurs or new symptoms arise.  Yvonne Kendall, MD 01/09/2017 3:46 PM

## 2017-01-09 ENCOUNTER — Encounter: Payer: Self-pay | Admitting: Internal Medicine

## 2017-03-07 ENCOUNTER — Other Ambulatory Visit: Payer: Self-pay | Admitting: Family Medicine

## 2017-03-07 NOTE — Telephone Encounter (Signed)
Patient needs a refill if she needs blood work the only time she has transportation is on saturdays so she will need to know before we leave her if she needs to get it done at the hospital tomorrow?

## 2017-03-07 NOTE — Telephone Encounter (Signed)
rx approved, sent in; no labs needed for thyroid; last was normal

## 2017-03-07 NOTE — Telephone Encounter (Signed)
Patient Notified

## 2017-05-27 IMAGING — CT CT HEART MORP W/ CTA COR W/ SCORE W/ CA W/CM &/OR W/O CM
1 of 10 series · 1 of 20 positions shown, 2 images · IV contrast (Iodine)
Comparison: Chest CT 10/16/2016

CLINICAL DATA: 55 -year-old female with chest pain.

EXAM:
Cardiac/Coronary  CT
TECHNIQUE: The patient was scanned on a Philips 256 scanner.

[Series 300: locator · axial · 0.35mm/px · z∈[+314,+314]mm · 1 of 1 slices shown, 2 images]
[im 1/1  vessel]
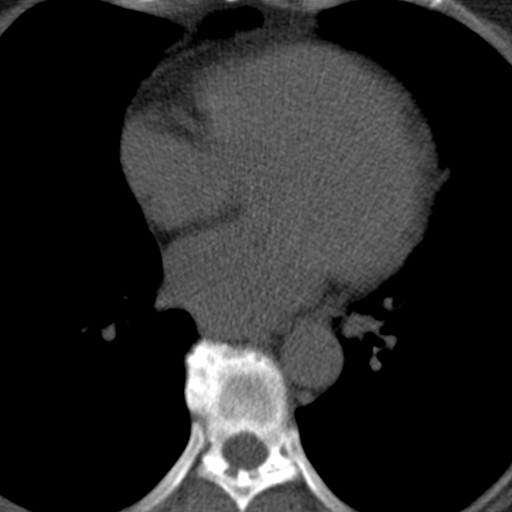
[im 1/1  lung]
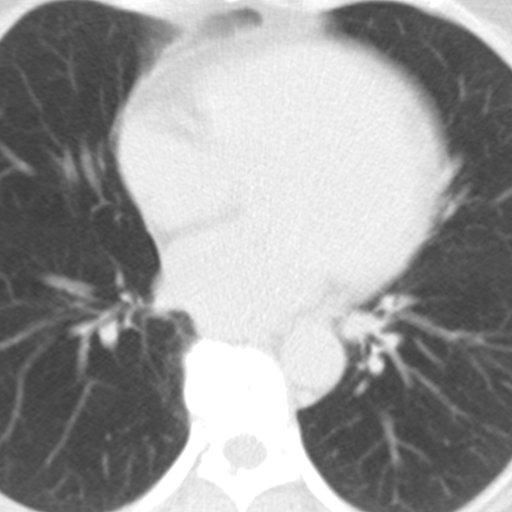

[1 of 20 positions shown; findings below may reference images not displayed]

FINDINGS: A 120 kV prospective scan was triggered in the descending thoracic
aorta at 111 HU's. Axial non-contrast 3 mm slices were carried out
through the heart. The data set was analyzed on a dedicated work
station and scored using the Agatson method. Gantry rotation speed
was 270 msecs and collimation was .9 mm. 5 mg of iv Metoprolol and
0.8 mg of sl NTG was given. The 3D data set was reconstructed in 5%
intervals of the 67-82 % of the R-R cycle. Diastolic phases were
analyzed on a dedicated work station using MPR, MIP and VRT modes.
The patient received 80 cc of contrast.

Aorta:  Normal size.  Trivial calcifications.  No dissection.

Aortic Valve:  Trileaflet.  No calcifications.

Coronary Arteries:  Normal coronary origin.  Right dominance.

RCA is a large dominant artery that gives rise to PDA and PLVB.
There is no plaque.

Left main is a large artery that gives rise to LAD, ramus
intermedius and LCX arteries.

LAD is a medium size artery vessel that has no plaque. Very distal
LAD has a very small lumen.

RI is a very small artery that has no plaque.

LCX is a small non-dominant artery that has no plaque.

Other findings:

Normal pulmonary vein drainage into the left atrium.

Normal let atrial appendage without a thrombus.
IMPRESSION: 1. Coronary calcium score of 0. This was 0 percentile for age and
sex matched control.

2. Normal coronary origin with right dominance.

3. No evidence of CAD. Very distal LAD has a very small lumen with
no obvious plaque.

Angi Billiot

EXAM:
OVER-READ INTERPRETATION  CT CHEST

The following report is an over-read performed by radiologist Dr.
over-read does not include interpretation of cardiac or coronary
anatomy or pathology. The coronary CTA interpretation by the
cardiologist is attached.
FINDINGS: Limited view of the lung parenchyma demonstrates no suspicious
nodularity. Small partially calcified subpleural nodule in the RIGHT
middle lobe (image 43, series 204) is unchanged from screening lung
CT. Airways are normal.

Limited view of the mediastinum demonstrates no adenopathy.
Esophagus normal.

Limited view of the upper abdomen unremarkable.

Limited view of the skeleton and chest wall is unremarkable.
IMPRESSION: No significant extracardiac findings. Stable RIGHT middle lobe
pulmonary nodule.

## 2017-07-18 ENCOUNTER — Other Ambulatory Visit: Payer: Self-pay | Admitting: Family Medicine

## 2017-10-11 ENCOUNTER — Telehealth: Payer: Self-pay | Admitting: Family Medicine

## 2017-10-11 NOTE — Telephone Encounter (Signed)
Patient's last visit was Feb 23rd Please contact her and ask her to schedule a visit around then I'll be glad to refill her medicine Thank you

## 2017-10-14 NOTE — Telephone Encounter (Signed)
LVM to inform pt to call the office and schedule an appt

## 2017-10-16 ENCOUNTER — Telehealth: Payer: Self-pay | Admitting: *Deleted

## 2017-10-16 NOTE — Telephone Encounter (Signed)
Contacted patient to schedule annual lung screening scan. Patient is concerned about amount scan will cost out of pocket. Patient is offered 0 out of pocket with charitable funds available that will cover anything not paid by insurance. Patient refuses ct scan at this time and states she will call me if she changes her mind.

## 2017-10-24 ENCOUNTER — Encounter: Payer: Self-pay | Admitting: Family Medicine

## 2017-10-24 ENCOUNTER — Ambulatory Visit: Payer: BLUE CROSS/BLUE SHIELD | Admitting: Family Medicine

## 2017-10-24 VITALS — BP 144/78 | HR 85 | Temp 98.0°F | Ht 61.0 in | Wt 141.0 lb

## 2017-10-24 DIAGNOSIS — E559 Vitamin D deficiency, unspecified: Secondary | ICD-10-CM | POA: Diagnosis not present

## 2017-10-24 DIAGNOSIS — E038 Other specified hypothyroidism: Secondary | ICD-10-CM | POA: Diagnosis not present

## 2017-10-24 DIAGNOSIS — E782 Mixed hyperlipidemia: Secondary | ICD-10-CM | POA: Diagnosis not present

## 2017-10-24 DIAGNOSIS — I25119 Atherosclerotic heart disease of native coronary artery with unspecified angina pectoris: Secondary | ICD-10-CM | POA: Diagnosis not present

## 2017-10-24 DIAGNOSIS — I10 Essential (primary) hypertension: Secondary | ICD-10-CM | POA: Diagnosis not present

## 2017-10-24 DIAGNOSIS — Z862 Personal history of diseases of the blood and blood-forming organs and certain disorders involving the immune mechanism: Secondary | ICD-10-CM | POA: Diagnosis not present

## 2017-10-24 DIAGNOSIS — Z1231 Encounter for screening mammogram for malignant neoplasm of breast: Secondary | ICD-10-CM | POA: Diagnosis not present

## 2017-10-24 DIAGNOSIS — R234 Changes in skin texture: Secondary | ICD-10-CM | POA: Diagnosis not present

## 2017-10-24 DIAGNOSIS — Z5181 Encounter for therapeutic drug level monitoring: Secondary | ICD-10-CM

## 2017-10-24 DIAGNOSIS — Z1239 Encounter for other screening for malignant neoplasm of breast: Secondary | ICD-10-CM

## 2017-10-24 MED ORDER — CLOBETASOL PROP EMOLLIENT BASE 0.05 % EX CREA: TOPICAL_CREAM | CUTANEOUS | 2 refills | Status: DC

## 2017-10-24 MED ORDER — RANITIDINE HCL 300 MG PO CAPS: ORAL_CAPSULE | ORAL | 11 refills | Status: DC

## 2017-10-24 MED ORDER — AMLODIPINE BESYLATE 5 MG PO TABS: 5.0000 mg | ORAL_TABLET | Freq: Every day | ORAL | 11 refills | Status: DC

## 2017-10-24 MED ORDER — NITROGLYCERIN 0.4 MG SL SUBL: 0.4000 mg | SUBLINGUAL_TABLET | SUBLINGUAL | 3 refills | Status: DC | PRN

## 2017-10-24 NOTE — Assessment & Plan Note (Signed)
Recheck TSH in the next week or two

## 2017-10-24 NOTE — Progress Notes (Signed)
BP (!) 144/78 (BP Location: Right Arm, Patient Position: Sitting, Cuff Size: Normal)   Pulse 85   Temp 98 F (36.7 C) (Oral)   Ht 5\' 1"  (1.549 m)   Wt 141 lb (64 kg)   SpO2 99%   BMI 26.64 kg/m    Subjective:    Patient ID: Stephanie Howe, female    DOB: 1961-08-13, 57 y.o.   MRN: [id number]  HPI: Stephanie Howe is a 57 y.o. female  Chief Complaint  Patient presents with  . Skin Discoloration    RT leg, denies itching or pain been there a few months   . Medication Refill    Pt does NOT want 90 day supply, can't afford   . Hand Problem    Skin cracking on fingers very painful    HPI High blood pressure; no problems with cost or side effects Used to check her BP away from the doctor's   Hypothyroidism; no weight gain; no constipation; might not tolerate dairy products, gets a little diarrhea if eating ice cream; helped by lactaid Lab Results  Component Value Date   TSH 4.451 11/15/2016   Last BMP beautiful  Last LDL just went up a little, 106; was eating hot dogs at the time, now eating better; some oatmeal; very favorable HDL; sister might have high chol, not sure  Hx of CAD; no symptoms, no chest pain; has NTG if needed; taking aspirin daily; not seeing cardiologist, just if needed  Hx of anemia; no bleeding in urine or stools; maybe not enough iron  Fissures in the fingertips; painful  New spot on the right shin; no sx; been there for a few months; not growing, not painful; no injury recalled; has tried lotion  Not interested in flu, hep C, HIV testing  Depression screen [facility] 2/9 10/24/2017 11/15/2016 07/03/2016 05/31/2016 12/27/2015  Decreased Interest 0 0 0 0 0  Down, Depressed, Hopeless 0 0 0 0 0  PHQ - 2 Score 0 0 0 0 0    Relevant past medical, surgical, family and social history reviewed Past Medical History:  Diagnosis Date  . Anemia   . COPD (chronic obstructive pulmonary disease) (HCC)   . Hypertension   . Hypothyroidism   . Overweight   . Vitamin D  deficiency disease    Past Surgical History:  Procedure Laterality Date  . ABDOMINAL HYSTERECTOMY  1994  . BREAST EXCISIONAL BIOPSY Left 10+yrs ago   neg  . COLON SURGERY  2002   colon exploratory surgery  . TONSILLECTOMY  1984   Family History  Problem Relation Age of Onset  . COPD Mother   . Hypertension Mother   . Pulmonary embolism Father   . Diabetes Father   . COPD Father   . Hypertension Brother   . Stroke Maternal Grandmother   . Cancer Neg Hx   . Heart disease Neg Hx    Social History   Tobacco Use  . Smoking status: Former Smoker    Packs/day: 1.50    Years: 30.00    Pack years: 45.00    Types: Cigarettes    Last attempt to quit: 09/24/2011    Years since quitting: 6.0  . Smokeless tobacco: Never Used  Substance Use Topics  . Alcohol use: No  . Drug use: No    Interim medical history since last visit reviewed. Allergies and medications reviewed  Review of Systems Per HPI unless specifically indicated above     Objective:  BP (!) 144/78 (BP Location: Right Arm, Patient Position: Sitting, Cuff Size: Normal)   Pulse 85   Temp 98 F (36.7 C) (Oral)   Ht 5\' 1"  (1.549 m)   Wt 141 lb (64 kg)   SpO2 99%   BMI 26.64 kg/m   Wt Readings from Last 3 Encounters:  10/24/17 141 lb (64 kg)  01/08/17 144 lb 8 oz (65.5 kg)  12/04/16 143 lb 4 oz (65 kg)    Physical Exam  Constitutional: She appears well-developed and well-nourished. No distress.  HENT:  Head: Normocephalic and atraumatic.  Eyes: EOM are normal. No scleral icterus.  Neck: No JVD present. No thyromegaly present.  Cardiovascular: Normal rate and regular rhythm.  Pulmonary/Chest: Effort normal and breath sounds normal. She has no wheezes.  Abdominal: Soft. Bowel sounds are normal. She exhibits no distension.  Musculoskeletal: Normal range of motion. She exhibits no edema.  Neurological: She is alert. She exhibits normal muscle tone.  Skin: Skin is warm and dry. She is not diaphoretic. No  pallor.  Almost heart-shaped macular lesion on the anterior RIGHT shin; about 5 x 5 cm greatest dimensions; very mildly erythematous to salmon colored; no scale; not blanchable; a few fissures at the tips of the fingers  Psychiatric: She has a normal mood and affect. Her mood appears not anxious. She does not exhibit a depressed mood.    Results for orders placed or performed during the hospital encounter of 12/20/16  Basic metabolic panel  Result Value Ref Range   Sodium 138 135 - 145 mmol/L   Potassium 4.5 3.5 - 5.1 mmol/L   Chloride 105 101 - 111 mmol/L   CO2 25 22 - 32 mmol/L   Glucose, Bld 92 65 - 99 mg/dL   BUN 14 6 - 20 mg/dL   Creatinine, Ser 1.61 0.44 - 1.00 mg/dL   Calcium 9.4 8.9 - 09.6 mg/dL   GFR calc non Af Amer >60 >60 mL/min   GFR calc Af Amer >60 >60 mL/min   Anion gap 8 5 - 15      Assessment & Plan:   Problem List Items Addressed This Visit      Cardiovascular and Mediastinum   Essential hypertension    Try DASH guidelines; continue medicine; no med today likely causing today's pressure to be up a little; check BP away from doctor and call if not less than 140/90      Relevant Medications   nitroGLYCERIN (NITROSTAT) 0.4 MG SL tablet   amLODipine (NORVASC) 5 MG tablet   Coronary artery disease    Evaluated by cardiologist; continue aspirin, has NTG if needed      Relevant Medications   nitroGLYCERIN (NITROSTAT) 0.4 MG SL tablet   amLODipine (NORVASC) 5 MG tablet   Other Relevant Orders   CBC with Differential/Platelet     Endocrine   Hypothyroidism    Recheck TSH in the next week or two      Relevant Orders   TSH     Musculoskeletal and Integument   Skin fissures - Primary    Check vit D level; stronger steroid cream (too strong for face, groin, children)        Other   Vitamin D deficiency disease    With fissures in her fingertips; check level if needed      Relevant Orders   VITAMIN D 25 Hydroxy (Vit-D Deficiency, Fractures)    Medication monitoring encounter    Monitor liver and kidneys  Relevant Orders   COMPLETE METABOLIC PANEL WITH GFR   Hyperlipidemia    Check lipids; avoid saturated fats      Relevant Medications   nitroGLYCERIN (NITROSTAT) 0.4 MG SL tablet   amLODipine (NORVASC) 5 MG tablet   Other Relevant Orders   Lipid panel   Hx of iron deficiency anemia    Fatigued, but check CBC to r/o anemia      Relevant Orders   CBC with Differential/Platelet    Other Visit Diagnoses    Screening for breast cancer       Relevant Orders   MM Digital Screening       Follow up plan: No Follow-up on file.  An after-visit summary was printed and given to the patient at check-out.  Please see the patient instructions which may contain other information and recommendations beyond what is mentioned above in the assessment and plan.  Meds ordered this encounter  Medications  . ranitidine (ZANTAC) 300 MG capsule    Sig: TAKE ONE CAPSULE BY MOUTH IN THE EVENING FOR HEARTBURN    Dispense:  30 capsule    Refill:  11    Please consider 90 day supplies to promote better adherence  . nitroGLYCERIN (NITROSTAT) 0.4 MG SL tablet    Sig: Place 1 tablet (0.4 mg total) under the tongue every 5 (five) minutes as needed for chest pain. Maximum of 3 doses.    Dispense:  25 tablet    Refill:  3  . amLODipine (NORVASC) 5 MG tablet    Sig: Take 1 tablet (5 mg total) by mouth daily.    Dispense:  30 tablet    Refill:  11  . Clobetasol Prop Emollient Base (CLOBETASOL PROPIONATE E) 0.05 % emollient cream    Sig: Apply twice a day to cracks; too strong for face, groin, underarms, children    Dispense:  30 g    Refill:  2    Orders Placed This Encounter  Procedures  . MM Digital Screening  . TSH  . Lipid panel  . CBC with Differential/Platelet  . COMPLETE METABOLIC PANEL WITH GFR  . VITAMIN D 25 Hydroxy (Vit-D Deficiency, Fractures)

## 2017-10-24 NOTE — Assessment & Plan Note (Signed)
Try DASH guidelines; continue medicine; no med today likely causing today's pressure to be up a little; check BP away from doctor and call if not less than 140/90

## 2017-10-24 NOTE — Patient Instructions (Signed)
Use the new stronger medicine on your fingertips where the fissures and cracks are You can also use it on the leg for two weeks only; if place has not cleared up, then call and we'll have you see a dermatologist Go to the hospital for fasting labs at your convenience Please do call to schedule your mammogram; the number to schedule one at either [facility] or [facility] is [phone] Try to limit saturated fats in your diet (bologna, hot dogs, barbeque, cheeseburgers, hamburgers, steak, bacon, sausage, cheese, etc.) and get more fresh fruits, vegetables, and whole grains Try to follow the DASH guidelines (DASH stands for Dietary Approaches to Stop Hypertension). Try to limit the sodium in your diet to no more than 1,500mg  of sodium per day. Certainly try to not exceed 2,000 mg per day at the very most. Do not add salt when cooking or at the table.  Check the sodium amount on labels when shopping, and choose items lower in sodium when given a choice. Avoid or limit foods that already contain a lot of sodium. Eat a diet rich in fruits and vegetables and whole grains, and try to lose weight if overweight or obese Check your blood pressure a few times in the next couple of weeks and call me if not to goal (less than 140/90)

## 2017-10-24 NOTE — Assessment & Plan Note (Signed)
Check lipids; avoid saturated fats 

## 2017-10-24 NOTE — Assessment & Plan Note (Signed)
Fatigued, but check CBC to r/o anemia

## 2017-10-24 NOTE — Assessment & Plan Note (Signed)
Evaluated by cardiologist; continue aspirin, has NTG if needed

## 2017-10-24 NOTE — Assessment & Plan Note (Signed)
With fissures in her fingertips; check level if needed

## 2017-10-24 NOTE — Assessment & Plan Note (Signed)
Check vit D level; stronger steroid cream (too strong for face, groin, children)

## 2017-10-24 NOTE — Assessment & Plan Note (Signed)
Monitor liver and kidneys 

## 2017-10-25 ENCOUNTER — Other Ambulatory Visit
Admission: RE | Admit: 2017-10-25 | Discharge: 2017-10-25 | Disposition: A | Discharge status: Home / Self Care | Payer: BLUE CROSS/BLUE SHIELD | Source: Ambulatory Visit | Attending: Family Medicine | Admitting: Family Medicine

## 2017-10-25 DIAGNOSIS — E782 Mixed hyperlipidemia: Secondary | ICD-10-CM | POA: Diagnosis not present

## 2017-10-25 DIAGNOSIS — I25119 Atherosclerotic heart disease of native coronary artery with unspecified angina pectoris: Secondary | ICD-10-CM | POA: Insufficient documentation

## 2017-10-25 DIAGNOSIS — Z862 Personal history of diseases of the blood and blood-forming organs and certain disorders involving the immune mechanism: Secondary | ICD-10-CM | POA: Insufficient documentation

## 2017-10-25 DIAGNOSIS — E038 Other specified hypothyroidism: Secondary | ICD-10-CM | POA: Diagnosis present

## 2017-10-25 DIAGNOSIS — Z5181 Encounter for therapeutic drug level monitoring: Secondary | ICD-10-CM | POA: Diagnosis not present

## 2017-10-25 DIAGNOSIS — E559 Vitamin D deficiency, unspecified: Secondary | ICD-10-CM | POA: Diagnosis not present

## 2017-10-25 LAB — COMPREHENSIVE METABOLIC PANEL
ALK PHOS: 82 U/L (ref 38–126)
ALT: 13 U/L — AB (ref 14–54)
AST: 22 U/L (ref 15–41)
Albumin: 4.4 g/dL (ref 3.5–5.0)
Anion gap: 10 (ref 5–15)
BUN: 19 mg/dL (ref 6–20)
CALCIUM: 9.8 mg/dL (ref 8.9–10.3)
CO2: 25 mmol/L (ref 22–32)
CREATININE: 0.77 mg/dL (ref 0.44–1.00)
Chloride: 103 mmol/L (ref 101–111)
GFR calc non Af Amer: >60 mL/min (ref >60)
Glucose, Bld: 96 mg/dL (ref 65–99)
Potassium: 4.7 mmol/L (ref 3.5–5.1)
Sodium: 138 mmol/L (ref 135–145)
Total Bilirubin: 0.4 mg/dL (ref 0.3–1.2)
Total Protein: 7.9 g/dL (ref 6.5–8.1)

## 2017-10-25 LAB — LIPID PANEL
CHOLESTEROL: 207 mg/dL — AB (ref 0–200)
HDL: 80 mg/dL (ref >40)
LDL Cholesterol: 113 mg/dL — ABNORMAL HIGH (ref 0–99)
Total CHOL/HDL Ratio: 2.6 RATIO
Triglycerides: 72 mg/dL (ref <150)
VLDL: 14 mg/dL (ref 0–40)

## 2017-10-25 LAB — CBC WITH DIFFERENTIAL/PLATELET
Basophils Absolute: 0.1 10*3/uL (ref 0–0.1)
Basophils Relative: 1 %
EOS PCT: 5 %
Eosinophils Absolute: 0.3 10*3/uL (ref 0–0.7)
HCT: 41.3 % (ref 35.0–47.0)
HEMOGLOBIN: 13.4 g/dL (ref 12.0–16.0)
LYMPHS PCT: 20 %
Lymphs Abs: 1.3 10*3/uL (ref 1.0–3.6)
MCH: 26 pg (ref 26.0–34.0)
MCHC: 32.3 g/dL (ref 32.0–36.0)
MCV: 80.3 fL (ref 80.0–100.0)
MONOS PCT: 7 %
Monocytes Absolute: 0.5 10*3/uL (ref 0.2–0.9)
Neutro Abs: 4.5 10*3/uL (ref 1.4–6.5)
Neutrophils Relative %: 67 %
PLATELETS: 356 10*3/uL (ref 150–440)
RBC: 5.14 MIL/uL (ref 3.80–5.20)
RDW: 15.8 % — ABNORMAL HIGH (ref 11.5–14.5)
WBC: 6.6 10*3/uL (ref 3.6–11.0)

## 2017-10-25 LAB — TSH: TSH: 6.446 u[IU]/mL — AB (ref 0.350–4.500)

## 2017-10-27 ENCOUNTER — Telehealth: Payer: Self-pay | Admitting: Family Medicine

## 2017-10-27 ENCOUNTER — Other Ambulatory Visit: Payer: Self-pay | Admitting: Family Medicine

## 2017-10-27 DIAGNOSIS — E038 Other specified hypothyroidism: Secondary | ICD-10-CM

## 2017-10-27 LAB — VITAMIN D 25 HYDROXY (VIT D DEFICIENCY, FRACTURES): Vit D, 25-Hydroxy: 13.8 ng/mL — ABNORMAL LOW (ref 30.0–100.0)

## 2017-10-27 MED ORDER — TRIAMCINOLONE ACETONIDE 0.5 % EX OINT: 1.0000 "application " | TOPICAL_OINTMENT | Freq: Two times a day (BID) | CUTANEOUS | 0 refills | Status: DC

## 2017-10-27 MED ORDER — VITAMIN D (ERGOCALCIFEROL) 1.25 MG (50000 UNIT) PO CAPS: 50000.0000 [IU] | ORAL_CAPSULE | ORAL | 1 refills | Status: AC

## 2017-10-27 MED ORDER — LEVOTHYROXINE SODIUM 75 MCG PO TABS
75.0000 ug | ORAL_TABLET | Freq: Every day | ORAL | 1 refills | Status: DC
Start: 2017-10-27 — End: 2017-10-28

## 2017-10-27 NOTE — Telephone Encounter (Signed)
Clobetasol was too $$$ for patient, $145.83 for 60 grams New Rx sent

## 2017-10-27 NOTE — Progress Notes (Signed)
Start Rx vit D weekly x 8 weeks, then consider monthly vs OTC Patient to let me know how her skin fissures are doing Increase thyroid med; recheck TSH in 6-8 weeks, ordered

## 2017-10-28 ENCOUNTER — Telehealth: Payer: Self-pay

## 2017-10-28 MED ORDER — LEVOTHYROXINE SODIUM 50 MCG PO TABS: ORAL_TABLET | ORAL | 1 refills | Status: DC

## 2017-10-28 NOTE — Telephone Encounter (Signed)
Okay, I'll continue the 50 mcg strength pills and adjust the instructions Cancel the 75 mcg pills She will still need to have the TSH rechecked in 6-8 weeks Thank you

## 2017-10-28 NOTE — Telephone Encounter (Signed)
Copied from CRM [phone]. Topic: General - Other >> Oct 28, 2017  8:53 AM Gerrianne ScalePayne, Angela L wrote: Reason for CRM: patient states that she can't take the levothyroxine (SYNTHROID, LEVOTHROID) 75 MCG tablet because it makes her heart race when its that high of a dose and will only take the 50 mcg tablet she states that she take 1 pill on Sat Sun Tues Thurs and 1 1/2 pill on Mon Wed Fri she would like for someone to give her a call back before 11:00 this morning she has to be at work  [employer]ed pt back, states still can not take this dose more that three days a week because it will cause rapid heart flutters and she refuses to take it. Please call pt to discuss. Would like you to call her in the early am.

## 2017-10-29 NOTE — Telephone Encounter (Signed)
Copied from CRM [phone]. Topic: General - Other >> Oct 28, 2017  8:53 AM Gerrianne ScalePayne, Angela L wrote: Reason for CRM: patient states that she can't take the levothyroxine (SYNTHROID, LEVOTHROID) 75 MCG tablet because it makes her heart race when its that high of a dose and will only take the 50 mcg tablet she states that she take 1 pill on Sat Sun Tues Thurs and 1 1/2 pill on Mon Wed Fri she would like for someone to give her a call back before 11:00 this morning she has to be at work  >> Oct 29, 2017 12:04 PM Carin PrimroseWalter, Linda F wrote: Patient called and said she got the message that the doctor called but the telephone call didn't come through on her end.  She said she must have been in an area that it wouldn't come through.  She would like for the doctor to please call her back, because she only wants to talk to the doctor, not any nurse.

## 2017-10-29 NOTE — Telephone Encounter (Signed)
I sent in a new prescription yesterday I called patient today to explain new instructions Recheck TSH in 6-8 weeks Call before with any issues

## 2017-10-30 ENCOUNTER — Other Ambulatory Visit: Payer: Self-pay | Admitting: Family Medicine

## 2017-10-30 MED ORDER — LEVOTHYROXINE SODIUM 50 MCG PO TABS: ORAL_TABLET | ORAL | 5 refills | Status: DC

## 2017-10-30 NOTE — Telephone Encounter (Signed)
I returned her call A few years ago, she will try the 1.5 tablets just three days a week Does not think she can tolerate the higher dose, because of palpitations Recheck TSH in 3 months, no appt needed, just use order in chart

## 2018-01-02 ENCOUNTER — Telehealth: Payer: Self-pay | Admitting: Family Medicine

## 2018-01-02 DIAGNOSIS — E038 Other specified hypothyroidism: Secondary | ICD-10-CM

## 2018-01-02 DIAGNOSIS — R234 Changes in skin texture: Secondary | ICD-10-CM

## 2018-01-02 NOTE — Telephone Encounter (Signed)
Copied from CRM [phone]. Topic: Quick Communication - See Telephone Encounter >> Jan 02, 2018  8:11 AM Cipriano BunkerLambe, Annette S wrote: CRM for notification.   Pt. Is asking if the order is put in for her blood work she needs done tomorrow 4/13 for her Vitamin D. [facility]   Please call pt. And let her know order is in   See Telephone encounter for: 01/02/18.

## 2018-01-02 NOTE — Telephone Encounter (Signed)
Please refer to dermatologist

## 2018-01-02 NOTE — Telephone Encounter (Signed)
Called pt informed her that lab orders have been placed for the hospital so she may go over the weekend at her convenience. Pt gave verbal understanding. Also inquired about pt's skin fissures on fingers. She states that they have not gotten any better and are doing about the same. Will inform provider.

## 2018-01-03 ENCOUNTER — Other Ambulatory Visit
Admission: RE | Admit: 2018-01-03 | Discharge: 2018-01-03 | Disposition: A | Discharge status: Home / Self Care | Payer: BLUE CROSS/BLUE SHIELD | Source: Ambulatory Visit | Attending: Family Medicine | Admitting: Family Medicine

## 2018-02-28 ENCOUNTER — Other Ambulatory Visit: Payer: Self-pay | Admitting: Family Medicine

## 2018-05-29 ENCOUNTER — Other Ambulatory Visit: Payer: Self-pay | Admitting: Family Medicine

## 2018-05-29 NOTE — Telephone Encounter (Signed)
Please call patient to come in for lab work for further refills. Given 10 pills.

## 2018-05-29 NOTE — Telephone Encounter (Signed)
Last OV 10/24/17

## 2018-06-01 ENCOUNTER — Telehealth: Payer: Self-pay | Admitting: Family Medicine

## 2018-06-01 NOTE — Telephone Encounter (Signed)
Pt called wants to know if you really wanted labs.  States You tried to change thyroid dosage in past and it did not work for her and states never wants to change dosage.  So in that case do we need to do labs?

## 2018-06-01 NOTE — Telephone Encounter (Signed)
Yes, the thyroid medicine needs to be monitored Thank you

## 2018-06-01 NOTE — Telephone Encounter (Signed)
Copied from CRM [id number]. Topic: Inquiry >> Jun 01, 2018  2:26 PM Alexander Bergeron B wrote: Reason for CRM: pt wants to speak about her thyroid w/ pcp nurse; contact to advise; pt goes to work @ 5 and its hard for her to answer her phone while at work

## 2018-06-02 NOTE — Telephone Encounter (Signed)
Pt.notified

## 2018-06-06 ENCOUNTER — Other Ambulatory Visit
Admission: RE | Admit: 2018-06-06 | Discharge: 2018-06-06 | Disposition: A | Discharge status: Home / Self Care | Payer: BLUE CROSS/BLUE SHIELD | Source: Ambulatory Visit | Attending: Family Medicine | Admitting: Family Medicine

## 2018-06-06 DIAGNOSIS — E038 Other specified hypothyroidism: Secondary | ICD-10-CM | POA: Insufficient documentation

## 2018-06-06 LAB — TSH: TSH: 3.619 u[IU]/mL (ref 0.350–4.500)

## 2018-06-08 ENCOUNTER — Other Ambulatory Visit: Payer: Self-pay | Admitting: Family Medicine

## 2018-06-08 MED ORDER — LEVOTHYROXINE SODIUM 50 MCG PO TABS: ORAL_TABLET | ORAL | 11 refills | Status: DC

## 2018-06-08 NOTE — Progress Notes (Signed)
Refills sent of thyroid med

## 2018-12-05 ENCOUNTER — Telehealth: Payer: Self-pay | Admitting: Family Medicine

## 2018-12-06 NOTE — Telephone Encounter (Signed)
Patient has not been seen in over a year Please schedule appointment within the next few weeks I'll send in refill and we look forward to seeing her soon

## 2018-12-07 NOTE — Telephone Encounter (Signed)
Spoke with pt and she will return our call once she speak with her daughter

## 2019-01-23 ENCOUNTER — Other Ambulatory Visit: Payer: Self-pay | Admitting: Family Medicine

## 2019-01-25 NOTE — Telephone Encounter (Signed)
LVM to schedule Please schedule patient for follow up in the next 14 days

## 2019-01-25 NOTE — Telephone Encounter (Signed)
Please schedule patient for follow up in the next 14 days.  

## 2019-02-05 ENCOUNTER — Other Ambulatory Visit
Admission: RE | Admit: 2019-02-05 | Discharge: 2019-02-05 | Disposition: A | Discharge status: Home / Self Care | Payer: BLUE CROSS/BLUE SHIELD | Source: Ambulatory Visit | Attending: Nurse Practitioner | Admitting: Nurse Practitioner

## 2019-02-05 ENCOUNTER — Encounter: Payer: Self-pay | Admitting: Nurse Practitioner

## 2019-02-05 ENCOUNTER — Other Ambulatory Visit: Payer: Self-pay

## 2019-02-05 ENCOUNTER — Ambulatory Visit (INDEPENDENT_AMBULATORY_CARE_PROVIDER_SITE_OTHER): Payer: BLUE CROSS/BLUE SHIELD | Admitting: Nurse Practitioner

## 2019-02-05 DIAGNOSIS — K2100 Gastro-esophageal reflux disease with esophagitis, without bleeding: Secondary | ICD-10-CM

## 2019-02-05 DIAGNOSIS — E782 Mixed hyperlipidemia: Secondary | ICD-10-CM

## 2019-02-05 DIAGNOSIS — E038 Other specified hypothyroidism: Secondary | ICD-10-CM | POA: Diagnosis present

## 2019-02-05 DIAGNOSIS — E559 Vitamin D deficiency, unspecified: Secondary | ICD-10-CM

## 2019-02-05 DIAGNOSIS — I10 Essential (primary) hypertension: Secondary | ICD-10-CM

## 2019-02-05 LAB — COMPREHENSIVE METABOLIC PANEL
ALT: 12 U/L (ref 0–44)
AST: 21 U/L (ref 15–41)
Albumin: 4.4 g/dL (ref 3.5–5.0)
Alkaline Phosphatase: 77 U/L (ref 38–126)
Anion gap: 7 (ref 5–15)
BUN: 12 mg/dL (ref 6–20)
CO2: 27 mmol/L (ref 22–32)
Calcium: 9.1 mg/dL (ref 8.9–10.3)
Chloride: 103 mmol/L (ref 98–111)
Creatinine, Ser: 0.83 mg/dL (ref 0.44–1.00)
GFR calc Af Amer: >60 mL/min (ref >60)
GFR calc non Af Amer: >60 mL/min (ref >60)
Glucose, Bld: 99 mg/dL (ref 70–99)
Potassium: 3.9 mmol/L (ref 3.5–5.1)
Sodium: 137 mmol/L (ref 135–145)
Total Bilirubin: 0.4 mg/dL (ref 0.3–1.2)
Total Protein: 7.6 g/dL (ref 6.5–8.1)

## 2019-02-05 LAB — LIPID PANEL
Cholesterol: 216 mg/dL — ABNORMAL HIGH (ref 0–200)
HDL: 93 mg/dL (ref >40)
LDL Cholesterol: 99 mg/dL (ref 0–99)
Total CHOL/HDL Ratio: 2.3 RATIO
Triglycerides: 121 mg/dL (ref <150)
VLDL: 24 mg/dL (ref 0–40)

## 2019-02-05 LAB — TSH: TSH: 5.645 u[IU]/mL — ABNORMAL HIGH (ref 0.350–4.500)

## 2019-02-05 MED ORDER — LEVOTHYROXINE SODIUM 50 MCG PO TABS: ORAL_TABLET | ORAL | 3 refills | Status: DC

## 2019-02-05 MED ORDER — FAMOTIDINE 20 MG PO TABS: 20.0000 mg | ORAL_TABLET | Freq: Every day | ORAL | 3 refills | Status: DC

## 2019-02-05 MED ORDER — AMLODIPINE BESYLATE 5 MG PO TABS: 5.0000 mg | ORAL_TABLET | Freq: Every day | ORAL | 3 refills | Status: DC

## 2019-02-05 NOTE — Progress Notes (Signed)
Virtual Visit via Telephone Note  I connected with@ on 02/05/19 at  3:00 PM EDT by telephone and verified that I am speaking with the correct person using two identifiers.   Staff discussed the limitations, risks, security and privacy concerns of performing an evaluation and management service by telephone and the availability of in person appointments. Staff also discussed with the patient that there may be a patient responsible charge related to this service. The patient expressed understanding and agreed to proceed.  Patients location: home My location: home office  Other people in meeting: none    HPI  Mixed hyperlipidemia Patient is not currently on supplements or medications Eats a lot of vegetables, chicken and bologna  Lab Results  Component Value Date   CHOL 207 (H) 10/25/2017   HDL 80 10/25/2017   LDLCALC 113 (H) 10/25/2017   TRIG 72 10/25/2017   CHOLHDL 2.6 10/25/2017     Vitamin D deficiency disease States bought vitamin 2,000 IU D3 OTC supplementation but hasnt started it yet.   Essential hypertension rx amlodipine 5mg  States typically at home its around 130/80's Denies headaches, blurry vision, chest pain,  BP Readings from Last 3 Encounters:  10/24/17 (!) 144/78  01/08/17 118/60  01/01/17 126/62    hypothyroidism rx synthroid 1.5 tablets three days a week and one tablet on the other days. States she has no missed doses, tried to adjust this dose in the past but it caused issues. Denies cold/heat intolerance, changes in sleep, dry brittle nails.   GERD Uses famotidine PRN   PHQ2/9: Depression screen [facility] 2/9 02/05/2019 10/24/2017 11/15/2016 07/03/2016 05/31/2016  Decreased Interest 0 0 0 0 0  Down, Depressed, Hopeless 0 0 0 0 0  PHQ - 2 Score 0 0 0 0 0  Altered sleeping 0 - - - -  Tired, decreased energy 0 - - - -  Change in appetite 0 - - - -  Feeling bad or failure about yourself  0 - - - -  Trouble concentrating 0 - - - -  Moving slowly or  fidgety/restless 0 - - - -  Suicidal thoughts 0 - - - -  PHQ-9 Score 0 - - - -  Difficult doing work/chores Not difficult at all - - - -   PHQ reviewed. Negative  Patient Active Problem List   Diagnosis Date Noted  . Chest pain 12/04/2016  . Chronic dermatitis of hands 11/15/2016  . Hx of iron deficiency anemia 11/15/2016  . Coronary artery disease 11/15/2016  . Personal history of tobacco use, presenting hazards to health 10/17/2016  . Preventative health care 07/03/2016  . Heartburn 07/03/2016  . Hx of tobacco use, presenting hazards to health 07/03/2016  . Early menopause 07/03/2016  . Cough 05/31/2016  . Medication monitoring encounter 12/27/2015  . Hyperlipidemia 12/27/2015  . Skin fissures 11/29/2015  . Bright red rectal bleeding 11/29/2015  . Essential hypertension   . COPD (chronic obstructive pulmonary disease) (HCC)   . Hypothyroidism   . Vitamin D deficiency disease   . Stone in kidney   . 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 THUMB PAIN, RIGHT 06/23/2008  . 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 Anemia   . COPD (chronic obstructive pulmonary disease) (HCC)   . Hypertension   . Hypothyroidism   . Overweight   . Vitamin D deficiency disease     Past Surgical History:  Procedure Laterality Date  . ABDOMINAL HYSTERECTOMY  1994  .  BREAST EXCISIONAL BIOPSY Left 10+yrs ago   neg  . COLON SURGERY  2002   colon exploratory surgery  . TONSILLECTOMY  1984    Social History   Tobacco Use  . Smoking status: Former Smoker    Packs/day: 1.50    Years: 30.00    Pack years: 45.00    Types: Cigarettes    Last attempt to quit: 09/24/2011    Years since quitting: 7.3  . Smokeless tobacco: Never Used  Substance Use Topics  . Alcohol use: No     Current Outpatient Medications:  .  amLODipine (NORVASC) 5 MG tablet, Take 1 tablet by mouth once daily, Disp: 7 tablet, Rfl: 0 .  aspirin EC 81 MG tablet, Take 81 mg by mouth daily.,  Disp: , Rfl:  .  levothyroxine (SYNTHROID, LEVOTHROID) 50 MCG tablet, TAKE ONE AND ONE-HALF TABLET BY MOUTH ON MONDAY, WEDNESDAY, AND FRIDAY. TAKE ONE TABLET ON SUNDAY, TUESDAY, THURSDAY, AND SATURDAY, Disp: 39 tablet, Rfl: 11 .  ranitidine (ZANTAC) 300 MG capsule, TAKE ONE CAPSULE BY MOUTH IN THE EVENING FOR HEARTBURN, Disp: 30 capsule, Rfl: 11 .  albuterol (PROVENTIL) (2.5 MG/3ML) 0.083% nebulizer solution, Take 3 mLs (2.5 mg total) by nebulization every 4 (four) hours as needed for wheezing or shortness of breath. (Patient not taking: Reported on 02/05/2019), Disp: 50 vial, Rfl: 1 .  nitroGLYCERIN (NITROSTAT) 0.4 MG SL tablet, Place 1 tablet (0.4 mg total) under the tongue every 5 (five) minutes as needed for chest pain. Maximum of 3 doses., Disp: 25 tablet, Rfl: 3 .  triamcinolone ointment (KENALOG) 0.5 %, Apply 1 application topically 2 (two) times daily. (Patient not taking: Reported on 02/05/2019), Disp: 30 g, Rfl: 0  Allergies  Allergen Reactions  . Codeine   . Penicillins     ROS  No other specific complaints in a complete review of systems (except as listed in HPI above).  Objective  There were no vitals filed for this visit.   There is no height or weight on file to calculate BMI.  Patient is alert, able to speak in full sentences without difficulty.   Assessment & Plan  1. Mixed hyperlipidemia Diet  - Lipid Profile; Future  2. Vitamin D deficiency disease Start supplementation   3. Essential hypertension Stable continue meds, DASH - Comprehensive Metabolic Panel (CMET); Future - amLODipine (NORVASC) 5 MG tablet; Take 1 tablet (5 mg total) by mouth daily.  Dispense: 90 tablet; Refill: 3  4. Other specified hypothyroidism - TSH; Future - levothyroxine (SYNTHROID) 50 MCG tablet; TAKE ONE AND ONE-HALF TABLET BY MOUTH ON MONDAY, WEDNESDAY, AND FRIDAY. TAKE ONE TABLET ON SUNDAY, TUESDAY, THURSDAY, AND SATURDAY  Dispense: 117 tablet; Refill: 3  5. Gastroesophageal  reflux disease with esophagitis - famotidine (PEPCID) 20 MG tablet; Take 1 tablet (20 mg total) by mouth daily.  Dispense: 90 tablet; Refill: 3      I discussed the assessment and treatment plan with the patient. The patient was provided an opportunity to ask questions and all were answered. The patient agreed with the plan and demonstrated an understanding of the instructions.   The patient was advised to call back or seek an in-person evaluation if the symptoms worsen or if the condition fails to improve as anticipated.  I provided 14 minutes of non-face-to-face time during this encounter.   Cheryle HorsfallElizabeth E Bernestine Holsapple, NP

## 2019-05-12 ENCOUNTER — Telehealth: Payer: Self-pay | Admitting: Family Medicine

## 2019-05-12 NOTE — Telephone Encounter (Unsigned)
Copied from [place] [id number]. Topic: General - Other >> May 12, 2019 12:59 PM Mcneil, Ja-Kwan wrote: Reason for CRM: Pt stated she continues to have pain in her left leg and she would like for a nurse to call her back after 5 pm to discuss her concerns. Cb# [phone]

## 2019-05-18 ENCOUNTER — Ambulatory Visit
Admission: RE | Admit: 2019-05-18 | Discharge: 2019-05-18 | Disposition: A | Discharge status: Home / Self Care | Payer: BLUE CROSS/BLUE SHIELD | Source: Ambulatory Visit | Attending: Family Medicine | Admitting: Family Medicine

## 2019-05-18 ENCOUNTER — Ambulatory Visit (INDEPENDENT_AMBULATORY_CARE_PROVIDER_SITE_OTHER): Payer: BLUE CROSS/BLUE SHIELD | Admitting: Family Medicine

## 2019-05-18 ENCOUNTER — Encounter: Payer: Self-pay | Admitting: Family Medicine

## 2019-05-18 ENCOUNTER — Other Ambulatory Visit: Payer: Self-pay

## 2019-05-18 ENCOUNTER — Other Ambulatory Visit
Admission: RE | Admit: 2019-05-18 | Discharge: 2019-05-18 | Disposition: A | Discharge status: Home / Self Care | Payer: BLUE CROSS/BLUE SHIELD | Source: Ambulatory Visit | Attending: Family Medicine | Admitting: Family Medicine

## 2019-05-18 ENCOUNTER — Other Ambulatory Visit: Admission: RE | Admit: 2019-05-18 | Payer: BLUE CROSS/BLUE SHIELD | Source: Ambulatory Visit | Admitting: *Deleted

## 2019-05-18 VITALS — BP 146/82 | HR 83 | Temp 97.5°F | Resp 14 | Ht 61.0 in | Wt 137.4 lb

## 2019-05-18 DIAGNOSIS — E038 Other specified hypothyroidism: Secondary | ICD-10-CM | POA: Diagnosis not present

## 2019-05-18 DIAGNOSIS — M25552 Pain in left hip: Secondary | ICD-10-CM | POA: Insufficient documentation

## 2019-05-18 DIAGNOSIS — R7989 Other specified abnormal findings of blood chemistry: Secondary | ICD-10-CM

## 2019-05-18 DIAGNOSIS — M79605 Pain in left leg: Secondary | ICD-10-CM

## 2019-05-18 DIAGNOSIS — E559 Vitamin D deficiency, unspecified: Secondary | ICD-10-CM

## 2019-05-18 DIAGNOSIS — M5442 Lumbago with sciatica, left side: Secondary | ICD-10-CM

## 2019-05-18 DIAGNOSIS — K2100 Gastro-esophageal reflux disease with esophagitis, without bleeding: Secondary | ICD-10-CM | POA: Insufficient documentation

## 2019-05-18 DIAGNOSIS — M791 Myalgia, unspecified site: Secondary | ICD-10-CM | POA: Diagnosis not present

## 2019-05-18 DIAGNOSIS — E782 Mixed hyperlipidemia: Secondary | ICD-10-CM | POA: Insufficient documentation

## 2019-05-18 DIAGNOSIS — J449 Chronic obstructive pulmonary disease, unspecified: Secondary | ICD-10-CM

## 2019-05-18 DIAGNOSIS — I700 Atherosclerosis of aorta: Secondary | ICD-10-CM

## 2019-05-18 DIAGNOSIS — Z87891 Personal history of nicotine dependence: Secondary | ICD-10-CM

## 2019-05-18 DIAGNOSIS — I10 Essential (primary) hypertension: Secondary | ICD-10-CM

## 2019-05-18 LAB — CK: Total CK: 122 U/L (ref 38–234)

## 2019-05-18 LAB — COMPREHENSIVE METABOLIC PANEL
ALT: 12 U/L (ref 0–44)
AST: 20 U/L (ref 15–41)
Albumin: 4.5 g/dL (ref 3.5–5.0)
Alkaline Phosphatase: 88 U/L (ref 38–126)
Anion gap: 11 (ref 5–15)
BUN: 10 mg/dL (ref 6–20)
CO2: 25 mmol/L (ref 22–32)
Calcium: 9.4 mg/dL (ref 8.9–10.3)
Chloride: 102 mmol/L (ref 98–111)
Creatinine, Ser: 0.68 mg/dL (ref 0.44–1.00)
GFR calc Af Amer: >60 mL/min (ref >60)
GFR calc non Af Amer: >60 mL/min (ref >60)
Glucose, Bld: 90 mg/dL (ref 70–99)
Potassium: 4.2 mmol/L (ref 3.5–5.1)
Sodium: 138 mmol/L (ref 135–145)
Total Bilirubin: 0.4 mg/dL (ref 0.3–1.2)
Total Protein: 7.7 g/dL (ref 6.5–8.1)

## 2019-05-18 LAB — MAGNESIUM: Magnesium: 2.3 mg/dL (ref 1.7–2.4)

## 2019-05-18 LAB — LIPID PANEL
Cholesterol: 225 mg/dL — ABNORMAL HIGH (ref 0–200)
HDL: 87 mg/dL (ref >40)
LDL Cholesterol: 117 mg/dL — ABNORMAL HIGH (ref 0–99)
Total CHOL/HDL Ratio: 2.6 RATIO
Triglycerides: 103 mg/dL (ref <150)
VLDL: 21 mg/dL (ref 0–40)

## 2019-05-18 LAB — TSH: TSH: 6.821 u[IU]/mL — ABNORMAL HIGH (ref 0.350–4.500)

## 2019-05-18 MED ORDER — FAMOTIDINE 20 MG PO TABS: 20.0000 mg | ORAL_TABLET | Freq: Every day | ORAL | 3 refills | Status: DC | PRN

## 2019-05-18 MED ORDER — PREDNISONE 20 MG PO TABS: 40.0000 mg | ORAL_TABLET | Freq: Every day | ORAL | 0 refills | Status: AC

## 2019-05-18 MED ORDER — NAPROXEN 500 MG PO TABS
500.0000 mg | ORAL_TABLET | Freq: Two times a day (BID) | ORAL | 0 refills | Status: DC
Start: 2019-05-18 — End: 2019-05-18

## 2019-05-18 MED ORDER — AMLODIPINE BESYLATE 5 MG PO TABS: 5.0000 mg | ORAL_TABLET | Freq: Every day | ORAL | 3 refills | Status: DC

## 2019-05-18 MED ORDER — NAPROXEN 500 MG PO TABS: 500.0000 mg | ORAL_TABLET | Freq: Two times a day (BID) | ORAL | 0 refills | Status: DC | PRN

## 2019-05-18 NOTE — Patient Instructions (Addendum)
Calcium 1200 mg daily and Vit D 1000-2000IU per day - better absorbed in two doses   Piriformis Syndrome Rehab Ask your health care provider which exercises are safe for you. Do exercises exactly as told by your health care provider and adjust them as directed. It is normal to feel mild stretching, pulling, tightness, or discomfort as you do these exercises. Stop right away if you feel sudden pain or your pain gets worse. Do not begin these exercises until told by your health care provider. Stretching and range-of-motion exercises These exercises warm up your muscles and joints and improve the movement and flexibility of your hip and pelvis. The exercises also help to relieve pain, numbness, and tingling. Hip rotation This is an exercise in which you lie on your back and stretch the muscles that rotate your hip (hip rotators) to stretch your buttocks. 1. Lie on your back on a firm surface. 2. Pull your left / right knee toward your same shoulder with your left / right hand until your knee is pointing toward the ceiling. Hold your left / right ankle with your other hand. 3. Keeping your knee steady, gently pull your left / right ankle toward your other shoulder until you feel a stretch in your buttocks. 4. Hold this position for __________ seconds. Repeat __________ times. Complete this exercise __________ times a day. Hip extensor This is an exercise in which you lie on your back and pull your knee to your chest. 1. Lie on your back on a firm surface. Both of your legs should be straight. 2. Pull your left / right knee to your chest. Hold your leg in this position by holding onto the back of your thigh or the front of your knee. 3. Hold this position for __________ seconds. 4. Slowly return to the starting position. Repeat __________ times. Complete this exercise __________ times a day. Strengthening exercises These exercises build strength and endurance in your hip and thigh muscles. Endurance  is the ability to use your muscles for a long time, even after they get tired. Straight leg raises, side-lying This exercise strengthens the muscles that rotate the leg at the hip and move it away from your body (hip abductors). 1. Lie on your side with your left / right leg in the top position. Lie so your head, shoulder, knee, and hip line up. Bend your bottom knee to help you balance. 2. Lift your top leg 4-6 inches (10-15 cm) while keeping your toes pointed straight ahead. 3. Hold this position for __________ seconds. 4. Slowly lower your leg to the starting position. 5. Let your muscles relax completely after each repetition. Repeat __________ times. Complete this exercise __________ times a day. Hip abduction and rotation This is sometimes called quadruped (on hands and knees) exercises. 1. Get on your hands and knees on a firm, lightly padded surface. Your hands should be directly below your shoulders, and your knees should be directly below your hips. 2. Lift your left / right knee out to the side. Keep your knee bent. Do not twist your body. 3. Hold this position for __________ seconds. 4. Slowly lower your leg. Repeat __________ times. Complete this exercise __________ times a day. Straight leg raises, face-down This exercise stretches the muscles that move your hips away from the front of the pelvis (hip extensors). 1. Lie on your abdomen on a bed or a firm surface with a pillow under your hips. 2. Squeeze your buttocks muscles and lift your left /  right leg about 4-6 inches (10-15 cm) off the bed. Do not let your back arch. 3. Hold this position for __________ seconds. 4. Slowly lower your leg to the starting position. 5. Let your muscles relax completely after each repetition. Repeat __________ times. Complete this exercise __________ times a day. This information is not intended to replace advice given to you by your health care provider. Make sure you discuss any questions you  have with your health care provider. Document Released: 09/09/2005 Document Revised: 12/31/2018 Document Reviewed: 07/02/2018 [employer] Patient Education  2020 [employer]   Sciatica Rehab Ask your health care provider which exercises are safe for you. Do exercises exactly as told by your health care provider and adjust them as directed. It is normal to feel mild stretching, pulling, tightness, or discomfort as you do these exercises. Stop right away if you feel sudden pain or your pain gets worse. Do not begin these exercises until told by your health care provider. Stretching and range-of-motion exercises These exercises warm up your muscles and joints and improve the movement and flexibility of your hips and back. These exercises also help to relieve pain, numbness, and tingling. Sciatic nerve glide 5. Sit in a chair with your head facing down toward your chest. Place your hands behind your back. Let your shoulders slump forward. 6. Slowly straighten one of your legs while you tilt your head back as if you are looking toward the ceiling. Only straighten your leg as far as you can without making your symptoms worse. 7. Hold this position for __________ seconds. 8. Slowly return to the starting position. 9. Repeat with your other leg. Repeat __________ times. Complete this exercise __________ times a day. Knee to chest with hip adduction and internal rotation  1. Lie on your back on a firm surface with both legs straight. 2. Bend one of your knees and move it up toward your chest until you feel a gentle stretch in your lower back and buttock. Then, move your knee toward the shoulder that is on the opposite side from your leg. This is hip adduction and internal rotation. ? Hold your leg in this position by holding on to the front of your knee. 3. Hold this position for __________ seconds. 4. Slowly return to the starting position. 5. Repeat with your other leg. Repeat __________ times.  Complete this exercise __________ times a day. Prone extension on elbows  1. Lie on your abdomen on a firm surface. A bed may be too soft for this exercise. 2. Prop yourself up on your elbows. 3. Use your arms to help lift your chest up until you feel a gentle stretch in your abdomen and your lower back. ? This will place some of your body weight on your elbows. If this is uncomfortable, try stacking pillows under your chest. ? Your hips should stay down, against the surface that you are lying on. Keep your hip and back muscles relaxed. 4. Hold this position for __________ seconds. 5. Slowly relax your upper body and return to the starting position. Repeat __________ times. Complete this exercise __________ times a day. Strengthening exercises These exercises build strength and endurance in your back. Endurance is the ability to use your muscles for a long time, even after they get tired. Pelvic tilt This exercise strengthens the muscles that lie deep in the abdomen. 1. Lie on your back on a firm surface. Bend your knees and keep your feet flat on the floor. 2. Tense your  abdominal muscles. Tip your pelvis up toward the ceiling and flatten your lower back into the floor. ? To help with this exercise, you may place a small towel under your lower back and try to push your back into the towel. 3. Hold this position for __________ seconds. 4. Let your muscles relax completely before you repeat this exercise. Repeat __________ times. Complete this exercise __________ times a day. Alternating arm and leg raises  1. Get on your hands and knees on a firm surface. If you are on a hard floor, you may want to use padding, such as an exercise mat, to cushion your knees. 2. Line up your arms and legs. Your hands should be directly below your shoulders, and your knees should be directly below your hips. 3. Lift your left leg behind you. At the same time, raise your right arm and straighten it in front of  you. ? Do not lift your leg higher than your hip. ? Do not lift your arm higher than your shoulder. ? Keep your abdominal and back muscles tight. ? Keep your hips facing the ground. ? Do not arch your back. ? Keep your balance carefully, and do not hold your breath. 4. Hold this position for __________ seconds. 5. Slowly return to the starting position. 6. Repeat with your right leg and your left arm. Repeat __________ times. Complete this exercise __________ times a day. Posture and body mechanics Good posture and healthy body mechanics can help to relieve stress in your body's tissues and joints. Body mechanics refers to the movements and positions of your body while you do your daily activities. Posture is part of body mechanics. Good posture means:  Your spine is in its natural S-curve position (neutral).  Your shoulders are pulled back slightly.  Your head is not tipped forward. Follow these guidelines to improve your posture and body mechanics in your everyday activities. Standing   When standing, keep your spine neutral and your feet about hip width apart. Keep a slight bend in your knees. Your ears, shoulders, and hips should line up.  When you do a task in which you stand in one place for a long time, place one foot up on a stable object that is 2-4 inches (5-10 cm) high, such as a footstool. This helps keep your spine neutral. Sitting   When sitting, keep your spine neutral and keep your feet flat on the floor. Use a footrest, if necessary, and keep your thighs parallel to the floor. Avoid rounding your shoulders, and avoid tilting your head forward.  When working at a desk or a computer, keep your desk at a height where your hands are slightly lower than your elbows. Slide your chair under your desk so you are close enough to maintain good posture.  When working at a computer, place your monitor at a height where you are looking straight ahead and you do not have to tilt  your head forward or downward to look at the screen. Resting  When lying down and resting, avoid positions that are most painful for you.  If you have pain with activities such as sitting, bending, stooping, or squatting, lie in a position in which your body does not bend very much. For example, avoid curling up on your side with your arms and knees near your chest (fetal position).  If you have pain with activities such as standing for a long time or reaching with your arms, lie with your spine in a  neutral position and bend your knees slightly. Try the following positions: ? Lying on your side with a pillow between your knees. ? Lying on your back with a pillow under your knees. Lifting   When lifting objects, keep your feet at least shoulder width apart and tighten your abdominal muscles.  Bend your knees and hips and keep your spine neutral. It is important to lift using the strength of your legs, not your back. Do not lock your knees straight out.  Always ask for help to lift heavy or awkward objects. This information is not intended to replace advice given to you by your health care provider. Make sure you discuss any questions you have with your health care provider. Document Released: 09/09/2005 Document Revised: 01/01/2019 Document Reviewed: 10/01/2018 [employer] Patient Education  2020 [employer]

## 2019-05-18 NOTE — Assessment & Plan Note (Signed)
HTN for > 5 years, normally well controlled with norvasc, today elevated, but likely due to 8/10 pain She's compliant with meds, last labs showed excellent renal function Con't norvasc, continue to monitor

## 2019-05-18 NOTE — Assessment & Plan Note (Signed)
Per CT in 2018, not on statin Will need statin with dx FLP today No CAD per cardiology in 2018

## 2019-05-18 NOTE — Assessment & Plan Note (Signed)
Recheck labs, will need statin Also tx with diet

## 2019-05-18 NOTE — Progress Notes (Signed)
Patient ID: Weston AnnaJanice M Barba, female    DOB: Jan 31, 1961, 58 y.o.   MRN: [id number]  [facility]  Chief Complaint  Patient presents with  . Leg Pain    left leg aching for 1 mont, pain radiates from lower back down leg    Subjective:   Weston AnnaJanice M Imparato is a 58 y.o. female, presents to clinic with CC of the following:  Left leg pain onset over a month ago, the whole legs aches like a tooth ache, started gradually, had been intermittent, but over the past month gradually worsening and is more constant and severe-  starting to concern her.  She is worried about a blood clot since a family member had a DVT that went to lungs.  She has hx of some past similar RIGHT leg pain, it was on and off, associated with low back pain.  The right leg pain eased up and left leg pain started, she has not seen anyone for either.  No past xrays, no past back injury and no known osteoporosis/penia or DDD.  She reports that she has had no back pain associated with her constant and severe leg pain but as she sitting the exam when she presses on her low back she does endorse a little bit of left low back and buttocks pain.  Currently her left leg pain rated 8/10, deep "tooth ache" feeling to all muscles to entire leg.  No improvement with resting and elevating, has rubbed aspercream and taken ibupuprofen.  Pain is exacerbated with any use walking, standing), patient states that it hurts with ambulation but there is no worsening pain with longer distances of walking.  She denies any associated numbness, tingling, skin color changes, sores, wounds, pallor, vein changes, swelling.    HTN - pt on norvasc for BP, no SE, last visit was virtual about 3 months ago, BP high today, but pt is in pain.  Last labs 01/2019 with good renal function.  She denies LE edema, palpitations, SOB, CP, HA, visual disturbances  HLD - per chart, not on meds, she is unsure of dx or past treatment.  I reviewed cardiac testing  from 2018 - asked pt about her cardiac work up, but she also could not recall much from that.  But per chart review with a CT chest lung cancer screening exam from January 2018 there was evidence of aortic atherosclerosis -diagnosis added to chart.   Patient had chest pain also in 2018 and cardiac work-up done in3/2018 NM stress test was positive with 01/02/2019 shows no CAD on Cardiac/Coronary  CT  Hypothyroid- Last labs TSH high, and before that med changes not tolerated, she said it put her in the hospital with CP/racing heart.  Her dose of levothyroxine is 50 mcg Sun, Tu, Th, Sat, and 75 mcg M, W, F.  Has been on this dosing for "years" and she doesn't want to change it.  Also last labs she said cost her several hundred's of dollars which she cannot afford.    Patient Active Problem List   Diagnosis Date Noted  . Aortic atherosclerosis (HCC) 05/18/2019  . Hx of tobacco use, presenting hazards to health 07/03/2016  . Early menopause 07/03/2016  . Hyperlipidemia 12/27/2015  . Essential hypertension   . COPD (chronic obstructive pulmonary disease) (HCC)   . Hypothyroidism   . Vitamin D deficiency disease       Current Outpatient Medications:  .  amLODipine (NORVASC) 5 MG tablet, Take  1 tablet (5 mg total) by mouth daily., Disp: 90 tablet, Rfl: 3 .  aspirin EC 81 MG tablet, Take 81 mg by mouth daily., Disp: , Rfl:  .  famotidine (PEPCID) 20 MG tablet, Take 1 tablet (20 mg total) by mouth daily as needed for heartburn or indigestion., Disp: 90 tablet, Rfl: 3 .  levothyroxine (SYNTHROID) 50 MCG tablet, TAKE ONE AND ONE-HALF TABLET BY MOUTH ON MONDAY, WEDNESDAY, AND FRIDAY. TAKE ONE TABLET ON SUNDAY, TUESDAY, THURSDAY, AND SATURDAY, Disp: 117 tablet, Rfl: 3 .  naproxen (NAPROSYN) 500 MG tablet, Take 1 tablet (500 mg total) by mouth 2 (two) times daily as needed for moderate pain., Disp: 30 tablet, Rfl: 0 .  predniSONE (DELTASONE) 20 MG tablet, Take 2 tablets (40 mg total) by mouth daily with  breakfast for 5 days., Disp: 10 tablet, Rfl: 0   Allergies  Allergen Reactions  . Codeine   . Penicillins      Family History  Problem Relation Age of Onset  . COPD Mother   . Hypertension Mother   . Pulmonary embolism Father   . Diabetes Father   . COPD Father   . Hypertension Brother   . Stroke Maternal Grandmother   . Cancer Neg Hx   . Heart disease Neg Hx      Social History   Socioeconomic History  . Marital status: Single    Spouse name: Not on file  . Number of children: Not on file  . Years of education: Not on file  . Highest education level: Not on file  Occupational History  . Not on file  Social Needs  . Financial resource strain: Not on file  . Food insecurity    Worry: Not on file    Inability: Not on file  . Transportation needs    Medical: Not on file    Non-medical: Not on file  Tobacco Use  . Smoking status: Former Smoker    Packs/day: 1.50    Years: 30.00    Pack years: 45.00    Types: Cigarettes    Quit date: 09/24/2011    Years since quitting: 7.6  . Smokeless tobacco: Never Used  Substance and Sexual Activity  . Alcohol use: No  . Drug use: No  . Sexual activity: Not Currently    Birth control/protection: Post-menopausal  Lifestyle  . Physical activity    Days per week: Not on file    Minutes per session: Not on file  . Stress: Not on file  Relationships  . Social Musician on phone: Not on file    Gets together: Not on file    Attends religious service: Not on file    Active member of club or organization: Not on file    Attends meetings of clubs or organizations: Not on file    Relationship status: Not on file  . Intimate partner violence    Fear of current or ex partner: Not on file    Emotionally abused: Not on file    Physically abused: Not on file    Forced sexual activity: Not on file  Other Topics Concern  . Not on file  Social History Narrative  . Not on file    I personally reviewed active problem  list, medication list, allergies, family history, social history, health maintenance, notes from last encounter, lab results with the patient/caregiver today.  Review of Systems  Constitutional: Negative.  Negative for activity change, appetite change, chills, fatigue and  unexpected weight change.  HENT: Negative.   Eyes: Negative.   Respiratory: Negative.  Negative for cough, chest tightness, shortness of breath and wheezing.   Cardiovascular: Negative.  Negative for chest pain, palpitations and leg swelling.  Gastrointestinal: Negative.  Negative for abdominal pain, blood in stool, diarrhea and vomiting.  Endocrine: Negative.   Genitourinary: Negative.   Musculoskeletal: Positive for back pain and myalgias. Negative for arthralgias, gait problem and joint swelling.  Skin: Negative.  Negative for color change, pallor and rash.  Allergic/Immunologic: Negative.   Neurological: Negative.  Negative for dizziness, tremors, syncope, weakness, light-headedness, numbness and headaches.  Hematological: Negative.  Negative for adenopathy. Does not bruise/bleed easily.  Psychiatric/Behavioral: Negative.  Negative for confusion, dysphoric mood, self-injury and suicidal ideas.  All other systems reviewed and are negative.      Objective:   Vitals:   05/18/19 1032  BP: (!) 146/82  Pulse: 83  Resp: 14  Temp: (!) 97.5 F (36.4 C)  TempSrc: Oral  SpO2: 94%  Weight: 137 lb 6.4 oz (62.3 kg)  Height: 5\' 1"  (1.549 m)    Body mass index is 25.96 kg/m.  Physical Exam Vitals signs and nursing note reviewed.  Constitutional:      General: She is not in acute distress.    Appearance: Normal appearance. She is well-developed. She is not toxic-appearing or diaphoretic.     Comments: Slightly anxious female, NAD, appears older than stated age  HENT:     Head: Normocephalic and atraumatic.     Right Ear: External ear normal.     Left Ear: External ear normal.     Mouth/Throat:     Mouth: Mucous  membranes are moist.     Pharynx: Uvula midline.  Eyes:     General: Lids are normal. No scleral icterus.    Conjunctiva/sclera: Conjunctivae normal.     Pupils: Pupils are equal, round, and reactive to light.  Neck:     Musculoskeletal: Normal range of motion and neck supple.     Thyroid: No thyromegaly.     Trachea: Phonation normal. No tracheal deviation.  Cardiovascular:     Rate and Rhythm: Normal rate and regular rhythm.  No extrasystoles are present.    Chest Wall: PMI is not displaced.     Pulses: Normal pulses.          Radial pulses are 2+ on the right side and 2+ on the left side.       Dorsalis pedis pulses are 2+ on the right side and 2+ on the left side.       Posterior tibial pulses are 2+ on the right side and 2+ on the left side.     Heart sounds: Normal heart sounds. Heart sounds not distant. No murmur. No friction rub. No gallop.      Comments: No LE pallor, edema, erythema, varicosities, warm to the touch b/l Pulmonary:     Effort: Pulmonary effort is normal. No bradypnea or respiratory distress.     Breath sounds: Normal breath sounds. No stridor, decreased air movement or transmitted upper airway sounds. No decreased breath sounds, wheezing, rhonchi or rales.  Chest:     Chest wall: No tenderness.  Abdominal:     General: Bowel sounds are normal. There is no distension.     Palpations: Abdomen is soft.     Tenderness: There is no abdominal tenderness. There is no guarding or rebound.  Musculoskeletal: Normal range of motion.  General: No deformity.     Right lower leg: No edema.     Left lower leg: No edema.     Comments: No midline tenderness from cervical to lumbar spine + SLR on the left Left SI joint area ttp No paraspinal muscle tenderness from cervical to lumbar spine 5/5 strength dorsiflexion and plantarflexion b/l and 5/5 strength with flexion and extension at the knees b/l Grossly normal sensation to light touch in b/l LE No LE edema, pallor,  extremities warm to the touch, good pulses in extremities Normal aROM of left hip, back, bilateral knees Mildly antalgic gait, will not put full weight on left leg, but able to get from chair to stand to up on exam table easily - places weight on right leg for positional transitions All muscle compartments of left leg (calf, hamstrings, quad) soft  Lymphadenopathy:     Cervical: No cervical adenopathy.  Skin:    General: Skin is warm and dry.     Capillary Refill: Capillary refill takes less than 2 seconds.     Coloration: Skin is not ashen, cyanotic, jaundiced, mottled, pale or sallow.     Findings: No bruising or rash.     Nails: There is no clubbing.   Neurological:     Mental Status: She is alert and oriented to person, place, and time.     Motor: No abnormal muscle tone.     Gait: Gait normal.  Psychiatric:        Attention and Perception: Attention normal.        Mood and Affect: Affect normal. Mood is anxious.        Speech: Speech normal.        Behavior: Behavior normal. Behavior is cooperative.      Results for orders placed or performed during the hospital encounter of 02/05/19  Comprehensive Metabolic Panel (CMET)  Result Value Ref Range   Sodium 137 135 - 145 mmol/L   Potassium 3.9 3.5 - 5.1 mmol/L   Chloride 103 98 - 111 mmol/L   CO2 27 22 - 32 mmol/L   Glucose, Bld 99 70 - 99 mg/dL   BUN 12 6 - 20 mg/dL   Creatinine, Ser 1.610.83 0.44 - 1.00 mg/dL   Calcium 9.1 8.9 - 09.610.3 mg/dL   Total Protein 7.6 6.5 - 8.1 g/dL   Albumin 4.4 3.5 - 5.0 g/dL   AST 21 15 - 41 U/L   ALT 12 0 - 44 U/L   Alkaline Phosphatase 77 38 - 126 U/L   Total Bilirubin 0.4 0.3 - 1.2 mg/dL   GFR calc non Af Amer >60 >60 mL/min   GFR calc Af Amer >60 >60 mL/min   Anion gap 7 5 - 15  Lipid Profile  Result Value Ref Range   Cholesterol 216 (H) 0 - 200 mg/dL   Triglycerides 045121 <409<150 mg/dL   HDL 93 >81>40 mg/dL   Total CHOL/HDL Ratio 2.3 RATIO   VLDL 24 0 - 40 mg/dL   LDL Cholesterol 99 0 - 99  mg/dL  TSH  Result Value Ref Range   TSH 5.645 (H) 0.350 - 4.500 uIU/mL        Assessment & Plan:   Acute Complaints today:   ICD-10-CM   1. Left leg pain  M79.605 naproxen (NAPROSYN) 500 MG tablet   suspected secondary to lumbarsacral radiculopathy/sciatica, also in differential PAD, myopathy?  does not seem to have claudication- hurts all the time and more with any use of  leg, she does notice worsening pain with longer distances and because of severe pain has been limiting activity a lot, no signs today of acute ischemia - good color, warmth, pulses of LLE, muscle compartments soft, plan is to treat possible sciatic with steroid burst, stretches and monitor closely Advised that PT would be helpful, xrays to eval SI joint, hip joint, and lumbar spine looking for DDD/arthritis.  Pt agrees to xrays, refuses PT right now.  2. Acute left-sided low back pain with left-sided sciatica  M54.42 predniSONE (DELTASONE) 20 MG tablet    DG Lumbar Spine Complete    DG Hip Unilat W OR W/O Pelvis 2-3 Views Left    naproxen (NAPROSYN) 500 MG tablet   tx for possible radiculopathy with steroid burst followed by NSAIDs, pt refuses Pt due cost and difficultly with transportation, stretches given in handout  3. Myalgia  D53.29 COMPLETE METABOLIC PANEL WITH GFR    Magnesium    CK (Creatine Kinase)    TSH    Magnesium    CK (Creatine Kinase)    Comprehensive Metabolic Panel (CMET)    naproxen (NAPROSYN) 500 MG tablet   check CK mag, electrolytes, renal function, muscle compartments soft   Problem List Items Addressed This Visit      Cardiovascular and Mediastinum   Essential hypertension    HTN for > 5 years, normally well controlled with norvasc, today elevated, but likely due to 8/10 pain She's compliant with meds, last labs showed excellent renal function Con't norvasc, continue to monitor      Relevant Medications   amLODipine (NORVASC) 5 MG tablet   Aortic atherosclerosis ([place])    Per CT in  2018, not on statin Will need statin with dx FLP today No CAD per cardiology in 2018 With aortic atherosclerosis low threshold to do further eval for PAD if LE pain does not improve (discussed briefly ABIs)      Relevant Medications   amLODipine (NORVASC) 5 MG tablet     Respiratory   COPD (chronic obstructive pulmonary disease) ([place])    Not currently on meds for COPD, no current sx, but added to visit while reviewing chart and updating problem list Per CT 2018 - "Mild diffuse bronchial wall thickening with mild centrilobular and paraseptal emphysema; imaging findings suggestive of underlying COPD" 45 pack year hx smoking, Quit smoking 2013  No daily sx, no exertional sx, though angina sx in the past may be pulmonary?      Relevant Medications   predniSONE (DELTASONE) 20 MG tablet     Digestive   Gastroesophageal reflux disease with esophagitis Well controlled with PRN pepcid, not using every day   Relevant Medications   famotidine (PEPCID) 20 MG tablet     Endocrine   Hypothyroidism Last TSH elevated, dose increased caused severe SE, does not tolerate any dose med adjustments, will recheck TSH   Relevant Orders   TSH   TSH     Other   Vitamin D deficiency disease    Supplemented in the past with Rx, but stopped, never did OTC supplements Advised 1000 IU Vit D3 BID with calcium Will not obtain labs due to financial concerns      Hyperlipidemia - hx in chart - will recheck FLP - will need statin tx    Relevant Medications   amLODipine (NORVASC) 5 MG tablet   Other Relevant Orders   Lipid panel   COMPLETE METABOLIC PANEL WITH GFR   Lipid panel   Comprehensive Metabolic  Panel (CMET)   Hx of tobacco use, presenting hazards to health    45 pack year hx, quit smoking 7 years ago Annual low dose CT chest screening until 2028 Will order today, continues to have no sx concerning for lung cancer, no SOB, cough, weight loss, CP Discussion of screening thoroughly address  before and briefly reviewed today.        Other Visit Diagnoses    Abnormal TSH    - last labs   follow up TSH after dose changes and then SE, last TSH elevated   Relevant Orders   TSH         (Labs put in twice today due to pt preference - she previously received high bills after lab services, has limited financial resources and [facility] done)  Danelle BerryLeisa Zurii Hewes, PA-C 05/18/19 1:28 PM

## 2019-05-18 NOTE — Assessment & Plan Note (Signed)
Well controlled with PRN use of pepcid

## 2019-05-18 NOTE — Assessment & Plan Note (Signed)
Last TSH high Sensitive to med changes Recheck labs

## 2019-05-18 NOTE — Assessment & Plan Note (Signed)
45 pack year hx, quit smoking 7 years ago Annual low dose CT chest screening until 2028 Will order today to continue to monitor

## 2019-05-18 NOTE — Assessment & Plan Note (Addendum)
Supplemented in the past with Rx, but stopped, never did OTC supplements Do high dose Rx again followed by OTC Vit D3 Advised to always do calcium + Vit D, unless directed to hold calium Advised 1000 IU Vit D3 BID when using over the counter And 1200 mg calcium daily

## 2019-05-18 NOTE — Assessment & Plan Note (Addendum)
Per CT 2018 - "Mild diffuse bronchial wall thickening with mild centrilobular and paraseptal emphysema; imaging findings suggestive of underlying COPD" 45 pack year hx smoking, Quit smoking 2013  No daily sx, no exertional sx, though angina sx in the past may be pulmonary?

## 2019-05-19 ENCOUNTER — Encounter: Payer: Self-pay | Admitting: Family Medicine

## 2019-05-19 MED ORDER — ROSUVASTATIN CALCIUM 20 MG PO TABS: 20.0000 mg | ORAL_TABLET | Freq: Every day | ORAL | 1 refills | Status: DC

## 2019-05-19 MED ORDER — VITAMIN D (ERGOCALCIFEROL) 1.25 MG (50000 UNIT) PO CAPS: 50000.0000 [IU] | ORAL_CAPSULE | ORAL | 0 refills | Status: DC

## 2019-05-20 ENCOUNTER — Other Ambulatory Visit: Payer: Self-pay | Admitting: Family Medicine

## 2019-05-20 DIAGNOSIS — M79605 Pain in left leg: Secondary | ICD-10-CM

## 2019-05-20 DIAGNOSIS — I70209 Unspecified atherosclerosis of native arteries of extremities, unspecified extremity: Secondary | ICD-10-CM

## 2019-05-20 DIAGNOSIS — E782 Mixed hyperlipidemia: Secondary | ICD-10-CM

## 2019-05-20 NOTE — Progress Notes (Signed)
ABI's ordered - pt anxious about the test (see nurse note from yesterday)  I discussed this with the nurse and did request that she explains that it simply getting blood pressure in her arms and her legs.  I have worked with the referral coordinator here on selecting correct testing we have ordered it and she is going to be checking for insurance approval and will be working on communicating that to me and to the patient.  I am ordering ABIs to rule out PAD as one potential cause of her leg pain.  Clinically she does not appear to have any severe arterial occlusive disease in her left leg but because of her history of hyperlipidemia and because x-rays showed aortoiliac calcifications I want to make sure we gather some data like ABIs.  Patient will not go to a specialist due to financial constraints and concerns about insurance so I want to make sure I have ABI info and legitimate reasons to get her to a specialist if needed.  I do think the leg pain may be sciatic sx.   And pt is willing to start tx for hyperlipidemia and atherosclerotic disease, starting crestor 20 mg with 3 month f/up    ICD-10-CM   1. 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 Mixed hyperlipidemia  E78.2 US ARTERIAL ABI (SCREENING LOWER EXTREMITY)    CANCELED: PCV ANKLE BRACHIAL INDEX (ABI)  3. Left leg pain  M79.605 US ARTERIAL ABI (SCREENING LOWER EXTREMITY)    CANCELED: PCV ANKLE BRACHIAL INDEX (ABI)   Delsa Grana, PA-C 05/20/19 9:49 AM

## 2019-05-27 ENCOUNTER — Ambulatory Visit: Payer: BLUE CROSS/BLUE SHIELD

## 2019-05-28 ENCOUNTER — Ambulatory Visit
Admission: RE | Admit: 2019-05-28 | Discharge: 2019-05-28 | Disposition: A | Discharge status: Home / Self Care | Payer: BLUE CROSS/BLUE SHIELD | Source: Ambulatory Visit | Attending: Family Medicine | Admitting: Family Medicine

## 2019-05-28 ENCOUNTER — Other Ambulatory Visit: Payer: Self-pay

## 2019-05-28 DIAGNOSIS — I70209 Unspecified atherosclerosis of native arteries of extremities, unspecified extremity: Secondary | ICD-10-CM | POA: Diagnosis present

## 2019-05-28 DIAGNOSIS — M79605 Pain in left leg: Secondary | ICD-10-CM | POA: Insufficient documentation

## 2019-05-28 DIAGNOSIS — E782 Mixed hyperlipidemia: Secondary | ICD-10-CM | POA: Diagnosis present

## 2019-07-20 ENCOUNTER — Other Ambulatory Visit: Payer: Self-pay

## 2019-07-20 DIAGNOSIS — Z20822 Contact with and (suspected) exposure to covid-19: Secondary | ICD-10-CM

## 2019-07-21 ENCOUNTER — Ambulatory Visit (INDEPENDENT_AMBULATORY_CARE_PROVIDER_SITE_OTHER): Payer: BLUE CROSS/BLUE SHIELD | Admitting: Family Medicine

## 2019-07-21 ENCOUNTER — Other Ambulatory Visit: Payer: Self-pay

## 2019-07-21 ENCOUNTER — Encounter: Payer: Self-pay | Admitting: Family Medicine

## 2019-07-21 DIAGNOSIS — J441 Chronic obstructive pulmonary disease with (acute) exacerbation: Secondary | ICD-10-CM | POA: Diagnosis not present

## 2019-07-21 DIAGNOSIS — Z20828 Contact with and (suspected) exposure to other viral communicable diseases: Secondary | ICD-10-CM

## 2019-07-21 DIAGNOSIS — Z20822 Contact with and (suspected) exposure to covid-19: Secondary | ICD-10-CM

## 2019-07-21 LAB — NOVEL CORONAVIRUS, NAA: SARS-CoV-2, NAA: NOT DETECTED

## 2019-07-21 MED ORDER — ALBUTEROL SULFATE (2.5 MG/3ML) 0.083% IN NEBU: 2.5000 mg | INHALATION_SOLUTION | Freq: Four times a day (QID) | RESPIRATORY_TRACT | 1 refills | Status: DC | PRN

## 2019-07-21 MED ORDER — AZITHROMYCIN 250 MG PO TABS: ORAL_TABLET | ORAL | 0 refills | Status: DC

## 2019-07-21 MED ORDER — PREDNISONE 10 MG PO TABS: ORAL_TABLET | ORAL | 0 refills | Status: DC

## 2019-07-21 MED ORDER — BENZONATATE 100 MG PO CAPS: 100.0000 mg | ORAL_CAPSULE | Freq: Three times a day (TID) | ORAL | 0 refills | Status: DC | PRN

## 2019-07-21 NOTE — Progress Notes (Signed)
Name: Stephanie Howe   MRN: [id number]    DOB: September 22, 1961   Date:07/21/2019       Progress Note  Subjective  Chief Complaint  Chief Complaint  Patient presents with  . URI    cough, hurting to breath, brother positive for Covid    I connected with  Weston Anna on 07/21/19 at 10:40 AM EDT by telephone and verified that I am speaking with the correct person using two identifiers.   I discussed the limitations, risks, security and privacy concerns of performing an evaluation and management service by telephone and the availability of in person appointments. Staff also discussed with the patient that there may be a patient responsible charge related to this service. Patient Location: Home Provider Location: Home Office  Additional Individuals present: None  HPI  Pt presents with concern for URI - she has had symptoms for about 10 days She was also around her brother 7 days ago (after her symptoms started), and later found out that her Brother tested positive for COVID-19.  She did go for testing yesterday and results are pending.  She has history of similar illness.  She does have COPD and has had similar flares around this time of year before.  She she did quit smoking about 5-6 years ago.  Does have some chest tightness, cough, and some shortness of breath.  Denies fevers, chills, taste/smell changes, nausea, vomiting, diarrhea, body aches  Patient Active Problem List   Diagnosis Date Noted  . Aortic atherosclerosis (HCC) 05/18/2019  . Gastroesophageal reflux disease with esophagitis 05/18/2019  . Hx of tobacco use, presenting hazards to health 07/03/2016  . Early menopause 07/03/2016  . Hyperlipidemia 12/27/2015  . Essential hypertension   . COPD (chronic obstructive pulmonary disease) (HCC)   . Hypothyroidism   . Vitamin D deficiency disease     Social History   Tobacco Use  . Smoking status: Former Smoker    Packs/day: 1.50    Years: 30.00    Pack years: 45.00   Types: Cigarettes    Quit date: 09/24/2011    Years since quitting: 7.8  . Smokeless tobacco: Never Used  Substance Use Topics  . Alcohol use: No     Current Outpatient Medications:  .  amLODipine (NORVASC) 5 MG tablet, Take 1 tablet (5 mg total) by mouth daily., Disp: 90 tablet, Rfl: 3 .  aspirin EC 81 MG tablet, Take 81 mg by mouth daily., Disp: , Rfl:  .  famotidine (PEPCID) 20 MG tablet, Take 1 tablet (20 mg total) by mouth daily as needed for heartburn or indigestion., Disp: 90 tablet, Rfl: 3 .  levothyroxine (SYNTHROID) 50 MCG tablet, TAKE ONE AND ONE-HALF TABLET BY MOUTH ON MONDAY, WEDNESDAY, AND FRIDAY. TAKE ONE TABLET ON SUNDAY, TUESDAY, THURSDAY, AND SATURDAY, Disp: 117 tablet, Rfl: 3 .  Vitamin D, Ergocalciferol, (DRISDOL) 1.25 MG (50000 UT) CAPS capsule, Take 1 capsule (50,000 Units total) by mouth every 7 (seven) days. x12 weeks., Disp: 12 capsule, Rfl: 0 .  naproxen (NAPROSYN) 500 MG tablet, Take 1 tablet (500 mg total) by mouth 2 (two) times daily as needed for moderate pain. (Patient not taking: Reported on 07/21/2019), Disp: 30 tablet, Rfl: 0 .  rosuvastatin (CRESTOR) 20 MG tablet, Take 1 tablet (20 mg total) by mouth at bedtime. (Patient not taking: Reported on 07/21/2019), Disp: 90 tablet, Rfl: 1  Allergies  Allergen Reactions  . Codeine   . Penicillins     I personally reviewed active  problem list, medication list, allergies, notes from last encounter, lab results with the patient/caregiver today.  ROS  Ten systems reviewed and is negative except as mentioned in HPI  Objective  Virtual encounter, vitals not obtained.  There is no height or weight on file to calculate BMI.  Nursing Note and Vital Signs reviewed.  Physical Exam  Pulmonary/Chest: Effort normal. No respiratory distress. Speaking in complete sentences; dry cough present intermittently. Neurological: Pt is alert and oriented to person, place, and time. Coordination, speech and gait are normal.   Psychiatric: Patient has a normal mood and affect. behavior is normal. Judgment and thought content normal.  No results found for this or any previous visit (from the past 72 hour(s)).  Assessment & Plan  1. Chronic obstructive pulmonary disease with acute exacerbation (HCC) - azithromycin (ZITHROMAX) 250 MG tablet; Day 1: take 2 tablets; Days 2-5: Take 1 tablet once daily  Dispense: 6 tablet; Refill: 0 - predniSONE (DELTASONE) 10 MG tablet; Day1:5tabs; Day2:4tabs, Day3:3tabs; Day 4:2tabs; Day 5:1 tab  Dispense: 15 tablet; Refill: 0 - albuterol (PROVENTIL) (2.5 MG/3ML) 0.083% nebulizer solution; Take 3 mLs (2.5 mg total) by nebulization every 6 (six) hours as needed for wheezing or shortness of breath.  Dispense: 150 mL; Refill: 1 - benzonatate (TESSALON PERLES) 100 MG capsule; Take 1 capsule (100 mg total) by mouth 3 (three) times daily as needed.  Dispense: 20 capsule; Refill: 0  2. Close exposure to COVID-19 virus - Awaiting results; advised strict quarantine until results are returned.  -Red flags and when to present for emergency care or RTC including fever >101.45F, chest pain, shortness of breath, new/worsening/un-resolving symptoms, reviewed with patient at time of visit. Follow up and care instructions discussed and provided in AVS. - I discussed the assessment and treatment plan with the patient. The patient was provided an opportunity to ask questions and all were answered. The patient agreed with the plan and demonstrated an understanding of the instructions.  - The patient was advised to call back or seek an in-person evaluation if the symptoms worsen or if the condition fails to improve as anticipated.  I provided 16 minutes of non-face-to-face time during this encounter.  Hubbard Hartshorn, FNP

## 2020-02-25 ENCOUNTER — Ambulatory Visit (INDEPENDENT_AMBULATORY_CARE_PROVIDER_SITE_OTHER): Payer: Self-pay | Admitting: Family Medicine

## 2020-02-25 ENCOUNTER — Other Ambulatory Visit: Payer: Self-pay

## 2020-02-25 ENCOUNTER — Other Ambulatory Visit: Payer: Self-pay | Admitting: Emergency Medicine

## 2020-02-25 ENCOUNTER — Telehealth: Payer: BLUE CROSS/BLUE SHIELD | Admitting: Family Medicine

## 2020-02-25 ENCOUNTER — Encounter: Payer: Self-pay | Admitting: Family Medicine

## 2020-02-25 VITALS — Temp 99.0°F

## 2020-02-25 DIAGNOSIS — E782 Mixed hyperlipidemia: Secondary | ICD-10-CM

## 2020-02-25 DIAGNOSIS — E559 Vitamin D deficiency, unspecified: Secondary | ICD-10-CM

## 2020-02-25 DIAGNOSIS — I739 Peripheral vascular disease, unspecified: Secondary | ICD-10-CM

## 2020-02-25 DIAGNOSIS — Z1211 Encounter for screening for malignant neoplasm of colon: Secondary | ICD-10-CM

## 2020-02-25 DIAGNOSIS — I700 Atherosclerosis of aorta: Secondary | ICD-10-CM

## 2020-02-25 DIAGNOSIS — R197 Diarrhea, unspecified: Secondary | ICD-10-CM

## 2020-02-25 DIAGNOSIS — E039 Hypothyroidism, unspecified: Secondary | ICD-10-CM

## 2020-02-25 DIAGNOSIS — J410 Simple chronic bronchitis: Secondary | ICD-10-CM

## 2020-02-25 DIAGNOSIS — I10 Essential (primary) hypertension: Secondary | ICD-10-CM

## 2020-02-25 DIAGNOSIS — K2100 Gastro-esophageal reflux disease with esophagitis, without bleeding: Secondary | ICD-10-CM

## 2020-02-25 MED ORDER — ROSUVASTATIN CALCIUM 20 MG PO TABS: 20.0000 mg | ORAL_TABLET | Freq: Every day | ORAL | 1 refills | Status: DC

## 2020-02-25 MED ORDER — VITAMIN D (ERGOCALCIFEROL) 1.25 MG (50000 UNIT) PO CAPS: 50000.0000 [IU] | ORAL_CAPSULE | ORAL | 1 refills | Status: DC

## 2020-02-25 MED ORDER — AMLODIPINE BESYLATE 5 MG PO TABS: 5.0000 mg | ORAL_TABLET | Freq: Every day | ORAL | 1 refills | Status: DC

## 2020-02-25 MED ORDER — LEVOTHYROXINE SODIUM 50 MCG PO TABS: ORAL_TABLET | ORAL | 1 refills | Status: DC

## 2020-02-25 NOTE — Progress Notes (Signed)
Name: Stephanie Howe   MRN: [id number]    DOB: 09-Feb-1961   Date:02/25/2020       Progress Note  Subjective  Chief Complaint  Chief Complaint  Patient presents with  . Diarrhea    on and off for 2 weeks    I connected with  Weston Anna on 02/25/20 at  1:40 PM EDT by telephone and verified that I am speaking with the correct person using two identifiers.   I discussed the limitations, risks, security and privacy concerns of performing an evaluation and management service by telephone and the availability of in person appointments. Staff also discussed with the patient that there may be a patient responsible charge related to this service. Patient Location: at home  Provider Location: [facility]   HPI  PAD: she was having LLextremity pain with activity last year, ABI was normal on left, but positive for PAD on right lower extremity. She states no longer having problems. Discussed importance of going back on Crestor and continue aspirin   [place] ABI done 04/2019  1. Normal resting left ankle-brachial index. 2. Minimally abnormal resting right ankle-brachial index consistent with borderline peripheral arterial disease.  Diarrhea: she states symptoms started about 2 weeks ago with lower abdominal pain, described as cramping and urgency to have a bowel movement, initially about 4-5 times a day, now is about 3 times a day. Initially was formed than more watery and now has more shape again. No blood, mucus on stools. She states normal appetite, no weight loss. Able to stay hydrated. She tried Imodium one day and it helped. She lives alone, but unaware of sick contacts. No nausea or vomiting. No recent traveling.   Hypothyroidism: she is taking levothyroxine as prescribed, she states she has chronic dry skin, no hair loss, having diarrhea but likely from viral illness  COPD: she quit smoking, she only uses albuterol prn, she daily productive cough in am's usually white sputum, no sob or wheezing.     HTN: she is taking medication, denies side effects of medication, no palpitation or chest pain   Dyslipidemia: she stopped Crestor, she did not know what it was for, but willing to resume it now. Discussed [employer], she does not have insurance   Atherosclerosis of aorta: continue aspirin, and resume crestor   Discussed COVID-19 , answered questions   Patient Active Problem List   Diagnosis Date Noted  . Aortic atherosclerosis (HCC) 05/18/2019  . Gastroesophageal reflux disease with esophagitis 05/18/2019  . Hx of tobacco use, presenting hazards to health 07/03/2016  . Early menopause 07/03/2016  . Hyperlipidemia 12/27/2015  . Essential hypertension   . COPD (chronic obstructive pulmonary disease) (HCC)   . Hypothyroidism   . Vitamin D deficiency disease     Social History   Tobacco Use  . Smoking status: Former Smoker    Packs/day: 1.50    Years: 30.00    Pack years: 45.00    Types: Cigarettes    Quit date: 09/24/2011    Years since quitting: 8.4  . Smokeless tobacco: Never Used  Substance Use Topics  . Alcohol use: No     Current Outpatient Medications:  .  albuterol (PROVENTIL) (2.5 MG/3ML) 0.083% nebulizer solution, Take 3 mLs (2.5 mg total) by nebulization every 6 (six) hours as needed for wheezing or shortness of breath., Disp: 150 mL, Rfl: 1 .  amLODipine (NORVASC) 5 MG tablet, Take 1 tablet (5 mg total) by mouth daily., Disp: 90 tablet, Rfl: 3 .  aspirin EC 81 MG tablet, Take 81 mg by mouth daily., Disp: , Rfl:  .  famotidine (PEPCID) 20 MG tablet, Take 1 tablet (20 mg total) by mouth daily as needed for heartburn or indigestion., Disp: 90 tablet, Rfl: 3 .  levothyroxine (SYNTHROID) 50 MCG tablet, TAKE ONE AND ONE-HALF TABLET BY MOUTH ON MONDAY, WEDNESDAY, AND FRIDAY. TAKE ONE TABLET ON SUNDAY, TUESDAY, THURSDAY, AND SATURDAY, Disp: 117 tablet, Rfl: 3 .  rosuvastatin (CRESTOR) 20 MG tablet, Take 1 tablet (20 mg total) by mouth at bedtime. (Patient not taking:  Reported on 07/21/2019), Disp: 90 tablet, Rfl: 1 .  Vitamin D, Ergocalciferol, (DRISDOL) 1.25 MG (50000 UT) CAPS capsule, Take 1 capsule (50,000 Units total) by mouth every 7 (seven) days. x12 weeks. (Patient not taking: Reported on 02/25/2020), Disp: 12 capsule, Rfl: 0  Allergies  Allergen Reactions  . Codeine   . Penicillins     I personally reviewed active problem list, medication list, allergies, family history, social history, health maintenance with the patient/caregiver today.  ROS  Ten systems reviewed and is negative except as mentioned in HPI   Objective  Virtual encounter, vitals not obtained.  There is no height or weight on file to calculate BMI.  Nursing Note and Vital Signs reviewed.  Physical Exam  Awake, alert and oriented  Assessment & Plan  1. Essential hypertension  - amLODipine (NORVASC) 5 MG tablet; Take 1 tablet (5 mg total) by mouth daily.  Dispense: 90 tablet; Refill: 1  2. Simple chronic bronchitis ([place])   3. Aortic atherosclerosis (HCC)  - rosuvastatin (CRESTOR) 20 MG tablet; Take 1 tablet (20 mg total) by mouth at bedtime.  Dispense: 90 tablet; Refill: 1  4. Mixed hyperlipidemia  - rosuvastatin (CRESTOR) 20 MG tablet; Take 1 tablet (20 mg total) by mouth at bedtime.  Dispense: 90 tablet; Refill: 1  5. Acquired hypothyroidism  Last TSH was not at goal, but she is afraid to change it because she felt sick when dose was adjusted in the past  - levothyroxine (SYNTHROID) 50 MCG tablet; TAKE ONE AND ONE-HALF TABLET BY MOUTH ON MONDAY, WEDNESDAY, AND FRIDAY. TAKE ONE TABLET ON SUNDAY, TUESDAY, THURSDAY, AND SATURDAY  Dispense: 180 tablet; Refill: 1 - TSH; Future  6. PAD (peripheral artery disease) (HCC)  - rosuvastatin (CRESTOR) 20 MG tablet; Take 1 tablet (20 mg total) by mouth at bedtime.  Dispense: 90 tablet; Refill: 1  7. Gastroesophageal reflux disease with esophagitis without hemorrhage  takign prn medication  8. Aortic  atherosclerosis (HCC)  - rosuvastatin (CRESTOR) 20 MG tablet; Take 1 tablet (20 mg total) by mouth at bedtime.  Dispense: 90 tablet; Refill: 1  9. Colon cancer screening  - POC Hemoccult Bld/Stl (3-Cd Home Screen); Future  10. Vitamin D deficiency disease  - Vitamin D, Ergocalciferol, (DRISDOL) 1.25 MG (50000 UNIT) CAPS capsule; Take 1 capsule (50,000 Units total) by mouth every 7 (seven) days. x12 weeks.  Dispense: 12 capsule; Refill: 1  11. Diarrhea, unspecified type  Reassurance    -Red flags and when to present for emergency care or RTC including fever >101.72F, chest pain, shortness of breath, new/worsening/un-resolving symptoms, reviewed with patient at time of visit. Follow up and care instructions discussed and provided in AVS. - I discussed the assessment and treatment plan with the patient. The patient was provided an opportunity to ask questions and all were answered. The patient agreed with the plan and demonstrated an understanding of the instructions.  - The patient was advised to  call back or seek an in-person evaluation if the symptoms worsen or if the condition fails to improve as anticipated.  I provided 25  minutes of non-face-to-face time during this encounter.  Ruel Favors, MD

## 2020-02-28 ENCOUNTER — Other Ambulatory Visit: Payer: Self-pay

## 2020-02-28 DIAGNOSIS — K2100 Gastro-esophageal reflux disease with esophagitis, without bleeding: Secondary | ICD-10-CM

## 2020-02-28 MED ORDER — FAMOTIDINE 20 MG PO TABS: 20.0000 mg | ORAL_TABLET | Freq: Every day | ORAL | 1 refills | Status: DC | PRN

## 2020-02-28 NOTE — Telephone Encounter (Signed)
Pharmacy never received RX for Pepcid on 08/25/52020.

## 2020-03-22 ENCOUNTER — Ambulatory Visit: Payer: BLUE CROSS/BLUE SHIELD

## 2020-03-22 ENCOUNTER — Ambulatory Visit
Admission: RE | Admit: 2020-03-22 | Discharge: 2020-03-22 | Disposition: A | Discharge status: Home / Self Care | Payer: Self-pay | Source: Ambulatory Visit | Attending: Oncology | Admitting: Oncology

## 2020-03-22 ENCOUNTER — Other Ambulatory Visit: Payer: Self-pay

## 2020-03-22 ENCOUNTER — Ambulatory Visit: Payer: Self-pay | Attending: Oncology

## 2020-03-22 VITALS — BP 136/59 | HR 47 | Temp 97.9°F | Ht 60.0 in | Wt 135.0 lb

## 2020-03-22 DIAGNOSIS — Z0000 Encounter for general adult medical examination without abnormal findings: Secondary | ICD-10-CM

## 2020-03-22 NOTE — Progress Notes (Signed)
  Subjective:     Patient ID: Weston Anna, female   DOB: [birth year], 59 y.o.   MRN: [id number]  HPI   Review of Systems     Objective:   Physical Exam Chest:     Breasts:        Right: No swelling, bleeding, inverted nipple, mass, nipple discharge, skin change or tenderness.        Left: No swelling, bleeding, inverted nipple, mass, nipple discharge, skin change or tenderness.        Assessment:     59 year old patient presents for [facility] clinic visit.  Patient screened, and meets BCCCP eligibility.  Patient does not have insurance, Medicare or Medicaid. Instructed patient on breast self awareness using teach back method.  Clinical breast exam unremarkable.  No mass or lump palpated. Risk Assessment    Risk Scores      03/22/2020   Last edited by: Alta Corning, CMA   5-year risk: 1.3 %   Lifetime risk: 6.7 %            Plan:     Sent for bilateral screening mammogram.

## 2020-03-24 NOTE — Progress Notes (Unsigned)
Letter mailed from [facility] to notify of normal mammogram results.  Patient to return in one year for annual screening.  Copy to [facility]. 

## 2020-07-04 ENCOUNTER — Ambulatory Visit: Payer: Self-pay | Admitting: Family Medicine

## 2020-07-04 ENCOUNTER — Other Ambulatory Visit: Payer: Self-pay

## 2020-07-04 ENCOUNTER — Encounter: Payer: Self-pay | Admitting: Family Medicine

## 2020-07-04 ENCOUNTER — Other Ambulatory Visit
Admission: RE | Admit: 2020-07-04 | Discharge: 2020-07-04 | Disposition: A | Discharge status: Home / Self Care | Payer: Self-pay | Source: Ambulatory Visit | Attending: Family Medicine | Admitting: Family Medicine

## 2020-07-04 VITALS — BP 134/86 | HR 98 | Temp 98.5°F | Resp 14 | Ht 60.0 in | Wt 133.3 lb

## 2020-07-04 DIAGNOSIS — R7989 Other specified abnormal findings of blood chemistry: Secondary | ICD-10-CM | POA: Insufficient documentation

## 2020-07-04 DIAGNOSIS — E039 Hypothyroidism, unspecified: Secondary | ICD-10-CM | POA: Insufficient documentation

## 2020-07-04 DIAGNOSIS — R202 Paresthesia of skin: Secondary | ICD-10-CM | POA: Insufficient documentation

## 2020-07-04 DIAGNOSIS — M79602 Pain in left arm: Secondary | ICD-10-CM

## 2020-07-04 DIAGNOSIS — M79605 Pain in left leg: Secondary | ICD-10-CM

## 2020-07-04 DIAGNOSIS — M791 Myalgia, unspecified site: Secondary | ICD-10-CM

## 2020-07-04 LAB — CBC WITH DIFFERENTIAL/PLATELET
Abs Immature Granulocytes: 0.04 10*3/uL (ref 0.00–0.07)
Basophils Absolute: 0 10*3/uL (ref 0.0–0.1)
Basophils Relative: 1 %
Eosinophils Absolute: 0.2 10*3/uL (ref 0.0–0.5)
Eosinophils Relative: 2 %
HCT: 40.8 % (ref 36.0–46.0)
Hemoglobin: 13 g/dL (ref 12.0–15.0)
Immature Granulocytes: 1 %
Lymphocytes Relative: 20 %
Lymphs Abs: 1.8 10*3/uL (ref 0.7–4.0)
MCH: 25.2 pg — ABNORMAL LOW (ref 26.0–34.0)
MCHC: 31.9 g/dL (ref 30.0–36.0)
MCV: 79.2 fL — ABNORMAL LOW (ref 80.0–100.0)
Monocytes Absolute: 0.6 10*3/uL (ref 0.1–1.0)
Monocytes Relative: 6 %
Neutro Abs: 6.2 10*3/uL (ref 1.7–7.7)
Neutrophils Relative %: 70 %
Platelets: 396 10*3/uL (ref 150–400)
RBC: 5.15 MIL/uL — ABNORMAL HIGH (ref 3.87–5.11)
RDW: 15.2 % (ref 11.5–15.5)
WBC: 8.8 10*3/uL (ref 4.0–10.5)
nRBC: 0 % (ref 0.0–0.2)

## 2020-07-04 LAB — IRON AND TIBC
Iron: 26 ug/dL — ABNORMAL LOW (ref 28–170)
Saturation Ratios: 5 % — ABNORMAL LOW (ref 10.4–31.8)
TIBC: 493 ug/dL — ABNORMAL HIGH (ref 250–450)
UIBC: 467 ug/dL

## 2020-07-04 LAB — CK: Total CK: 101 U/L (ref 38–234)

## 2020-07-04 LAB — TSH: TSH: 3.697 u[IU]/mL (ref 0.350–4.500)

## 2020-07-04 MED ORDER — PREGABALIN 25 MG PO CAPS: 25.0000 mg | ORAL_CAPSULE | Freq: Two times a day (BID) | ORAL | 3 refills | Status: DC

## 2020-07-04 NOTE — Patient Instructions (Addendum)
You can try tylenol 650 - 1000 up to three times a day for the pain If you would like to try another daily medicine to manage the symptoms I am happy to send in lyrica for you to try - at [employer] or [employer] with GoodRx coupons it is usually affordable.  You may want to try magnesium supplement, B12 or B-complex supplements, and/or iron supplement - because deficiencies in B12, thiamine, folate, magnesium and iron can sometimes cause pain in extremities and/or muscles  I recommend adjusting your thyroid medication dose up until your thyroid labs are in normal range - low thyroid hormones can cause a variety of symptoms.  It may be part of your symptoms - I cannot say 100%      Muscle Pain, Adult Muscle pain (myalgia) may be mild or severe. In most cases, the pain lasts only a short time and it goes away without treatment. It is normal to feel some muscle pain after starting a workout program. Muscles that have not been used often will be sore at first. Muscle pain may also be caused by many other things, including:  Overuse or muscle strain, especially if you are not in shape. This is the most common cause of muscle pain.  Injury.  Bruises.  Viruses, such as the flu.  Infectious diseases.  A chronic condition that causes muscle tenderness, fatigue, and headache (fibromyalgia).  A condition, such as lupus, in which the body's disease-fighting system attacks other organs in the body (autoimmune or rheumatologic diseases).  Certain drugs, including ACE inhibitors and statins. To diagnose the cause of your muscle pain, your health care provider will do a physical exam and ask questions about the pain and when it began. If you have not had muscle pain for very long, your health care provider may want to wait before doing much testing. If your muscle pain has lasted a long time, your health care provider may want to run tests right away. In some cases, this may include tests to rule out  certain conditions or illnesses. Treatment for muscle pain depends on the cause. Home care is often enough to relieve muscle pain. Your health care provider may also prescribe anti-inflammatory medicine. Follow these instructions at home: Activity  If overuse is causing your muscle pain: ? Slow down your activities until the pain goes away. ? Do regular, gentle exercises if you are not usually active. ? Warm up before exercising. Stretch before and after exercising. This can help lower the risk of muscle pain.  Do not continue working out if the pain is very bad. Bad pain could mean that you have injured a muscle. Managing pain and discomfort   If directed, apply ice to the sore muscle: ? Put ice in a plastic bag. ? Place a towel between your skin and the bag. ? Leave the ice on for 20 minutes, 2-3 times a day.  You may also alternate between applying ice and applying heat as told by your health care provider. To apply heat, use the heat source that your health care provider recommends, such as a moist heat pack or a heating pad. ? Place a towel between your skin and the heat source. ? Leave the heat on for 20-30 minutes. ? Remove the heat if your skin turns bright red. This is especially important if you are unable to feel pain, heat, or cold. You may have a greater risk of getting burned. Medicines  Take over-the-counter and prescription medicines only  as told by your health care provider.  Do not drive or use heavy machinery while taking prescription pain medicine. Contact a health care provider if:  Your muscle pain gets worse and medicines do not help.  You have muscle pain that lasts longer than 3 days.  You have a rash or fever along with muscle pain.  You have muscle pain after a tick bite.  You have muscle pain while working out, even though you are in good physical condition.  You have redness, soreness, or swelling along with muscle pain.  You have muscle pain  after starting a new medicine or changing the dose of a medicine. Get help right away if:  You have trouble breathing.  You have trouble swallowing.  You have muscle pain along with a stiff neck, fever, and vomiting.  You have severe muscle weakness or cannot move part of your body. This information is not intended to replace advice given to you by your health care provider. Make sure you discuss any questions you have with your health care provider. Document Revised: 08/22/2017 Document Reviewed: 01/30/2016 Elsevier Patient Education  2020 [employer].    Hypothyroidism  Hypothyroidism is when the thyroid gland does not make enough of certain hormones (it is underactive). The thyroid gland is a small gland located in the lower front part of the neck, just in front of the windpipe (trachea). This gland makes hormones that help control how the body uses food for energy (metabolism) as well as how the heart and brain function. These hormones also play a role in keeping your bones strong. When the thyroid is underactive, it produces too little of the hormones thyroxine (T4) and triiodothyronine (T3). What are the causes? This condition may be caused by:  Hashimoto's disease. This is a disease in which the body's disease-fighting system (immune system) attacks the thyroid gland. This is the most common cause.  Viral infections.  Pregnancy.  Certain medicines.  Birth defects.  Past radiation treatments to the head or neck for cancer.  Past treatment with radioactive iodine.  Past exposure to radiation in the environment.  Past surgical removal of part or all of the thyroid.  Problems with a gland in the center of the brain (pituitary gland).  Lack of enough iodine in the diet. What increases the risk? You are more likely to develop this condition if:  You are female.  You have a family history of thyroid conditions.  You use a medicine called lithium.  You take  medicines that affect the immune system (immunosuppressants). What are the signs or symptoms? Symptoms of this condition include:  Feeling as though you have no energy (lethargy).  Not being able to tolerate cold.  Weight gain that is not explained by a change in diet or exercise habits.  Lack of appetite.  Dry skin.  Coarse hair.  Menstrual irregularity.  Slowing of thought processes.  Constipation.  Sadness or depression. How is this diagnosed? This condition may be diagnosed based on:  Your symptoms, your medical history, and a physical exam.  Blood tests. You may also have imaging tests, such as an ultrasound or MRI. How is this treated? This condition is treated with medicine that replaces the thyroid hormones that your body does not make. After you begin treatment, it may take several weeks for symptoms to go away. Follow these instructions at home:  Take over-the-counter and prescription medicines only as told by your health care provider.  If you start taking  any new medicines, tell your health care provider.  Keep all follow-up visits as told by your health care provider. This is important. ? As your condition improves, your dosage of thyroid hormone medicine may change. ? You will need to have blood tests regularly so that your health care provider can monitor your condition. Contact a health care provider if:  Your symptoms do not get better with treatment.  You are taking thyroid replacement medicine and you: ? Sweat a lot. ? Have tremors. ? Feel anxious. ? Lose weight rapidly. ? Cannot tolerate heat. ? Have emotional swings. ? Have diarrhea. ? Feel weak. Get help right away if you have:  Chest pain.  An irregular heartbeat.  A rapid heartbeat.  Difficulty breathing. Summary  Hypothyroidism is when the thyroid gland does not make enough of certain hormones (it is underactive).  When the thyroid is underactive, it produces too little of  the hormones thyroxine (T4) and triiodothyronine (T3).  The most common cause is Hashimoto's disease, a disease in which the body's disease-fighting system (immune system) attacks the thyroid gland. The condition can also be caused by viral infections, medicine, pregnancy, or past radiation treatment to the head or neck.  Symptoms may include weight gain, dry skin, constipation, feeling as though you do not have energy, and not being able to tolerate cold.  This condition is treated with medicine to replace the thyroid hormones that your body does not make. This information is not intended to replace advice given to you by your health care provider. Make sure you discuss any questions you have with your health care provider. Document Revised: 08/22/2017 Document Reviewed: 08/20/2017 Elsevier Patient Education  2020 [employer].

## 2020-07-04 NOTE — Progress Notes (Signed)
Patient ID: AHJANAE CASSEL, female    DOB: 1961-02-08, 59 y.o.   MRN: [id number]  PCP: Alba Cory, MD  Chief Complaint  Patient presents with  . Arm Pain    left, onset several months  . Leg Pain    left, onset several months    Subjective:   Stephanie Howe is a 59 y.o. female, presents to clinic with CC of the following:  HPI  Left arm pain for months, at least 3-4 months, hurts everywhere she denies any numbness or tingling, no prior injury or incident noted when the pain began she denies any neck pain And chronic left leg pain ongoing for several years, worse over the past couple months  She has complained of the same in the past reviewed at office visit from last year August 2020: Left leg pain onset over a month ago, the whole legs aches like a tooth ache, started gradually, had been intermittent, but over the past month gradually worsening and is more constant and severe-  starting to concern her.  She is worried about a blood clot since a family member had a DVT that went to lungs.  She has hx of some past similar RIGHT leg pain, it was on and off, associated with low back pain.  The right leg pain eased up and left leg pain started, she has not seen anyone for either.  No past xrays, no past back injury and no known osteoporosis/penia or DDD.  She reports that she has had no back pain associated with her constant and severe leg pain but as she sitting the exam when she presses on her low back she does endorse a little bit of left low back and buttocks pain.  Currently her left leg pain rated 8/10, deep "tooth ache" feeling to all muscles to entire leg.  No improvement with resting and elevating, has rubbed aspercream and taken ibupuprofen.  Pain is exacerbated with any use walking, standing), patient states that it hurts with ambulation but there is no worsening pain with longer distances of walking.  She denies any associated numbness, tingling, skin color changes, sores,  wounds, pallor, vein changes, swelling.    Patient Active Problem List   Diagnosis Date Noted  . Aortic atherosclerosis (HCC) 05/18/2019  . Gastroesophageal reflux disease with esophagitis 05/18/2019  . Hx of tobacco use, presenting hazards to health 07/03/2016  . Early menopause 07/03/2016  . Hyperlipidemia 12/27/2015  . Essential hypertension   . COPD (chronic obstructive pulmonary disease) (HCC)   . Hypothyroidism   . Vitamin D deficiency disease       Current Outpatient Medications:  .  albuterol (PROVENTIL) (2.5 MG/3ML) 0.083% nebulizer solution, Take 3 mLs (2.5 mg total) by nebulization every 6 (six) hours as needed for wheezing or shortness of breath., Disp: 150 mL, Rfl: 1 .  amLODipine (NORVASC) 5 MG tablet, Take 1 tablet (5 mg total) by mouth daily., Disp: 90 tablet, Rfl: 1 .  aspirin EC 81 MG tablet, Take 81 mg by mouth daily., Disp: , Rfl:  .  famotidine (PEPCID) 20 MG tablet, Take 1 tablet (20 mg total) by mouth daily as needed for heartburn or indigestion., Disp: 90 tablet, Rfl: 1 .  levothyroxine (SYNTHROID) 50 MCG tablet, TAKE ONE AND ONE-HALF TABLET BY MOUTH ON MONDAY, WEDNESDAY, AND FRIDAY. TAKE ONE TABLET ON SUNDAY, TUESDAY, THURSDAY, AND SATURDAY, Disp: 180 tablet, Rfl: 1 .  rosuvastatin (CRESTOR) 20 MG tablet, Take 1 tablet (20 mg total)  by mouth at bedtime., Disp: 90 tablet, Rfl: 1 .  Vitamin D, Ergocalciferol, (DRISDOL) 1.25 MG (50000 UNIT) CAPS capsule, Take 1 capsule (50,000 Units total) by mouth every 7 (seven) days. x12 weeks., Disp: 12 capsule, Rfl: 1   Allergies  Allergen Reactions  . Codeine   . Penicillins      Social History   Tobacco Use  . Smoking status: Former Smoker    Packs/day: 1.50    Years: 30.00    Pack years: 45.00    Types: Cigarettes    Quit date: 09/24/2011    Years since quitting: 8.7  . Smokeless tobacco: Never Used  Vaping Use  . Vaping Use: Never used  Substance Use Topics  . Alcohol use: No  . Drug use: No      Chart  Review Today: I personally reviewed active problem list, medication list, allergies, family history, social history, health maintenance, notes from last encounter, lab results, imaging with the patient/caregiver today.   Review of Systems 10 Systems reviewed and are negative for acute change except as noted in the HPI.     Objective:   Vitals:   07/04/20 1306  BP: 132/84  Pulse: 98  Resp: 14  Temp: 98.5 F (36.9 C)  SpO2: 95%  Weight: 133 lb 4.8 oz (60.5 kg)  Height: 5' (1.524 m)    Body mass index is 26.03 kg/m.  Physical Exam Vitals and nursing note reviewed.  Constitutional:      General: She is not in acute distress.    Appearance: Normal appearance. She is well-developed. She is not toxic-appearing or diaphoretic.     Comments: Slightly anxious female, NAD, appears older than stated age  HENT:     Head: Normocephalic and atraumatic.     Right Ear: External ear normal.     Left Ear: External ear normal.     Mouth/Throat:     Mouth: Mucous membranes are moist.     Pharynx: Uvula midline.  Eyes:     General: Lids are normal. No scleral icterus.    Conjunctiva/sclera: Conjunctivae normal.     Pupils: Pupils are equal, round, and reactive to light.  Neck:     Thyroid: No thyromegaly.     Trachea: Phonation normal. No tracheal deviation.  Cardiovascular:     Rate and Rhythm: Normal rate and regular rhythm.  No extrasystoles are present.    Chest Wall: PMI is not displaced.     Pulses: Normal pulses.          Radial pulses are 2+ on the right side and 2+ on the left side.       Dorsalis pedis pulses are 2+ on the right side and 2+ on the left side.       Posterior tibial pulses are 2+ on the right side and 2+ on the left side.     Heart sounds: Normal heart sounds. Heart sounds not distant. No murmur heard.  No friction rub. No gallop.      Comments: No LE pallor, edema, erythema, varicosities, warm to the touch b/l Pulmonary:     Effort: Pulmonary effort is  normal. No bradypnea or respiratory distress.     Breath sounds: Normal breath sounds. No stridor, decreased air movement or transmitted upper airway sounds. No decreased breath sounds, wheezing, rhonchi or rales.  Chest:     Chest wall: No tenderness.  Abdominal:     General: Bowel sounds are normal. There is no distension.  Palpations: Abdomen is soft.     Tenderness: There is no abdominal tenderness. There is no guarding or rebound.  Musculoskeletal:        General: No deformity. Normal range of motion.     Cervical back: Normal range of motion and neck supple.     Right lower leg: No edema.     Left lower leg: No edema.     Comments: No midline tenderness from cervical to lumbar spine No paraspinal muscle tenderness from cervical to lumbar spine 5/5 strength dorsiflexion and plantarflexion b/l and 5/5 strength with flexion and extension at the knees b/l Grossly normal sensation to light touch in b/l LE No LE edema, pallor, extremities warm to the touch, good pulses in extremities Normal aROM of left hip, back, bilateral knees Left leg- All muscle compartments of left leg (calf, hamstrings, quad) soft Left arm normal ROM of shoulder, elbow and wrist, no ttp to bones or muscle, muscle compartments soft No edema, induration, erythema No noted effusion or swelling  Lymphadenopathy:     Cervical: No cervical adenopathy.  Skin:    General: Skin is warm and dry.     Capillary Refill: Capillary refill takes less than 2 seconds.     Coloration: Skin is not ashen, cyanotic, jaundiced, mottled, pale or sallow.     Findings: No bruising or rash.     Nails: There is no clubbing.  Neurological:     Mental Status: She is alert and oriented to person, place, and time.     Motor: No abnormal muscle tone.     Gait: Gait normal.  Psychiatric:        Attention and Perception: Attention normal.        Mood and Affect: Affect normal. Mood is anxious.        Speech: Speech normal.         Behavior: Behavior normal. Behavior is cooperative.      Results for orders placed or performed in visit on 07/20/19  Novel Coronavirus, NAA ([facility])   Specimen: Oropharyngeal(OP) collection in vial transport medium   OROPHARYNGEA  TESTING  Result Value Ref Range   SARS-CoV-2, NAA Not Detected Not Detected       Assessment & Plan:     ICD-10-CM   1. Acquired hypothyroidism  E03.9 TSH   Advise rechecking thyroid level to ensure this is not out of normal range or causing her symptoms  2. Abnormal TSH  R79.89 TSH   Encouraged her to recheck  3. Paresthesia  R20.2 CBC with Differential/Platelet    TSH    IBC panel   Unknown cause would rule out thyroid dysfunction, iron deficiency, other nutritional deficiencies such as B12  4. Pain of left lower extremity  M79.605    Have previously discussed this with the patient that etiology could be blood flow, deficiency, low back pain/lumbar radiculopathy  5. Upper extremity pain, diffuse, left  M79.602    Nonfocal, not reducible, normal range of motion, muscle compartment soft  6. Myalgia  M79.10 CK (Creatine Kinase)   Explained to patient we could check some blood work ensure there is no muscle damage, could do autoimmune work-up       Danelle Berry, PA-C 07/04/20 1:39 PM

## 2020-08-28 ENCOUNTER — Ambulatory Visit: Payer: Self-pay | Admitting: Family Medicine

## 2020-10-16 ENCOUNTER — Telehealth: Payer: Self-pay

## 2020-10-16 NOTE — Telephone Encounter (Signed)
Called pt to get her scheduled and she declined an appt due to no insurance,  States daughter just wanted her to inform our office. She states she feels pretty good. Copied from CRM [id number]. Topic: General - Inquiry >> Oct 16, 2020 10:00 AM Daphine Deutscher D wrote: Reason for CRM: Pt called saying she tested positive for covid 2 days ago with a home test,  she is having headache, cough with copd, chills.  She would like a nurse to call back.  CB#  [phone]

## 2020-10-24 ENCOUNTER — Ambulatory Visit (INDEPENDENT_AMBULATORY_CARE_PROVIDER_SITE_OTHER): Payer: Self-pay | Admitting: Family Medicine

## 2020-10-24 ENCOUNTER — Encounter: Payer: Self-pay | Admitting: Family Medicine

## 2020-10-24 VITALS — Ht 61.0 in | Wt 133.0 lb

## 2020-10-24 DIAGNOSIS — Z91199 Patient's noncompliance with other medical treatment and regimen due to unspecified reason: Secondary | ICD-10-CM

## 2020-10-24 DIAGNOSIS — M546 Pain in thoracic spine: Secondary | ICD-10-CM

## 2020-10-24 DIAGNOSIS — R079 Chest pain, unspecified: Secondary | ICD-10-CM

## 2020-10-24 DIAGNOSIS — Z5329 Procedure and treatment not carried out because of patient's decision for other reasons: Secondary | ICD-10-CM

## 2020-10-24 DIAGNOSIS — Z8701 Personal history of pneumonia (recurrent): Secondary | ICD-10-CM

## 2020-10-24 NOTE — Progress Notes (Signed)
Name: Stephanie Howe   MRN: [id number]    DOB: [birth year]   Date:10/24/2020       Progress Note  Subjective:    Chief Complaint  Chief Complaint  Patient presents with  . Cough  . Back Pain    Hurts to breath in same symptoms with pneumonia, covid positive 2 weeks ago    I connected with  Weston Anna on 10/24/20 at  2:00 PM EST by telephone and verified that I am speaking with the correct person using two identifiers.   I discussed the limitations, risks, security and privacy concerns of performing an evaluation and management service by telephone and the availability of in person appointments. Staff also discussed with the patient that there may be a patient responsible charge related to this service.  Patient verbalized understanding and agreed to proceed with encounter. Patient Location:  Provider Location:  Additional Individuals present:   HPI   Called pt no answer - left VM  Presents with back pain, pain with breathing, COVID positive 2 weeks ago with URI and respiratory sx.      Patient Active Problem List   Diagnosis Date Noted  . Aortic atherosclerosis (HCC) 05/18/2019  . Gastroesophageal reflux disease with esophagitis 05/18/2019  . Hx of tobacco use, presenting hazards to health 07/03/2016  . Early menopause 07/03/2016  . Hyperlipidemia 12/27/2015  . Essential hypertension   . COPD (chronic obstructive pulmonary disease) (HCC)   . Hypothyroidism   . Vitamin D deficiency disease     Social History   Tobacco Use  . Smoking status: Former Smoker    Packs/day: 1.50    Years: 30.00    Pack years: 45.00    Types: Cigarettes    Quit date: 09/24/2011    Years since quitting: 9.0  . Smokeless tobacco: Never Used  Substance Use Topics  . Alcohol use: No     Current Outpatient Medications:  .  albuterol (PROVENTIL) (2.5 MG/3ML) 0.083% nebulizer solution, Take 3 mLs (2.5 mg total) by nebulization every 6 (six) hours as needed for wheezing or shortness of  breath., Disp: 150 mL, Rfl: 1 .  amLODipine (NORVASC) 5 MG tablet, Take 1 tablet (5 mg total) by mouth daily., Disp: 90 tablet, Rfl: 1 .  aspirin EC 81 MG tablet, Take 81 mg by mouth daily., Disp: , Rfl:  .  famotidine (PEPCID) 20 MG tablet, Take 1 tablet (20 mg total) by mouth daily as needed for heartburn or indigestion., Disp: 90 tablet, Rfl: 1 .  levothyroxine (SYNTHROID) 50 MCG tablet, TAKE ONE AND ONE-HALF TABLET BY MOUTH ON MONDAY, WEDNESDAY, AND FRIDAY. TAKE ONE TABLET ON SUNDAY, TUESDAY, THURSDAY, AND SATURDAY, Disp: 180 tablet, Rfl: 1 .  Vitamin D, Ergocalciferol, (DRISDOL) 1.25 MG (50000 UNIT) CAPS capsule, Take 1 capsule (50,000 Units total) by mouth every 7 (seven) days. x12 weeks., Disp: 12 capsule, Rfl: 1 .  pregabalin (LYRICA) 25 MG capsule, Take 1 capsule (25 mg total) by mouth 2 (two) times daily. (Patient not taking: Reported on 10/24/2020), Disp: 60 capsule, Rfl: 3 .  rosuvastatin (CRESTOR) 20 MG tablet, Take 1 tablet (20 mg total) by mouth at bedtime. (Patient not taking: Reported on 10/24/2020), Disp: 90 tablet, Rfl: 1  Allergies  Allergen Reactions  . Codeine   . Penicillins     Chart Review:   Review of Systems   Objective:    Virtual encounter, vitals limited, only able to obtain the following Today's Vitals   10/24/20 1307  Weight: 133 lb (60.3 kg)  Height: 5\' 1"  (1.549 m)  PainSc: 3    Body mass index is 25.13 kg/m. Nursing Note and Vital Signs reviewed.  Physical Exam  PE limited by telephone encounter  No results found for this or any previous visit (from the past 72 hour(s)).  Assessment and Plan:   No diagnosis found.  -Red flags and when to present for emergency care or RTC including but not limited to new/worsening/un-resolving symptoms, reviewed with patient at time of visit. Follow up and care instructions discussed and provided in AVS. - I discussed the assessment and treatment plan with the patient. The patient was provided an opportunity  to ask questions and all were answered. The patient agreed with the plan and demonstrated an understanding of the instructions.  - The patient was advised to call back or seek an in-person evaluation if the symptoms worsen or if the condition fails to improve as anticipated.  I provided  minutes of non-face-to-face time during this encounter.  , PA-C 10/24/20 2:39 PM

## 2020-12-21 ENCOUNTER — Other Ambulatory Visit: Payer: Self-pay | Admitting: Family Medicine

## 2020-12-21 DIAGNOSIS — K2100 Gastro-esophageal reflux disease with esophagitis, without bleeding: Secondary | ICD-10-CM

## 2021-02-08 ENCOUNTER — Telehealth: Payer: Self-pay | Admitting: Family Medicine

## 2021-02-08 DIAGNOSIS — I10 Essential (primary) hypertension: Secondary | ICD-10-CM

## 2021-02-09 NOTE — Telephone Encounter (Signed)
Pt will call back to schedule

## 2021-02-19 ENCOUNTER — Other Ambulatory Visit: Payer: Self-pay | Admitting: Family Medicine

## 2021-02-19 DIAGNOSIS — Z1211 Encounter for screening for malignant neoplasm of colon: Secondary | ICD-10-CM

## 2021-05-07 ENCOUNTER — Telehealth: Payer: Self-pay

## 2021-05-07 NOTE — Telephone Encounter (Signed)
Client recently enrolled in [facility] program. She was called today to assist her to make an appointment to establish primary medical care with [facility]. Her appointment is 05/10/21 at 10:30 am. And she is encouraged to take any current medications with her to that appointment.  With Client's permission texted appointment to her phone along with address and phone number of [facility].  Plan: Will follow up with client by phone after her first visit. Client agreeable.  Francee Nodal RN [employer]

## 2021-05-10 ENCOUNTER — Other Ambulatory Visit: Payer: Self-pay

## 2021-05-10 ENCOUNTER — Other Ambulatory Visit: Payer: Self-pay | Admitting: Physician Assistant

## 2021-05-10 ENCOUNTER — Encounter: Payer: Self-pay | Admitting: Physician Assistant

## 2021-05-10 ENCOUNTER — Other Ambulatory Visit (HOSPITAL_COMMUNITY)
Admission: RE | Admit: 2021-05-10 | Discharge: 2021-05-10 | Disposition: A | Discharge status: Home / Self Care | Payer: Self-pay | Source: Ambulatory Visit | Attending: Physician Assistant | Admitting: Physician Assistant

## 2021-05-10 ENCOUNTER — Ambulatory Visit: Payer: Self-pay | Admitting: Physician Assistant

## 2021-05-10 ENCOUNTER — Telehealth: Payer: Self-pay

## 2021-05-10 VITALS — BP 144/84 | HR 80 | Temp 98.3°F | Ht 60.0 in | Wt 125.0 lb

## 2021-05-10 DIAGNOSIS — Z8639 Personal history of other endocrine, nutritional and metabolic disease: Secondary | ICD-10-CM

## 2021-05-10 DIAGNOSIS — I10 Essential (primary) hypertension: Secondary | ICD-10-CM

## 2021-05-10 DIAGNOSIS — Z13220 Encounter for screening for lipoid disorders: Secondary | ICD-10-CM

## 2021-05-10 DIAGNOSIS — Z1211 Encounter for screening for malignant neoplasm of colon: Secondary | ICD-10-CM

## 2021-05-10 DIAGNOSIS — Z1239 Encounter for other screening for malignant neoplasm of breast: Secondary | ICD-10-CM

## 2021-05-10 DIAGNOSIS — E039 Hypothyroidism, unspecified: Secondary | ICD-10-CM | POA: Insufficient documentation

## 2021-05-10 DIAGNOSIS — K2100 Gastro-esophageal reflux disease with esophagitis, without bleeding: Secondary | ICD-10-CM

## 2021-05-10 DIAGNOSIS — Z7689 Persons encountering health services in other specified circumstances: Secondary | ICD-10-CM

## 2021-05-10 LAB — COMPREHENSIVE METABOLIC PANEL
ALT: 12 U/L (ref 0–44)
AST: 19 U/L (ref 15–41)
Albumin: 4.5 g/dL (ref 3.5–5.0)
Alkaline Phosphatase: 57 U/L (ref 38–126)
Anion gap: 7 (ref 5–15)
BUN: 11 mg/dL (ref 8–23)
CO2: 28 mmol/L (ref 22–32)
Calcium: 9.7 mg/dL (ref 8.9–10.3)
Chloride: 104 mmol/L (ref 98–111)
Creatinine, Ser: 0.69 mg/dL (ref 0.44–1.00)
GFR, Estimated: >60 mL/min (ref >60)
Glucose, Bld: 96 mg/dL (ref 70–99)
Potassium: 4.3 mmol/L (ref 3.5–5.1)
Sodium: 139 mmol/L (ref 135–145)
Total Bilirubin: 0.7 mg/dL (ref 0.3–1.2)
Total Protein: 7.3 g/dL (ref 6.5–8.1)

## 2021-05-10 LAB — LIPID PANEL
Cholesterol: 216 mg/dL — ABNORMAL HIGH (ref 0–200)
HDL: 83 mg/dL (ref >40)
LDL Cholesterol: 115 mg/dL — ABNORMAL HIGH (ref 0–99)
Total CHOL/HDL Ratio: 2.6 RATIO
Triglycerides: 89 mg/dL (ref <150)
VLDL: 18 mg/dL (ref 0–40)

## 2021-05-10 LAB — TSH: TSH: 4.324 u[IU]/mL (ref 0.350–4.500)

## 2021-05-10 MED ORDER — AMLODIPINE BESYLATE 5 MG PO TABS: 5.0000 mg | ORAL_TABLET | Freq: Every day | ORAL | 1 refills | Status: DC

## 2021-05-10 MED ORDER — LEVOTHYROXINE SODIUM 50 MCG PO TABS: ORAL_TABLET | ORAL | 1 refills | Status: DC

## 2021-05-10 NOTE — Telephone Encounter (Signed)
Follow up with client after [facility]. She states visit went well and she has had her labs drawn. She states provider called in her Synthroid and Amlodipine. Client states she is having problems with her eyesight and she "feels like a film over one" client is not a diabetic. Explained to client about [facility] and [facility] is awaiting approval. Once she has been approved a note will be place in her medical record that her provider at [facility] can see and use for any medications that are available through that program. Explained how program works regarding refills etc. Explained that client should also receive a letter in the mail letting her know she has been approved starting date and date that she will need to renew and that eligibility is for one year. Client reported understanding.  Plan: Referral made to [employer] for eye exam: discussed that approval is up to the [employer] and available funds. Discussed that once approved she will have an eye appointment at [facility] in [place] and they will contact her with appointment. Discussed that based on that exam would discuss any next steps if needed.  Client made aware that [employer] voucher covers basic eye exam and basic glasses up to a certain amount.  Will follow up with [facility] and update client's care coordination note once approved.  No further needs at this time.  Francee Nodal RN [employer]

## 2021-05-10 NOTE — Progress Notes (Signed)
BP (!) 144/84   Pulse 80   Temp 98.3 F (36.8 C)   Ht 5' (1.524 m)   Wt 125 lb (56.7 kg)   SpO2 93%   BMI 24.41 kg/m    Subjective:    Patient ID: Stephanie Howe, female    DOB: May 11, 1961, 60 y.o.   MRN: [id number]  HPI: Stephanie Howe is a 60 y.o. female presenting on 05/10/2021 for No chief complaint on file.   HPI  Pt had a negative covid 19 screening questionnaire.     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  She provides no reason for not wanting to get it when asked except that she doesn't like it.    Relevant past medical, surgical, family and social history reviewed and updated as indicated. Interim medical history since our last visit reviewed. Allergies and medications reviewed and updated.   Current Outpatient Medications:    albuterol (PROVENTIL) (2.5 MG/3ML) 0.083% nebulizer solution, Take 3 mLs (2.5 mg total) by nebulization every 6 (six) hours as needed for wheezing or shortness of breath., Disp: 150 mL, Rfl: 1   amLODipine (NORVASC) 5 MG tablet, Take 1 tablet by mouth once daily, Disp: 30 tablet, Rfl: 0   aspirin EC 81 MG tablet, Take 81 mg by mouth daily., Disp: , Rfl:    famotidine (PEPCID) 20 MG tablet, TAKE 1 TABLET BY MOUTH ONCE DAILY AS NEEDED FOR HEARTBURN OR INDIGESTION, Disp: 90 tablet, Rfl: 0   levothyroxine (SYNTHROID) 50 MCG tablet, TAKE ONE AND ONE-HALF TABLET BY MOUTH ON MONDAY, WEDNESDAY, AND FRIDAY. TAKE ONE TABLET ON SUNDAY, TUESDAY, THURSDAY, AND SATURDAY, Disp: 180 tablet, Rfl: 1   Vitamin D, Ergocalciferol, (DRISDOL) 1.25 MG (50000 UNIT) CAPS capsule, Take 1 capsule (50,000 Units total) by mouth every  7 (seven) days. x12 weeks., Disp: 12 capsule, Rfl: 1    Review of Systems  Per HPI unless specifically indicated above     Objective:    BP (!) 144/84   Pulse 80   Temp 98.3 F (36.8 C)   Ht 5' (1.524 m)   Wt 125 lb (56.7 kg)   SpO2 93%   BMI 24.41 kg/m   Wt Readings from Last 3 Encounters:  05/10/21 125 lb (56.7 kg)  10/24/20 133 lb (60.3 kg)  07/04/20 133 lb 4.8 oz (60.5 kg)    Physical Exam Vitals reviewed.  Constitutional:      General: She is not in acute distress.    Appearance: She is well-developed and normal weight. She is not ill-appearing.  HENT:     Head: Normocephalic and atraumatic.     Right Ear: Tympanic membrane, ear canal and external ear normal.     Left Ear: Tympanic membrane, ear canal and external ear normal.  Eyes:     Extraocular Movements: Extraocular movements intact.     Conjunctiva/sclera: Conjunctivae normal.     Pupils: Pupils are equal, round, and reactive to light.  Neck:     Thyroid: No thyromegaly.  Cardiovascular:     Rate and Rhythm: Normal rate and regular rhythm.  Pulmonary:     Effort: Pulmonary effort is normal.     Breath sounds: Normal breath sounds.  Abdominal:     General: Bowel sounds are normal.     Palpations: Abdomen is soft. There is no mass.     Tenderness: There is no abdominal tenderness.  Musculoskeletal:     Cervical back: Neck supple.     Right lower leg: No edema.     Left lower leg: No edema.  Lymphadenopathy:     Cervical: No cervical adenopathy.  Skin:    General: Skin is warm and dry.  Neurological:     Mental Status: She is alert and oriented to person, place, and time.     Gait: Gait normal.  Psychiatric:        Behavior: Behavior normal.           Assessment & Plan:    Encounter Diagnoses  Name Primary?   Encounter to establish care Yes   Essential hypertension    Acquired hypothyroidism    Gastroesophageal reflux disease with esophagitis    Screening cholesterol level     History of vitamin D deficiency    Encounter for screening for malignant neoplasm of breast, unspecified screening modality         -will update labs -will refer for screening Mammogram -pt is given FIT test for colon cancer screening -will Update labs -encouraged pt to get covid vaccination but she is resistant to listening on this topic -Pt to follow up  1 month to recheck bp and review labs.  She is to contact office sooner prn

## 2021-05-14 ENCOUNTER — Other Ambulatory Visit: Payer: Self-pay

## 2021-05-14 DIAGNOSIS — Z1231 Encounter for screening mammogram for malignant neoplasm of breast: Secondary | ICD-10-CM

## 2021-05-14 NOTE — Addendum Note (Signed)
Addended by: Genia Harold on: 05/14/2021 09:50 AM   Modules accepted: Orders

## 2021-05-14 NOTE — Addendum Note (Signed)
Addended by: Genia Harold on: 05/14/2021 10:05 AM   Modules accepted: Orders

## 2021-05-15 ENCOUNTER — Telehealth: Payer: Self-pay

## 2021-05-15 NOTE — Telephone Encounter (Signed)
Called pt to inform of upcoming mammogram appt & instructions given, pt understood

## 2021-05-18 LAB — VITAMIN D 1,25 DIHYDROXY
Vitamin D 1, 25 (OH)2 Total: 33 pg/mL
Vitamin D2 1, 25 (OH)2: <10 pg/mL
Vitamin D3 1, 25 (OH)2: 25 pg/mL

## 2021-05-19 LAB — IFOBT (OCCULT BLOOD): IFOBT: NEGATIVE

## 2021-05-30 ENCOUNTER — Ambulatory Visit (HOSPITAL_COMMUNITY): Payer: Self-pay

## 2021-05-30 ENCOUNTER — Other Ambulatory Visit: Payer: Self-pay

## 2021-05-30 ENCOUNTER — Ambulatory Visit (HOSPITAL_COMMUNITY)
Admission: RE | Admit: 2021-05-30 | Discharge: 2021-05-30 | Disposition: A | Discharge status: Home / Self Care | Payer: Self-pay | Source: Ambulatory Visit | Attending: Physician Assistant | Admitting: Physician Assistant

## 2021-05-30 DIAGNOSIS — Z1231 Encounter for screening mammogram for malignant neoplasm of breast: Secondary | ICD-10-CM | POA: Insufficient documentation

## 2021-06-12 ENCOUNTER — Other Ambulatory Visit: Payer: Self-pay

## 2021-06-12 ENCOUNTER — Encounter: Payer: Self-pay | Admitting: Physician Assistant

## 2021-06-12 ENCOUNTER — Ambulatory Visit: Payer: Self-pay | Admitting: Physician Assistant

## 2021-06-12 VITALS — BP 149/79 | HR 86 | Temp 98.0°F | Wt 124.0 lb

## 2021-06-12 DIAGNOSIS — I10 Essential (primary) hypertension: Secondary | ICD-10-CM

## 2021-06-12 DIAGNOSIS — E039 Hypothyroidism, unspecified: Secondary | ICD-10-CM

## 2021-06-12 DIAGNOSIS — R413 Other amnesia: Secondary | ICD-10-CM

## 2021-06-12 MED ORDER — LEVOTHYROXINE SODIUM 50 MCG PO TABS: ORAL_TABLET | ORAL | 1 refills | Status: DC

## 2021-06-12 MED ORDER — AMLODIPINE BESYLATE 5 MG PO TABS: 5.0000 mg | ORAL_TABLET | Freq: Every day | ORAL | 1 refills | Status: DC

## 2021-06-12 NOTE — Progress Notes (Signed)
BP (!) 149/79   Pulse 86   Temp 98 F (36.7 C)   Wt 124 lb (56.2 kg)   SpO2 96%   BMI 24.22 kg/m    Subjective:    Patient ID: Stephanie Howe, female    DOB: May 27, 1961, 60 y.o.   MRN: [id number]  HPI: Stephanie Howe is a 60 y.o. female presenting on 06/12/2021 for Hypertension   HPI  Pt had a negative covid 19 screening questionnaire.      Pt is 60yoF who presents to followup HTN and evaluate memory issues.  Pt has been out of all meds for over a week.  She is working again- at [employer].  Pt reports problems with memory for several months and it seems to be getting worse.    No familty history of dementia.  No history of drugs or heavy etoh.    She lives with her sister who has noticed that she has been having problems with her memory.     Relevant past medical, surgical, family and social history reviewed and updated as indicated. Interim medical history since our last visit reviewed. Allergies and medications reviewed and updated.  CURRENT MEDS: None    Review of Systems  Per HPI unless specifically indicated above     Objective:    BP (!) 149/79   Pulse 86   Temp 98 F (36.7 C)   Wt 124 lb (56.2 kg)   SpO2 96%   BMI 24.22 kg/m   Wt Readings from Last 3 Encounters:  06/12/21 124 lb (56.2 kg)  05/10/21 125 lb (56.7 kg)  10/24/20 133 lb (60.3 kg)    Physical Exam Vitals reviewed.  Constitutional:      General: She is not in acute distress.    Appearance: She is well-developed. She is not ill-appearing.  HENT:     Head: Normocephalic and atraumatic.  Cardiovascular:     Rate and Rhythm: Normal rate and regular rhythm.  Pulmonary:     Effort: Pulmonary effort is normal.     Breath sounds: Normal breath sounds.  Abdominal:     General: Bowel sounds are normal.     Palpations: Abdomen is soft. There is no mass.     Tenderness: There is no abdominal tenderness.  Musculoskeletal:     Cervical back: Neck supple.  Lymphadenopathy:      Cervical: No cervical adenopathy.  Skin:    General: Skin is warm and dry.  Neurological:     Mental Status: She is alert and oriented to person, place, and time.  Psychiatric:        Behavior: Behavior normal.    Results for orders placed or performed during the hospital encounter of 05/10/21  Vitamin D 1,25 dihydroxy  Result Value Ref Range   Vitamin D 1, 25 (OH)2 Total 33 pg/mL   Vitamin D3 1, 25 (OH)2 25 pg/mL   Vitamin D2 1, 25 (OH)2 <10 pg/mL  Comprehensive metabolic panel  Result Value Ref Range   Sodium 139 135 - 145 mmol/L   Potassium 4.3 3.5 - 5.1 mmol/L   Chloride 104 98 - 111 mmol/L   CO2 28 22 - 32 mmol/L   Glucose, Bld 96 70 - 99 mg/dL   BUN 11 8 - 23 mg/dL   Creatinine, Ser 7.82 0.44 - 1.00 mg/dL   Calcium 9.7 8.9 - 95.6 mg/dL   Total Protein 7.3 6.5 - 8.1 g/dL   Albumin 4.5 3.5 - 5.0 g/dL  AST 19 15 - 41 U/L   ALT 12 0 - 44 U/L   Alkaline Phosphatase 57 38 - 126 U/L   Total Bilirubin 0.7 0.3 - 1.2 mg/dL   GFR, Estimated >01 >65 mL/min   Anion gap 7 5 - 15  Lipid panel  Result Value Ref Range   Cholesterol 216 (H) 0 - 200 mg/dL   Triglycerides 89 <537 mg/dL   HDL 83 >48 mg/dL   Total CHOL/HDL Ratio 2.6 RATIO   VLDL 18 0 - 40 mg/dL   LDL Cholesterol 270 (H) 0 - 99 mg/dL  TSH  Result Value Ref Range   TSH 4.324 0.350 - 4.500 uIU/mL      Assessment & Plan:    Encounter Diagnoses  Name Primary?   Essential hypertension    Acquired hypothyroidism    Memory change Yes    -Reviewed labs with pt -pt to Continue Amdopine and levothyroxine.  Pt is encouraged to avoid running out of her medicines -MMSE completed.  It was complicated by the pt's education level- she did not complete the 8th grade.  Pt scored 26.  Discussed with pt that the best option for evaluation and possible treatment would be participating in a research study.  Discussed taht there are several going on in the are, including in [place].  Pt does not want to o to [place].  She is  given the name of the resarch place if she changes her mind.  Will re-evaluate with MMSE in several months

## 2021-06-12 NOTE — Patient Instructions (Signed)
[facility]

## 2021-06-27 ENCOUNTER — Telehealth: Payer: Self-pay

## 2021-06-27 NOTE — Telephone Encounter (Signed)
Attempted to call client to update her on the status of her [employer] referral.  No answer left voicemail with details. She has been approved and should receive her approval letter this week in the mail with instructions and what the assistance will cover.   Francee Nodal RN Clara Gunn/[facility]

## 2021-08-08 ENCOUNTER — Telehealth: Payer: Self-pay

## 2021-08-08 ENCOUNTER — Encounter: Payer: Self-pay | Admitting: Physician Assistant

## 2021-08-08 ENCOUNTER — Ambulatory Visit: Payer: Self-pay | Admitting: Physician Assistant

## 2021-08-08 ENCOUNTER — Other Ambulatory Visit: Payer: Self-pay

## 2021-08-08 VITALS — BP 162/84 | HR 101 | Temp 97.5°F | Wt 124.0 lb

## 2021-08-08 DIAGNOSIS — R413 Other amnesia: Secondary | ICD-10-CM

## 2021-08-08 DIAGNOSIS — I10 Essential (primary) hypertension: Secondary | ICD-10-CM

## 2021-08-08 DIAGNOSIS — E039 Hypothyroidism, unspecified: Secondary | ICD-10-CM

## 2021-08-08 DIAGNOSIS — B354 Tinea corporis: Secondary | ICD-10-CM

## 2021-08-08 DIAGNOSIS — R519 Headache, unspecified: Secondary | ICD-10-CM

## 2021-08-08 MED ORDER — CLOTRIMAZOLE-BETAMETHASONE 1-0.05 % EX CREA: 1.0000 "application " | TOPICAL_CREAM | Freq: Two times a day (BID) | CUTANEOUS | 1 refills | Status: DC

## 2021-08-08 NOTE — Telephone Encounter (Signed)
Called [facility] to check on Rx order that was never filled, pharmacist unsure as to why pt medications not ordered or filled but stated they are putting order in now & it will be sent to pt.

## 2021-08-08 NOTE — Patient Instructions (Signed)
[employer] for hands/skin

## 2021-08-08 NOTE — Progress Notes (Signed)
BP (!) 162/84   Pulse (!) 101   Temp (!) 97.5 F (36.4 C)   Wt 124 lb (56.2 kg)   SpO2 93%   BMI 24.22 kg/m    Subjective:    Patient ID: Stephanie Howe, female    DOB: 28-May-1961, 60 y.o.   MRN: [id number]  HPI: Stephanie Howe is a 60 y.o. female presenting on 08/08/2021 for Hypertension, Thyroid Problem, and Memory Loss   HPI  Chief Complaint  Patient presents with   Hypertension   Thyroid Problem   Memory Loss    Pt is Still working at [employer] and living with sister  She takes her meds but not every day.  She forgots to take them sometimes  She has Some HA today but has not taken anything for the HA.  Pt still with memory problems but doesn't want to go to [place] to participate in study (as discussed at previous OV)  She reports some Spots on leg but she cant get it to go away  She checks her bp at home and it's normal/in the 120s    Relevant past medical, surgical, family and social history reviewed and updated as indicated. Interim medical history since our last visit reviewed. Allergies and medications reviewed and updated.   Current Outpatient Medications:    amLODipine (NORVASC) 5 MG tablet, Take 1 tablet (5 mg total) by mouth daily., Disp: 90 tablet, Rfl: 1   levothyroxine (SYNTHROID) 50 MCG tablet, TAKE ONE AND ONE-HALF TABLET BY MOUTH ON MONDAY, WEDNESDAY, AND FRIDAY. TAKE ONE TABLET ON SUNDAY, TUESDAY, THURSDAY, AND SATURDAY, Disp: 135 tablet, Rfl: 1     Review of Systems  Per HPI unless specifically indicated above     Objective:    BP (!) 162/84   Pulse (!) 101   Temp (!) 97.5 F (36.4 C)   Wt 124 lb (56.2 kg)   SpO2 93%   BMI 24.22 kg/m   Wt Readings from Last 3 Encounters:  08/08/21 124 lb (56.2 kg)  06/12/21 124 lb (56.2 kg)  05/10/21 125 lb (56.7 kg)    Physical Exam Vitals reviewed.  Constitutional:      General: She is not in acute distress.    Appearance: She is well-developed. She is not ill-appearing.  HENT:      Head: Normocephalic and atraumatic.  Cardiovascular:     Rate and Rhythm: Normal rate and regular rhythm.  Pulmonary:     Effort: Pulmonary effort is normal.     Breath sounds: Normal breath sounds.  Abdominal:     General: Bowel sounds are normal.     Palpations: Abdomen is soft. There is no mass.     Tenderness: There is no abdominal tenderness.  Musculoskeletal:     Cervical back: Neck supple.     Right lower leg: No edema.     Left lower leg: No edema.  Lymphadenopathy:     Cervical: No cervical adenopathy.  Skin:    General: Skin is warm and dry.     Comments: 2 dry well-demarcated hyperpigmented places on RLE- one about 1 inch diamete and the other about 1/2 inch diameter.  No papules or purulence  Neurological:     Mental Status: She is alert and oriented to person, place, and time.     Motor: No weakness.     Gait: Gait normal.  Psychiatric:        Behavior: Behavior normal.        Assessment &  Plan:   Encounter Diagnoses  Name Primary?   Essential hypertension Yes   Acquired hypothyroidism    Memory change    Tinea corporis    Acute nonintractable headache, unspecified headache type       -recommended pt Keep meds with toothbrush so she can remember to take her meds daily.  Discussed with pt the Need to take them daily -she is encouraged to go home, get something to eat (she has not eaten yet today) and take some APAP or IBU.  She she contact office if her HA persists or she develops any other symptoms -rx lotrisone to use on rash -pt to follow up 65months.  She is to contact office sooner prn

## 2021-10-29 ENCOUNTER — Other Ambulatory Visit: Payer: Self-pay | Admitting: Physician Assistant

## 2021-10-29 DIAGNOSIS — I10 Essential (primary) hypertension: Secondary | ICD-10-CM

## 2021-10-29 DIAGNOSIS — E039 Hypothyroidism, unspecified: Secondary | ICD-10-CM

## 2021-10-30 ENCOUNTER — Other Ambulatory Visit: Payer: Self-pay

## 2021-10-30 ENCOUNTER — Ambulatory Visit: Payer: Self-pay | Admitting: Physician Assistant

## 2021-10-30 ENCOUNTER — Encounter: Payer: Self-pay | Admitting: Physician Assistant

## 2021-10-30 ENCOUNTER — Other Ambulatory Visit (HOSPITAL_COMMUNITY)
Admission: RE | Admit: 2021-10-30 | Discharge: 2021-10-30 | Disposition: A | Discharge status: Home / Self Care | Payer: Self-pay | Source: Ambulatory Visit | Attending: Physician Assistant | Admitting: Physician Assistant

## 2021-10-30 VITALS — BP 169/92 | HR 71 | Temp 97.8°F | Wt 128.0 lb

## 2021-10-30 DIAGNOSIS — I10 Essential (primary) hypertension: Secondary | ICD-10-CM | POA: Insufficient documentation

## 2021-10-30 DIAGNOSIS — E039 Hypothyroidism, unspecified: Secondary | ICD-10-CM

## 2021-10-30 DIAGNOSIS — M79642 Pain in left hand: Secondary | ICD-10-CM

## 2021-10-30 LAB — BASIC METABOLIC PANEL
Anion gap: 5 (ref 5–15)
BUN: 13 mg/dL (ref 8–23)
CO2: 26 mmol/L (ref 22–32)
Calcium: 8.9 mg/dL (ref 8.9–10.3)
Chloride: 103 mmol/L (ref 98–111)
Creatinine, Ser: 0.77 mg/dL (ref 0.44–1.00)
GFR, Estimated: >60 mL/min (ref >60)
Glucose, Bld: 91 mg/dL (ref 70–99)
Potassium: 4.2 mmol/L (ref 3.5–5.1)
Sodium: 134 mmol/L — ABNORMAL LOW (ref 135–145)

## 2021-10-30 LAB — TSH: TSH: 3.97 u[IU]/mL (ref 0.350–4.500)

## 2021-10-30 MED ORDER — AMLODIPINE BESYLATE 5 MG PO TABS: 10.0000 mg | ORAL_TABLET | Freq: Every day | ORAL | 1 refills | Status: DC

## 2021-10-30 NOTE — Progress Notes (Signed)
BP (!) 169/92    Pulse 71    Temp 97.8 F (36.6 C)    Wt 128 lb (58.1 kg)    SpO2 96%    BMI 25.00 kg/m    Subjective:    Patient ID: Stephanie Howe, female    DOB: October 22, 1960, 61 y.o.   MRN: [id number]  HPI: Stephanie Howe is a 61 y.o. female presenting on 10/30/2021 for Hypertension and Hypothyroidism   HPI   Chief Complaint  Patient presents with   Hypertension   Hypothyroidism    Pt c/o left hand sore and Left arm aching for 2 day.  She is unaware of injury.  She Works at [employer]  She just got her labs drawn just prior to her appointment.  She feels like she has problems with her memory.    Pt admits that she doesn't take her medication every day.      Relevant past medical, surgical, family and social history reviewed and updated as indicated. Interim medical history since our last visit reviewed. Allergies and medications reviewed and updated.   Current Outpatient Medications:    amLODipine (NORVASC) 5 MG tablet, Take 1 tablet (5 mg total) by mouth daily., Disp: 90 tablet, Rfl: 1   aspirin EC 81 MG tablet, Take 81 mg by mouth daily. Swallow whole., Disp: , Rfl:    levothyroxine (SYNTHROID) 50 MCG tablet, TAKE ONE AND ONE-HALF TABLET BY MOUTH ON MONDAY, WEDNESDAY, AND FRIDAY. TAKE ONE TABLET ON SUNDAY, TUESDAY, THURSDAY, AND SATURDAY, Disp: 135 tablet, Rfl: 1    Review of Systems  Per HPI unless specifically indicated above     Objective:    BP (!) 169/92    Pulse 71    Temp 97.8 F (36.6 C)    Wt 128 lb (58.1 kg)    SpO2 96%    BMI 25.00 kg/m   Wt Readings from Last 3 Encounters:  10/30/21 128 lb (58.1 kg)  08/08/21 124 lb (56.2 kg)  06/12/21 124 lb (56.2 kg)    Physical Exam Vitals reviewed.  Constitutional:      General: She is not in acute distress.    Appearance: She is well-developed. She is not ill-appearing.  HENT:     Head: Normocephalic and atraumatic.  Cardiovascular:     Rate and Rhythm: Normal rate and  regular rhythm.  Pulmonary:     Effort: Pulmonary effort is normal.     Breath sounds: Normal breath sounds.  Abdominal:     General: Bowel sounds are normal.     Palpations: Abdomen is soft. There is no mass.     Tenderness: There is no abdominal tenderness.  Musculoskeletal:     Left upper arm: Normal.     Left forearm: Normal.     Left wrist: Normal.     Right hand: Normal.     Left hand: Tenderness present. No swelling, deformity, lacerations or bony tenderness. Normal range of motion. Normal strength. Normal sensation. Normal capillary refill. Normal pulse.     Cervical back: Neck supple.     Right lower leg: No edema.     Left lower leg: No edema.     Comments: Mild tenderness of left thenar eminence  Lymphadenopathy:     Cervical: No cervical adenopathy.  Skin:    General: Skin is warm and dry.  Neurological:     Mental Status: She is alert and oriented to person, place, and time.  Psychiatric:  Behavior: Behavior normal.          Assessment & Plan:    Encounter Diagnoses  Name Primary?   Essential hypertension    Acquired hypothyroidism Yes   Left hand pain      -She does not want to participate in home bp monitoring program -Discussed research programs for memory and she declines, doesn't want to go to [place].  Encouraged her to keep her mind acitve with puzzles and games and things that use the brain. -Increase amlodipine.  Discussed with pt the importance of takingher medication EVERY day.  Encouraged her to set alarm if that will help her take her medication. -encouraged pt to ice her hand 10-20 minutes 3 or 4 times daily for next several day and use IBU or APAP prn -will Call pt results (pt requests leave voicemail) -pt to follow up 1 month to recheck bp.  She is to contact office sooner prn

## 2021-11-08 ENCOUNTER — Ambulatory Visit: Payer: Self-pay | Admitting: Physician Assistant

## 2021-11-26 ENCOUNTER — Encounter: Payer: Self-pay | Admitting: Physician Assistant

## 2021-11-26 ENCOUNTER — Ambulatory Visit: Payer: Self-pay | Admitting: Physician Assistant

## 2021-11-26 VITALS — BP 145/80 | HR 91 | Temp 98.2°F | Wt 130.0 lb

## 2021-11-26 DIAGNOSIS — R9431 Abnormal electrocardiogram [ECG] [EKG]: Secondary | ICD-10-CM

## 2021-11-26 DIAGNOSIS — I493 Ventricular premature depolarization: Secondary | ICD-10-CM

## 2021-11-26 DIAGNOSIS — I10 Essential (primary) hypertension: Secondary | ICD-10-CM

## 2021-11-26 DIAGNOSIS — E039 Hypothyroidism, unspecified: Secondary | ICD-10-CM

## 2021-11-26 MED ORDER — METOPROLOL TARTRATE 50 MG PO TABS: 50.0000 mg | ORAL_TABLET | Freq: Two times a day (BID) | ORAL | 0 refills | Status: DC

## 2021-11-26 MED ORDER — LEVOTHYROXINE SODIUM 50 MCG PO TABS: ORAL_TABLET | ORAL | 0 refills | Status: DC

## 2021-11-26 MED ORDER — AMLODIPINE BESYLATE 10 MG PO TABS: 10.0000 mg | ORAL_TABLET | Freq: Every day | ORAL | 0 refills | Status: DC

## 2021-11-26 NOTE — Progress Notes (Signed)
? ?BP (!) 145/80   Pulse 91   Temp 98.2 ?F (36.8 ?C)   Wt 130 lb (59 kg)   SpO2 93%   BMI 25.39 kg/m?   ? ?Subjective:  ? ? Patient ID: Stephanie Howe, female    DOB: Nov 08, 1960, 61 y.o.   MRN: [id number] ? ?HPI: ?Stephanie Howe is a 61 y.o. female presenting on 11/26/2021 for Hypertension ? ? ?HPI ? ?Chief Complaint  ?Patient presents with  ? Hypertension  ? ? ? ?She checks her bp at home but didn't bring her log.  She says it's been up and down and all over the place ? ?She has not gotten the covid vacciantion ? ?She says she gets CP when she gets short-winded.  That comes some times when she exerts herself.  She says that hasn't changed in the past 5 or 10 years.   ? ?She drinks a lot of caffeine- 2 or 3 cups of iced tea daily.  She does Not drink sodas.   She drinks 1 cup coffee daily.   ? ?She had a Normal coronary CT in 2018 ? ?She is unaware of feelings of palpitations. ? ? ? ? ?Relevant past medical, surgical, family and social history reviewed and updated as indicated. Interim medical history since our last visit reviewed. ?Allergies and medications reviewed and updated. ? ? ?Current Outpatient Medications:  ?  amLODipine (NORVASC) 5 MG tablet, Take 2 tablets (10 mg total) by mouth daily., Disp: 90 tablet, Rfl: 1 ?  aspirin EC 81 MG tablet, Take 81 mg by mouth daily. Swallow whole., Disp: , Rfl:  ?  levothyroxine (SYNTHROID) 50 MCG tablet, TAKE ONE AND ONE-HALF TABLET BY MOUTH ON MONDAY, WEDNESDAY, AND FRIDAY. TAKE ONE TABLET ON SUNDAY, TUESDAY, THURSDAY, AND SATURDAY, Disp: 135 tablet, Rfl: 1 ? ? ? ? ?Review of Systems ? ?Per HPI unless specifically indicated above ? ?   ?Objective:  ?  ?BP (!) 145/80   Pulse 91   Temp 98.2 ?F (36.8 ?C)   Wt 130 lb (59 kg)   SpO2 93%   BMI 25.39 kg/m?   ?Wt Readings from Last 3 Encounters:  ?11/26/21 130 lb (59 kg)  ?10/30/21 128 lb (58.1 kg)  ?08/08/21 124 lb (56.2 kg)  ?  ?Physical Exam ?Vitals reviewed.  ?Constitutional:   ?   General: She is not in acute  distress. ?   Appearance: She is well-developed. She is not toxic-appearing.  ?HENT:  ?   Head: Normocephalic and atraumatic.  ?Cardiovascular:  ?   Rate and Rhythm: Normal rate. Rhythm irregular.  ?Pulmonary:  ?   Effort: Pulmonary effort is normal.  ?   Breath sounds: Normal breath sounds.  ?Abdominal:  ?   General: Bowel sounds are normal.  ?   Palpations: Abdomen is soft. There is no mass.  ?   Tenderness: There is no abdominal tenderness.  ?Musculoskeletal:  ?   Cervical back: Neck supple.  ?   Right lower leg: No edema.  ?   Left lower leg: No edema.  ?Lymphadenopathy:  ?   Cervical: No cervical adenopathy.  ?Skin: ?   General: Skin is warm and dry.  ?Neurological:  ?   Mental Status: She is alert and oriented to person, place, and time.  ?Psychiatric:     ?   Behavior: Behavior normal.  ? ? ? ? ?EKG- sinus rhythm at 88 bpm.  Multifocal PVCs .  EKG changes compared with EKG dated 11/20/16.  No apparent acute st-t changes.   ? ? ?   ?Assessment & Plan:  ? ? ? ?Encounter Diagnoses  ?Name Primary?  ? Essential hypertension Yes  ? Acquired hypothyroidism   ? Multifocal PVCs   ? Abnormal EKG   ? ? ? ?-will Add low dose beta blocker and recommended pt avoid caffeine (although pt says she can't go without) ?-Refer to cardiology for further E&M.  Pt was very indecisive when asked if she was in agreement with referral.  She was told that since she is uncertain, she can think on it longer and has the option of scheduling or declining when called by cardiology to schedule appointment ?-pt was given application for [employer] financial assistance/cafa ?-pt was encouraged to get covid vaccination ?-pt to follow up 1 month.  She is to contact office sooner for changes, worsening or new symptoms ? ? ? ?

## 2021-11-26 NOTE — Patient Instructions (Signed)
COVID-19 vaccination significantly lowers your risk of severe illness, hospitalization, and death if you get infected. Compared to people who are up to date with their COVID-19 vaccinations, unvaccinated people aremore likely to get COVID-19, much more likely to be hospitalized with COVID-19, and much more likely to die from COVID-19. ?Like all vaccines, COVID-19 vaccines are not 100% effective at preventing infection. Some people who are up to date with their COVID-19 vaccinations will get COVID-19 breakthrough infection. However, staying up to date with your COVID-19 vaccinations means that you are less likely to have a breakthrough infection and, if you do get sick, you are less likely to get severely ill or die. Staying up to date with COVID-19 vaccination also means you are less likely to spread the disease to others and increases your protection against new variants of SARS-CoV-2, the virus that causes COVID-19. ? ?

## 2021-12-04 NOTE — Congregational Nurse Program (Signed)
Pt returned on today to submit docs needed to process her [employer] application as it relates to an upcoming referral for a cardiologist as she states was recommended by her PCP ([facility]) ? ? ?Plan ?All docs successfully collected and forwarded to Diane B. [facility] program manager for review and  to be sent to [employer] dept. ? ?

## 2021-12-24 ENCOUNTER — Encounter: Payer: Self-pay | Admitting: Physician Assistant

## 2021-12-24 ENCOUNTER — Ambulatory Visit: Payer: Self-pay | Admitting: Physician Assistant

## 2021-12-24 VITALS — BP 111/72 | HR 67 | Temp 98.4°F | Wt 130.0 lb

## 2021-12-24 DIAGNOSIS — I10 Essential (primary) hypertension: Secondary | ICD-10-CM

## 2021-12-24 DIAGNOSIS — R21 Rash and other nonspecific skin eruption: Secondary | ICD-10-CM

## 2021-12-24 DIAGNOSIS — I493 Ventricular premature depolarization: Secondary | ICD-10-CM

## 2021-12-24 MED ORDER — MOMETASONE FUROATE 0.1 % EX CREA: TOPICAL_CREAM | CUTANEOUS | 1 refills | Status: DC

## 2021-12-24 NOTE — Progress Notes (Signed)
? ?BP 111/72   Pulse 67   Temp 98.4 ?F (36.9 ?C)   Wt 130 lb (59 kg)   SpO2 98%   BMI 25.39 kg/m?   ? ?Subjective:  ? ? Patient ID: Stephanie Howe, female    DOB: September 30, 1960, 61 y.o.   MRN: [id number] ? ?HPI: ?WINDELL MELOTT is a 61 y.o. female presenting on 12/24/2021 for Hypertension ? ? ?HPI ? ? ?Chief Complaint  ?Patient presents with  ? Hypertension  ? ? ?She says she doesn't feel any different than before she was on the metoprolol that was started at appointment one month ago. ? ?She does not have appointment with cardiology.  She says she was called but didn't scheduled because they told her it was going to be a lot of money.  She says she submitted cafa but hasn't heard if she was approved.   ? ?Pt requests cream for rash on LLE.   ? ? ?Relevant past medical, surgical, family and social history reviewed and updated as indicated. Interim medical history since our last visit reviewed. ?Allergies and medications reviewed and updated. ? ? ? ?Current Outpatient Medications:  ?  amLODipine (NORVASC) 10 MG tablet, Take 1 tablet (10 mg total) by mouth daily., Disp: 90 tablet, Rfl: 0 ?  aspirin EC 81 MG tablet, Take 81 mg by mouth daily. Swallow whole., Disp: , Rfl:  ?  hydrocortisone cream 1 %, Apply 1 application. topically 2 (two) times daily., Disp: , Rfl:  ?  levothyroxine (SYNTHROID) 50 MCG tablet, TAKE ONE AND ONE-HALF TABLET BY MOUTH ON MONDAY, WEDNESDAY, AND FRIDAY. TAKE ONE TABLET ON SUNDAY, TUESDAY, THURSDAY, AND SATURDAY, Disp: 135 tablet, Rfl: 0 ?  metoprolol tartrate (LOPRESSOR) 50 MG tablet, Take 1 tablet (50 mg total) by mouth 2 (two) times daily., Disp: 180 tablet, Rfl: 0  ? ? ?Review of Systems ? ?Per HPI unless specifically indicated above ? ?   ?Objective:  ?  ?BP 111/72   Pulse 67   Temp 98.4 ?F (36.9 ?C)   Wt 130 lb (59 kg)   SpO2 98%   BMI 25.39 kg/m?   ?Wt Readings from Last 3 Encounters:  ?12/24/21 130 lb (59 kg)  ?11/26/21 130 lb (59 kg)  ?10/30/21 128 lb (58.1 kg)  ?  ?Physical  Exam ?Vitals reviewed.  ?Constitutional:   ?   Appearance: She is well-developed.  ?HENT:  ?   Head: Normocephalic and atraumatic.  ?Cardiovascular:  ?   Rate and Rhythm: Normal rate. Rhythm irregular.  ?Pulmonary:  ?   Effort: Pulmonary effort is normal.  ?   Breath sounds: Normal breath sounds.  ?Musculoskeletal:  ?   Cervical back: Neck supple.  ?Lymphadenopathy:  ?   Cervical: No cervical adenopathy.  ?Skin: ?   General: Skin is warm and dry.  ?   Comments: Two little patches of dry reddish rash on LLE.  No discrete lesions.  Rash does not blanche. No tenderness.  No purulence.    ?Neurological:  ?   Mental Status: She is alert and oriented to person, place, and time.  ?Psychiatric:     ?   Behavior: Behavior normal.  ? ? ? ? ?   ?Assessment & Plan:  ? ? ?Encounter Diagnoses  ?Name Primary?  ? Essential hypertension Yes  ? Multifocal PVCs   ? Rash   ? ? ? ? ? ?-pt to continue current medications ?-will Check on status of cafa and notify pt ?-Not yet scheduled  for cardiology yet for multifocal PVCs ?-rx elocon for LLE rash ?-encouraged pt to get covid vaccination ?-pt to follow up 3 months.  She is to contact office sooner prn ?

## 2021-12-24 NOTE — Patient Instructions (Signed)
COVID-19 vaccination significantly lowers your risk of severe illness, hospitalization, and death if you get infected. Compared to people who are up to date with their COVID-19 vaccinations, unvaccinated people aremore likely to get COVID-19, much more likely to be hospitalized with COVID-19, and much more likely to die from COVID-19. ?Like all vaccines, COVID-19 vaccines are not 100% effective at preventing infection. Some people who are up to date with their COVID-19 vaccinations will get COVID-19 breakthrough infection. However, staying up to date with your COVID-19 vaccinations means that you are less likely to have a breakthrough infection and, if you do get sick, you are less likely to get severely ill or die. Staying up to date with COVID-19 vaccination also means you are less likely to spread the disease to others and increases your protection against new variants of SARS-CoV-2, the virus that causes COVID-19. ? ?

## 2021-12-25 NOTE — Congregational Nurse Program (Signed)
Pt was assisted with obtaining verification of her CAFA approval information, per her request for the cardiologist specialist per their request. ? ?During her visit at [facility] she also shopped at the on-site food market, to obtain food for her family of 2.     She also selected some heart healthy, low sodium and fruits and vegetables to assist with management of her  hypertension and heart medical condition.  ?

## 2022-02-25 ENCOUNTER — Encounter: Payer: Self-pay | Admitting: Cardiology

## 2022-02-25 ENCOUNTER — Ambulatory Visit (INDEPENDENT_AMBULATORY_CARE_PROVIDER_SITE_OTHER): Payer: Self-pay | Admitting: Cardiology

## 2022-02-25 VITALS — BP 136/80 | HR 73 | Ht 61.0 in | Wt 130.6 lb

## 2022-02-25 DIAGNOSIS — I10 Essential (primary) hypertension: Secondary | ICD-10-CM

## 2022-02-25 DIAGNOSIS — I493 Ventricular premature depolarization: Secondary | ICD-10-CM

## 2022-02-25 DIAGNOSIS — R0602 Shortness of breath: Secondary | ICD-10-CM

## 2022-02-25 DIAGNOSIS — R9431 Abnormal electrocardiogram [ECG] [EKG]: Secondary | ICD-10-CM

## 2022-02-25 DIAGNOSIS — R413 Other amnesia: Secondary | ICD-10-CM

## 2022-02-25 NOTE — Assessment & Plan Note (Signed)
Personally reviewed on prior EKG from 11/26/2021.  Checking echocardiogram.  No high risk symptoms such as syncope.  No chest pain.  She has been experiencing shortness of breath however.  We will make sure that there is no evidence of cardiomyopathy on echocardiogram.  Prior CT scan of coronary in 2018 showed no CAD.

## 2022-02-25 NOTE — Assessment & Plan Note (Signed)
Multifocal PVCs noted.  Checking echocardiogram.  I am comfortable with her continuing with her beta-blocker for suppression, metoprolol 50 mg twice a day.

## 2022-02-25 NOTE — Progress Notes (Signed)
Cardiology Office Note:    Date:  02/25/2022   ID:  Stephanie Howe, DOB 05/12/1961, MRN [id number]  PCP:  Jacquelin Hawking, PA-C   [facility] Providers Cardiologist:  None     Referring MD: Jacquelin Hawking, PA-C    History of Present Illness:    Stephanie Howe is a 61 y.o. female here for the evaluation of multifocal PVCs at the request of Jacquelin Hawking, [place].  In review of prior office note from primary care, has had an EKG which showed multifocal PVCs.  She may have told him that she was experiencing palpitations previously.  Today, she states that she really is not feeling any skipped heartbeats.  She does have shortness of breath with activity.  Was placed on metoprolol but did not feel any different.  Which she was on 50 mg.  EKG personally reviewed shows multifocal PVCs, at least 2 different morphologies.  With sinus rhythm heart rate 88 bpm and nonspecific ST-T wave changes.  LDL cholesterol 115, electrolytes normal, hemoglobin 13.0 TSH 3.9.  Coronary CT scan performed in 2018 showed no obvious CAD.  Small caliber LAD noted  Her daughter is with her today.  She has been concerned about her memory loss.  For instance, she will state the same thing maybe 6 times over the hour.  She forgets things pretty rapidly.  She is lost her phone recently.    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   Qualifier: Diagnosis of  By: Romeo Apple MD, [place]     Heartburn 07/03/2016   Hx of iron deficiency anemia 11/15/2016   Hypertension    Hypothyroidism    Overweight    Vitamin D deficiency disease     Past Surgical History:  Procedure Laterality Date   ABDOMINAL HYSTERECTOMY  1994   BREAST EXCISIONAL BIOPSY Left 10+yrs ago   neg   COLON SURGERY  2002   colon exploratory surgery   TONSILLECTOMY  1984    Current Medications: Current Meds  Medication Sig    amLODipine (NORVASC) 10 MG tablet Take 1 tablet (10 mg total) by mouth daily.   aspirin EC 81 MG tablet Take 81 mg by mouth daily. Swallow whole.   levothyroxine (SYNTHROID) 50 MCG tablet TAKE ONE AND ONE-HALF TABLET BY MOUTH ON MONDAY, WEDNESDAY, AND FRIDAY. TAKE ONE TABLET ON SUNDAY, TUESDAY, THURSDAY, AND SATURDAY   metoprolol tartrate (LOPRESSOR) 50 MG tablet Take 1 tablet (50 mg total) by mouth 2 (two) times daily.   mometasone (ELOCON) 0.1 % cream Apply thin film to affected area bid     Allergies:   Codeine and Penicillins   Social History   Socioeconomic History   Marital status: Single    Spouse name: Not on file   Number of children: Not on file   Years of education: Not on file   Highest education level: Not on file  Occupational History   Not on file  Tobacco Use   Smoking status: Former    Packs/day: 1.50    Years: 30.00    Pack years: 45.00    Types: Cigarettes    Quit date: 09/24/2011    Years since quitting: 10.4   Smokeless tobacco: Never  Vaping Use   Vaping Use: Never used  Substance and Sexual Activity   Alcohol use: Never   Drug use: Never   Sexual activity: Not  Currently    Birth control/protection: Post-menopausal  Other Topics Concern   Not on file  Social History Narrative   Not on file   Social Determinants of Health   Financial Resource Strain: Not on file  Food Insecurity: Not on file  Transportation Needs: Not on file  Physical Activity: Not on file  Stress: Not on file  Social Connections: Not on file     Family History: The patient's family history includes COPD in her father and mother; Diabetes in her father; Hypertension in her brother and mother; Pulmonary embolism in her father; Stroke in her maternal grandmother. There is no history of Cancer, Heart disease, or Breast cancer.  ROS:   Please see the history of present illness.     All other systems reviewed and are negative.  EKGs/Labs/Other Studies Reviewed:    The  following studies were reviewed today: CT coronary 2018-no CAD  EKG: As described above  Recent Labs: 05/10/2021: ALT 12 10/30/2021: BUN 13; Creatinine, Ser 0.77; Potassium 4.2; Sodium 134; TSH 3.970  Recent Lipid Panel    Component Value Date/Time   CHOL 216 (H) 05/10/2021 1127   TRIG 89 05/10/2021 1127   HDL 83 05/10/2021 1127   CHOLHDL 2.6 05/10/2021 1127   VLDL 18 05/10/2021 1127   LDLCALC 115 (H) 05/10/2021 1127     Risk Assessment/Calculations:              Physical Exam:    VS:  BP 136/80   Pulse 73   Ht 5\' 1"  (1.549 m)   Wt 130 lb 9.6 oz (59.2 kg)   SpO2 94%   BMI 24.68 kg/m     Wt Readings from Last 3 Encounters:  02/25/22 130 lb 9.6 oz (59.2 kg)  12/24/21 130 lb (59 kg)  11/26/21 130 lb (59 kg)     GEN:  Well nourished, well developed in no acute distress HEENT: Normal NECK: No JVD; No carotid bruits LYMPHATICS: No lymphadenopathy CARDIAC: RRR, no murmurs, no rubs, gallops, isolated ectopy RESPIRATORY: Mostly clear with subtle wheeze that dissipated upon deep breathing. ABDOMEN: Soft, non-tender, non-distended MUSCULOSKELETAL:  No edema; No deformity  SKIN: Warm and dry NEUROLOGIC:  Alert and oriented x 3 PSYCHIATRIC:  Normal affect   ASSESSMENT:    1. Memory loss   2. Nonspecific abnormal electrocardiogram (ECG) (EKG)   3. SOB (shortness of breath)   4. Multifocal PVCs   5. Essential hypertension   6. Memory impairment    PLAN:    In order of problems listed above:  Nonspecific abnormal electrocardiogram (ECG) (EKG) Multifocal PVCs noted.  Checking echocardiogram.  I am comfortable with her continuing with her beta-blocker for suppression, metoprolol 50 mg twice a day.  Multifocal PVCs Personally reviewed on prior EKG from 11/26/2021.  Checking echocardiogram.  No high risk symptoms such as syncope.  No chest pain.  She has been experiencing shortness of breath however.  We will make sure that there is no evidence of cardiomyopathy on  echocardiogram.  Prior CT scan of coronary in 2018 showed no CAD.  Essential hypertension Currently well controlled on amlodipine 10 mg and metoprolol 50 mg twice a day.  Memory impairment This has been troublesome to her and progressive.  Her daughter mentioned as well.  We will assist with referral to neurology for further evaluation.         Medication Adjustments/Labs and Tests Ordered: Current medicines are reviewed at length with the patient today.  Concerns regarding medicines are  outlined above.  Orders Placed This Encounter  Procedures   Ambulatory referral to Neurology   ECHOCARDIOGRAM COMPLETE   No orders of the defined types were placed in this encounter.   Patient Instructions  Medication Instructions:  Your physician recommends that you continue on your current medications as directed. Please refer to the Current Medication list given to you today.   Labwork: None  Testing/Procedures: Your physician has requested that you have an echocardiogram. Echocardiography is a painless test that uses sound waves to create images of your heart. It provides your doctor with information about the size and shape of your heart and how well your heart's chambers and valves are working. This procedure takes approximately one hour. There are no restrictions for this procedure.   Follow-Up: Follow up with Dr. Anne FuSkains as needed.  Any Other Special Instructions Will Be Listed Below (If Applicable).   You have been referred to Neurology. They will give you a call with your first appointment.   If you need a refill on your cardiac medications before your next appointment, please call your pharmacy.    Signed, Donato SchultzMark Geneal Huebert, MD  02/25/2022 2:29 PM    [facility]

## 2022-02-25 NOTE — Assessment & Plan Note (Signed)
This has been troublesome to her and progressive.  Her daughter mentioned as well.  We will assist with referral to neurology for further evaluation.

## 2022-02-25 NOTE — Assessment & Plan Note (Signed)
Currently well controlled on amlodipine 10 mg and metoprolol 50 mg twice a day.

## 2022-02-25 NOTE — Patient Instructions (Addendum)
Medication Instructions:  Your physician recommends that you continue on your current medications as directed. Please refer to the Current Medication list given to you today.   Labwork: None  Testing/Procedures: Your physician has requested that you have an echocardiogram. Echocardiography is a painless test that uses sound waves to create images of your heart. It provides your doctor with information about the size and shape of your heart and how well your heart's chambers and valves are working. This procedure takes approximately one hour. There are no restrictions for this procedure.   Follow-Up: Follow up with Dr. Anne Fu as needed.  Any Other Special Instructions Will Be Listed Below (If Applicable).   You have been referred to Neurology. They will give you a call with your first appointment.   If you need a refill on your cardiac medications before your next appointment, please call your pharmacy.

## 2022-03-06 ENCOUNTER — Ambulatory Visit (HOSPITAL_COMMUNITY)
Admission: RE | Admit: 2022-03-06 | Discharge: 2022-03-06 | Disposition: A | Discharge status: Home / Self Care | Payer: Self-pay | Source: Ambulatory Visit | Attending: Cardiology | Admitting: Cardiology

## 2022-03-06 DIAGNOSIS — R0602 Shortness of breath: Secondary | ICD-10-CM | POA: Insufficient documentation

## 2022-03-06 DIAGNOSIS — R9431 Abnormal electrocardiogram [ECG] [EKG]: Secondary | ICD-10-CM | POA: Insufficient documentation

## 2022-03-06 LAB — ECHOCARDIOGRAM COMPLETE
Area-P 1/2: 2.39 cm2
MV M vel: 5 m/s
MV Peak grad: 100 mmHg
S' Lateral: 1.65 cm

## 2022-03-06 NOTE — Progress Notes (Signed)
*  PRELIMINARY RESULTS* Echocardiogram 2D Echocardiogram has been performed.  Stacey Drain 03/06/2022, 11:09 AM

## 2022-03-11 ENCOUNTER — Other Ambulatory Visit: Payer: Self-pay | Admitting: Physician Assistant

## 2022-03-11 DIAGNOSIS — E039 Hypothyroidism, unspecified: Secondary | ICD-10-CM

## 2022-03-12 ENCOUNTER — Other Ambulatory Visit: Payer: Self-pay | Admitting: Physician Assistant

## 2022-03-12 DIAGNOSIS — E039 Hypothyroidism, unspecified: Secondary | ICD-10-CM

## 2022-03-12 DIAGNOSIS — I10 Essential (primary) hypertension: Secondary | ICD-10-CM

## 2022-03-15 ENCOUNTER — Telehealth: Payer: Self-pay

## 2022-03-25 ENCOUNTER — Ambulatory Visit: Payer: Self-pay | Admitting: Physician Assistant

## 2022-03-27 ENCOUNTER — Other Ambulatory Visit: Payer: Self-pay | Admitting: Physician Assistant

## 2022-03-27 ENCOUNTER — Ambulatory Visit: Payer: Self-pay | Admitting: Physician Assistant

## 2022-03-27 DIAGNOSIS — E785 Hyperlipidemia, unspecified: Secondary | ICD-10-CM

## 2022-03-27 DIAGNOSIS — I10 Essential (primary) hypertension: Secondary | ICD-10-CM

## 2022-03-27 DIAGNOSIS — E039 Hypothyroidism, unspecified: Secondary | ICD-10-CM

## 2022-04-02 NOTE — Progress Notes (Unsigned)
[facility]  PATIENT: Stephanie Howe DOB: April 28, 1961  REFERRING CLINICIAN: Jake Bathe, MD HISTORY FROM: self, daughter REASON FOR VISIT: memory loss   HISTORICAL  CHIEF COMPLAINT:  Chief Complaint  Patient presents with   New Patient (Initial Visit)    Pt daughter states that her memory has gotten progressively worse. Daughter asking for preventive solutions. Pt daughter states that this morning her mother asked "where did make her coffee" that she never made 6 times this morning. Room 1 alone     HISTORY OF PRESENT ILLNESS:  The patient presents for evaluation of memory loss which has been present over the past year. It has been progressively worsening over time. She repeats questions multiple times. For example, this morning she asked where her coffee was 6 times, but she never made coffee this morning. Will get confused about where she is. Has never gotten lost while driving. Will get dates mixed up and will forget to take her medicine.  TBI:  No past history of TBI Stroke:  no past history of stroke Seizures:  no past history of seizures Sleep: Wakes up frequently during the night. Never been diagnosed with sleep apnea and does not snore Mood: She has lost interest in doing things lately  Functional status:  Patient lives with her sister Cooking: struggles to remember how to cook things she used to Cleaning: will sometimes misplace objects Shopping: no issues Driving: no issues, has not gotten lost Bills: no issues Medications: Forgets to take her medications. Sister helps her Forgetting loved ones names?: no, forgets acquaintances names Word finding difficulty? yes  OTHER MEDICAL CONDITIONS: HTN, aortic atherosclerosis, hypothyroidism, HLD   REVIEW OF SYSTEMS: Full 14 system review of systems performed and negative with exception of: memory loss  ALLERGIES: Allergies  Allergen Reactions   Codeine    Penicillins     HOME  MEDICATIONS: Outpatient Medications Prior to Visit  Medication Sig Dispense Refill   amLODipine (NORVASC) 10 MG tablet TAKE 1 Tablet BY MOUTH ONCE EVERY DAY 90 tablet 0   aspirin EC 81 MG tablet Take 81 mg by mouth daily. Swallow whole.     levothyroxine (SYNTHROID) 50 MCG tablet TAKE 1 & 1/2 Tablets BY MOUTH ONCE EVERY DAY ON MONDAY, WEDNESDAY AND FRIDAY AND TAKE 1 Tablet BY MOUTH ONCE EVERY DAY ON SUNDAY, TUESDAY, THURSDAY, AND SATURDAY 135 tablet 0   metoprolol tartrate (LOPRESSOR) 50 MG tablet TAKE 1 Tablet  BY MOUTH TWICE DAILY 180 tablet 0   mometasone (ELOCON) 0.1 % cream Apply thin film to affected area bid 15 g 1   No facility-administered medications prior to visit.    PAST MEDICAL HISTORY: 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   Qualifier: Diagnosis of  By: Romeo Apple MD, [place]     Heartburn 07/03/2016   Hx of iron deficiency anemia 11/15/2016   Hypertension    Hypothyroidism    Overweight    Vitamin D deficiency disease     PAST SURGICAL HISTORY: Past Surgical History:  Procedure Laterality Date   ABDOMINAL HYSTERECTOMY  1994   BREAST EXCISIONAL BIOPSY Left 10+yrs ago   neg   COLON SURGERY  2002   colon exploratory surgery   TONSILLECTOMY  1984    FAMILY HISTORY: Family History  Problem Relation Age of Onset   COPD Mother    Hypertension Mother  Pulmonary embolism Father    Diabetes Father    COPD Father    Hypertension Brother    Stroke Maternal Grandmother    Cancer Neg Hx    Heart disease Neg Hx    Breast cancer Neg Hx     SOCIAL HISTORY: Social History   Socioeconomic History   Marital status: Single    Spouse name: Not on file   Number of children: Not on file   Years of education: Not on file   Highest education level: Not on file  Occupational History   Not on file  Tobacco Use   Smoking status: Former    Packs/day: 1.50     Years: 30.00    Total pack years: 45.00    Types: Cigarettes    Quit date: 09/24/2011    Years since quitting: 10.5   Smokeless tobacco: Never  Vaping Use   Vaping Use: Never used  Substance and Sexual Activity   Alcohol use: Never   Drug use: Never   Sexual activity: Not Currently    Birth control/protection: Post-menopausal  Other Topics Concern   Not on file  Social History Narrative   Right handed   Caffeine intake 64 oz of tea daily   Social Determinants of Health   Financial Resource Strain: Not on file  Food Insecurity: Not on file  Transportation Needs: Not on file  Physical Activity: Not on file  Stress: Not on file  Social Connections: Not on file  Intimate Partner Violence: Not on file     PHYSICAL EXAM   GENERAL EXAM/CONSTITUTIONAL: Vitals:  Vitals:   04/03/22 1018  Weight: 152 lb (68.9 kg)  Height: 5\' 1"  (1.549 m)   Body mass index is 28.72 kg/m. Wt Readings from Last 3 Encounters:  04/03/22 152 lb (68.9 kg)  02/25/22 130 lb 9.6 oz (59.2 kg)  12/24/21 130 lb (59 kg)   NEUROLOGIC: MENTAL STATUS:     04/03/2022   10:20 AM  Montreal Cognitive Assessment   Visuospatial/ Executive (0/5) 0  Naming (0/3) 3  Attention: Read list of digits (0/2) 2  Attention: Read list of letters (0/1) 1  Attention: Serial 7 subtraction starting at 100 (0/3) 2  Language: Repeat phrase (0/2) 2  Language : Fluency (0/1) 1  Abstraction (0/2) 2  Delayed Recall (0/5) 0  Orientation (0/6) 4  Total 17   CRANIAL NERVE:  2nd, 3rd, 4th, 6th - pupils equal and reactive to light, visual fields full to confrontation, extraocular muscles intact, no nystagmus 5th - facial sensation symmetric 7th - facial strength symmetric 8th - hearing intact 9th - palate elevates symmetrically, uvula midline 11th - shoulder shrug symmetric 12th - tongue protrusion midline  MOTOR:  normal bulk and tone, no cogwheeling, full strength in the BUE, BLE  SENSORY:  normal and symmetric  to light touch all 4 extremities  COORDINATION:  finger-nose-finger, fine finger movements normal, no tremor  REFLEXES:  deep tendon reflexes present and symmetric  GAIT/STATION:  normal     DIAGNOSTIC DATA (LABS, IMAGING, TESTING) - I reviewed patient records, labs, notes, testing and imaging myself where available.  Lab Results  Component Value Date   WBC 8.8 07/04/2020   HGB 13.0 07/04/2020   HCT 40.8 07/04/2020   MCV 79.2 (L) 07/04/2020   PLT 396 07/04/2020      Component Value Date/Time   NA 134 (L) 10/30/2021 1049   NA 138 10/18/2015 0000   NA 136 11/30/2013 1251   K  4.2 10/30/2021 1049   K 3.8 11/30/2013 1251   CL 103 10/30/2021 1049   CL 103 11/30/2013 1251   CO2 26 10/30/2021 1049   CO2 26 11/30/2013 1251   GLUCOSE 91 10/30/2021 1049   GLUCOSE 80 11/30/2013 1251   BUN 13 10/30/2021 1049   BUN 1 (A) 10/18/2015 0000   BUN 8 11/30/2013 1251   CREATININE 0.77 10/30/2021 1049   CREATININE 0.81 07/03/2016 1153   CALCIUM 8.9 10/30/2021 1049   CALCIUM 9.1 11/30/2013 1251   PROT 7.3 05/10/2021 1127   PROT 7.4 11/30/2013 1251   ALBUMIN 4.5 05/10/2021 1127   ALBUMIN 3.8 11/30/2013 1251   AST 19 05/10/2021 1127   AST 19 11/30/2013 1251   ALT 12 05/10/2021 1127   ALT 17 11/30/2013 1251   ALKPHOS 57 05/10/2021 1127   ALKPHOS 93 11/30/2013 1251   BILITOT 0.7 05/10/2021 1127   BILITOT 0.3 11/30/2013 1251   GFRNONAA >60 10/30/2021 1049   GFRNONAA 81 07/03/2016 1153   GFRAA >60 05/18/2019 1752   GFRAA >89 07/03/2016 1153   Lab Results  Component Value Date   CHOL 216 (H) 05/10/2021   HDL 83 05/10/2021   LDLCALC 115 (H) 05/10/2021   TRIG 89 05/10/2021   CHOLHDL 2.6 05/10/2021   Lab Results  Component Value Date   HGBA1C 5.9 02/08/2015   No results found for: "VITAMINB12" Lab Results  Component Value Date   TSH 3.970 10/30/2021      ASSESSMENT AND PLAN  61 y.o. year old female with a history of HTN, aortic atherosclerosis, hypothyroidism, HLD  who presents for evaluation of worsening memory loss over the past year. This has begun to slightly affect her ADLs. MOCA score today is 17/30, which is suggestive of dementia. Will order brain MRI and check B12 level today. Discussed medication options including donepezil if no reversible cause for memory loss is found.   1. Memory loss       PLAN: - Labs: B12 - MRI brain - Next steps: consider donepezil if no reversible cause for memory loss is found  Orders Placed This Encounter  Procedures   MR BRAIN W WO CONTRAST   Vitamin B12    Return in about 6 months (around 10/04/2022).  I spent an average of 25 minutes chart reviewing and counseling the patient, with at least 50% of the time face to face with the patient. General brain health measures discussed, including the importance of regular aerobic exercise. Reviewed safety measures including driving safety.   Genia Harold, MD 04/03/22 12:46 PM [facility] [address], [place], [place] [address] [phone]

## 2022-04-03 ENCOUNTER — Ambulatory Visit (INDEPENDENT_AMBULATORY_CARE_PROVIDER_SITE_OTHER): Payer: Self-pay | Admitting: Psychiatry

## 2022-04-03 ENCOUNTER — Encounter: Payer: Self-pay | Admitting: Psychiatry

## 2022-04-03 VITALS — Ht 61.0 in | Wt 152.0 lb

## 2022-04-03 DIAGNOSIS — R413 Other amnesia: Secondary | ICD-10-CM

## 2022-04-03 NOTE — Patient Instructions (Addendum)
MRI of the brain Blood work - vitamin B12 level The memory test done today was called the MOCA: [email or web address]   DEMENTIA OVERVIEW "Dementia" is a general term for when a person has developed difficulties with reasoning, judgment, and memory. People who have dementia usually have some memory loss as well as difficulty in at least one other area, such as: ?Speaking or writing coherently (or understanding what is said or written) ?Recognizing familiar surroundings ?Planning and carrying out complex or multi-step tasks In order to be considered dementia these issues must be severe enough to interfere with a person's independence and daily activities. Dementia can be caused by several diseases that affect the brain. The most common cause is Alzheimer disease. Alzheimer disease is present in approximately 60 to 80 percent of all cases of dementia; other degenerative and/or vascular diseases may be present as well, particularly as a person gets older.   DEMENTIA RISK FACTORS There is no way to predict with certainty who will develop dementia. Each form of dementia has its own risk factors, but most forms have several risk factors in common. Age -- The biggest risk factor for dementia is age: dementia is rare in people younger than 60 years and becomes very common in people older than 31. For example, dementia affects approximately one in six people between 75 and 43 years old, one in three above 85 years, and almost half of people over age 72.  Family history -- Some forms of dementia have a genetic component, meaning that they tend to run in families. Having a close family member with Alzheimer disease increases your chances of developing it. People with a first-degree relative (parent or sibling) with Alzheimer disease have a greater chance of developing the disorder. The risk is probably highest if the family member developed Alzheimer disease at a younger age (less than 54 years old) and is lower if  the family member did not get Alzheimer disease until late in life. However, families that have a very strong genetic tendency toward Alzheimer disease are uncommon.  Other factors -- Studies indicate that high blood pressure, smoking, and diabetes may be risk factors for dementia. Experts are still not sure how treatment for these problems might influence your risk of developing dementia beyond their benefit of reducing stroke risk. Lifestyle factors have also been implicated in dementia. For instance, people who remain physically active, socially connected, and mentally engaged seem less likely to develop dementia than people who do not. These activities may produce more cognitive (mental) reserve or resilience, delaying the emergence of symptoms until an older age.  DEMENTIA SYMPTOMS Each form of dementia can cause difficulty with memory, language, reasoning, and judgment, but the symptoms are often very different from person to person. Symptoms also change over time. The differences between one form of dementia and another may only be recognizable to skilled health care providers who have experience working with people with dementia. Sometimes family members notice changes but mistakenly attribute them to aging.  Is memory loss normal? -- Many people worry that memory problems are related to early Alzheimer disease. However, some problems are normal and just related to aging, and do not signify a progressive dementia. Normal age-related changes often cause minor difficulties with immediate memory, for example, remembering a phone number or a set of directions for a short time. Temporary difficulty recalling proper names, even very familiar ones, is also common with aging. As people age normally, it is common to complain of less efficient and  slower processing and learning of new information. Memory changes due to normal aging are usually mild and do not worsen greatly over time, nor should they  interfere with a person's day-to-day functioning.  Early changes -- The earliest symptoms of Alzheimer disease are gradual and often subtle. Many people and their families first notice difficulty recalling recent events or information. This often emerges as a tendency to repeat stories or questions or to request or require repetition of material to be able to remember. If you find yourself telling an older family member or friend "I told you that earlier" or "You have told me that more than once," you might begin to suspect Alzheimer disease. Other changes can include one or more of the following: ?Difficulties with language (eg, not being able to find the right words for things) ?Difficulty with concentration and reasoning ?Problems with complex tasks like paying bills, cooking, or balancing a checkbook ?Getting lost in a familiar place  Late changes -- As Alzheimer disease progresses, a person's ability to think clearly continues to decline, and any or all of the changes listed above may be more disruptive. In addition, personality and behavioral symptoms can become quite troublesome. These can include: ?Increased anger or hostility, sometimes aggressive behavior; alternatively, some people become depressed or exhibit little interest in their surroundings (called "apathy") ?Sleep problems ?Hallucinations and/or delusions ?Disorientation ?Needing help with basic tasks (such as eating, bathing, and dressing) ?Incontinence (difficulty controlling the bladder and/or bowels)  The number of symptoms, the functions that are impaired, and the speed with which symptoms progress can vary widely from one person to the next. In some people, severe dementia occurs within five years of the diagnosis; for others, the progression can take more than 10 years. Most people with Alzheimer disease do not die from the disease itself, but rather from a secondary illness such as pneumonia, bladder infection, or  complications of a fall.  DEMENTIA DIAGNOSIS To diagnose dementia and identify the type of dementia, health care providers typically rely on the information they can gather by interacting with the person and speaking with their family members. The provider will typically perform memory and other cognitive (thinking) tests to assess the person's degree of difficulty with different types of problems. The results of these tests can then be monitored over time to observe whether functioning stays the same or declines.  Blood tests are usually done to find out if a chemical or hormonal imbalance or vitamin deficiency is contributing to the person's difficulties. Brain scans (usually MRI) are often performed in people with dementia to look for other problems. Sometimes the MRI can also help health care providers identify the type of dementia, since different types can have characteristic brain changes.  SAFETY AND LIFESTYLE ISSUES FOR PEOPLE WITH DEMENTIA A major issue for caregivers is making sure the person with dementia stays safe. Because many people with dementia do not realize that their mental functioning is impaired, they try to continue their day-to-day activities as usual. This can lead to physical danger, and caregivers must help to avoid situations that can threaten the safety of the person or others. The following information applies specifically to people with Alzheimer disease, but much of it is also relevant to people with other forms of dementia.  Medications -- People with Alzheimer disease often have trouble remembering to take medications they are prescribed for other conditions, or they become confused about which medications to take. They are also at increased risk for potentially dangerous side  effects from certain medications. Sedatives and certain other drugs (such as some antihistamines and antidepressants) may carry risk of increasing cognitive impairment. It is important to develop a  plan for medication monitoring and safety. People with dementia often need help taking their medications. It's a good idea to throw away old pill bottles and other medications that are no longer needed.  Driving -- Driving is often one of the first safety issues that arises in people with Alzheimer disease. In people with Alzheimer disease, the risk of having a car accident is significantly increased, especially as the disease progresses. It is best to discuss the issue of driving early, before the symptoms become advanced. Over time, everyone living long enough with dementia will reach a point where driving is too dangerous. Losing the ability to drive can be hard to accept because it represents independence for many people. It can also be challenging if the person does not completely appreciate their impairments in mental functioning or reaction time. In particular, driving at night may carry extra risk. Many people with mild but worrisome impairments will insist that they can safely drive locally or in the daytime. That may be true for the present time; however, people may forget that they have agreed to limitations. In addition, their ability to drive safely, even with restrictions, will deteriorate over time. A roadside driving test is often recommended if there is disagreement or uncertainty about a person's ability to drive. However, if a person with newly diagnosed, mild Alzheimer disease is deemed still able to drive, they will need to be reassessed every six months, with the understanding that driving will eventually no longer be possible. There may be important insurance implications of continued driving when medical records document advice to stop driving.  Cooking -- Cooking is another area that can lead to serious safety concerns and may require help or supervision. Symptoms such as distractibility, forgetfulness, and difficulty following directions can lead to burns, fires, or other injuries.  The use of gas cooking appliance raises a particular concern. A family member may have to ask the utility company to disconnect gas stoves if there is potential for accident or injury. Newer induction electric stoves do not change color when on, and may carry an inadvertent burn risk if a person forgets what they are doing.  Wandering -- As dementia progresses, some people with Alzheimer disease begin to wander. Because restlessness, distractibility, and memory problems are common, a person who wanders may easily become lost. Identification bracelets can help ensure that a lost wanderer gets home. The [employer] provides a "Wandering Support" program that provides ID tags and 24-hour assistance to patients who are preregistered for this program. There are many "locator" applications that allow the person with dementia to wear or carry a GPS device that a family member can track with their cell phone. Regular exercise may decrease the restlessness that can lead to wandering. Exercise is also just good practice to maintain strength, good sleep, and overall health. If wandering continues, wearable alarm systems are available that alert caretakers when the person leaves the home.  Falls -- Falls with secondary injuries are one of the most important causes of additional disability in people with all types of dementia, including Alzheimer disease. Commonly used medications can increase risk of falls and injuries. Hip fracture is a particular concern in older people, as it can lead to serious complications and sometimes even death. To reduce the risk of falls, potential tripping hazards such as loose  electrical cords, slippery rugs, and clutter should be removed. Inadvertent hoarding may develop and pose a safety risk. If the person lives alone, family members or elder services should perform safety inspections of the living space periodically. Medication lists should also be reviewed with your doctor to  identify those that might increase the risk of falling. Regular exercise, especially early in the course of dementia, and use of assistive devices like canes can also help with balance.  DEMENTIA TREATMENT Medications for Alzheimer disease -- Many people with Alzheimer disease will have the option of trying a medication. A trial of medication is usually begun for a period of a few to several weeks while the person is monitored for side effects and response. A health care provider should periodically review all medications to see if they are providing any benefit. It is important to have realistic expectations about the potential benefits of medication therapy in Alzheimer disease. None of these medications cure the disease, and the reality is that over time the person will continue to worsen. When medication does have an effect, the goal is not to stop progression of the disease, but to improve quality of life for the person and their family to the extent possible.  Treatment to slow or delay progression -- There is currently no cure for Alzheimer disease. However, experts are studying treatments in the hope of finding a way to slow the progression of the mental and functional decline, along with scientific efforts to prevent or delay onset.  Treatment of memory problems -- There are several medicines currently available for treating the memory problems associated with Alzheimer disease; they are also used in people with other forms of dementia.  ?Donepezil (brand name: Aricept) This medications allow more of a chemical called acetylcholine to be active in the brain, making up for drops in acetylcholine levels that happen in Alzheimer disease. It can cause side effects such as nausea, vomiting, and diarrhea in some people. It also may cause weight loss.  If there is no improvement in symptoms or side effects are bothersome, the medication should be stopped. Sometimes the person's symptoms will worsen  after treatment is stopped; if this happens, the medication may be started again.  Treatment of behavioral symptoms -- The behavioral symptoms of Alzheimer disease are often more troubling than the cognitive (mental) symptoms. Even in mild cases, agitation, anxiety, and irritability can occur, and generally worsen as Alzheimer disease advances. This can be stressful for the person as well as for their family and caregivers. A combination of medications and behavioral therapy may be helpful. Non-medication therapies are preferred, as virtually all medications used for behavioral symptoms can increase confusion and many are associated with serious side effects and even an increased risk of death.  Depression -- Depression is common, especially in the early phases of dementia. It may be treated with behavioral therapy and/or with medications. The key is to recognize that depression may be playing a role in the person's symptoms. If depression is causing distress, it is worth treating. Potentially helpful medicines include a group of medicines known as selective serotonin reuptake inhibitors, or SSRIs, which are usually preferred over other choices in patients with dementia. Widely used SSRIs include fluoxetine (brand name: Prozac), sertraline (brand name: Zoloft), paroxetine (sample brand names: Brisdelle, Paxil), citalopram (brand name: Celexa), and escitalopram (brand names: Lexapro, Cipralex).   A variety of behavioral therapies are often helpful, do not have the side effects often seen with medications, and may be recommended  for depression. Behavioral therapy involves changing the person's environment (eg, regular exercise, avoiding triggers that cause sadness, socializing with others, engaging in pleasant activities that a person enjoys).  Agitation and aggression -- One of the most difficult issues for caregivers and people with Alzheimer disease is aggressive behavior. Fortunately, this behavior is not  common. However, many family members are reluctant to report aggressive behavior. In some cases, the behavior becomes physically abusive as dementia progresses. Agitation and aggression can be caused by a number of factors, including: ?Confusion, misunderstanding, or disorientation (doctors use the term "delirium" as a general term to describe a state of confusion in which a person does not think or behave normally) ?Frightening or paranoid delusions or hallucinations ?Depression or anxiety ?Sleep disorders, such as reduced sleep or altered sleep/wake cycles ?Certain medical conditions that can cause delirium, such as urinary tract infection or pneumonia ?Being in physical pain or discomfort ?Side effects of certain medications Delusions (ie, believing something that is not real or true) are common in patients with dementia, occurring in up to 30 percent of those with advanced disease. Paranoid delusions are particularly distressing to both the patients and the caregivers: these often include beliefs that someone has invaded the house, that family members have been replaced by impostors, that spouses have been unfaithful, or that personal possessions have been stolen.   Family members should discuss any concerns about aggressive behavior with a health care provider and arrange for help if necessary. The best treatment for these symptoms depends upon what triggers them. As an example, a person who becomes aggressive during periods of confusion might best be treated by talking through the problem, while someone who becomes aggressive during delusions might require medication. Often, behavior improves once an underlying medical condition is treated. Caregivers can learn strategies to help lessen the number of triggers and confrontations.   Sleep problems -- Sleep disorders can be treated with either medicine or behavior changes or both: for example, limiting daytime naps, increasing physical activity,  avoiding caffeine and alcohol in the evening. In some people, medication to help with sleep may be recommended, although these medications almost always have side effects (eg, worsened confusion and increased risk of falls). Maintaining daily rhythms, using artificial lighting when needed during the day, and avoiding bright light exposures during the night may help maintain normal wake-sleep cycles.   COPING WITH DEMENTIA Being diagnosed with any form of dementia can be distressing and overwhelming for the person affected as well as their loved ones. For people with dementia -- It is important for people with early dementia to care for their physical and mental health. This means getting regular checkups, taking medicines if needed, eating a healthy diet, exercising regularly, getting enough sleep, and avoiding activities that may be risky. It is often helpful to talk to others through support groups or a counselor or social worker to discuss any feelings of anxiety, frustration, anger, loneliness, or depression. All of these feelings are normal, and dealing with these feelings can help you to feel more in control of your life and health. It can also help to talk to other people who are going through a similar experience. Another issue to consider is how to tell your family and friends about your diagnosis. Explaining the disease can help others to understand what to expect and how they can help, now and in the future. This can be especially helpful for children and grandchildren, who may not be familiar with the condition. While many  people are able to live alone in the early stages of dementia, you may need help with tasks such as housekeeping, cooking, transportation, and paying bills. If possible, ask a friend or family member for help making plans to deal with these and other issues as dementia progresses. Occupational therapists, and sometimes speech pathologists, can help to set up your home to  minimize confusion and keep you independent for as long as possible. It's also important to establish Power of Attorney and Health Care Proxy statuses early, before a financial or health care crisis happens. This involves completing paperwork to determine who can make decisions on your behalf if needed. In addition, you should discuss your preferences regarding issues that are likely to become important as your dementia worsens, including: ?Is health insurance available, and what does it cover? ?Where will I live? ?Who will make health care and end-of-life decisions if I can't make them for myself? ?Who will pay for care? A number of resources are available to assist in this type of planning.   For caregivers -- Dementia can also impose an enormous burden on families and other caregivers. People with dementia become less able to care for themselves as the condition progresses. If you are caring for someone with dementia, the following may help: ?Make a daily plan and prepare to be flexible if needed. ?Try to be patient when responding to repetitive questions, behaviors, or statements. ?Try not to argue or confront the person with dementia when they express mistaken ideas or facts. Change the subject or gently remind the person of an inaccuracy. Arguing or trying to convince a person of "the truth" is a natural reaction but it can be frustrating to all and can trigger unwanted behavior and feelings. ?Use memory aids such as writing out a list of daily activities, phone numbers, and instructions for usual tasks (ie, the telephone, microwave, etc). It may help if these are posted and easily visible so that the person need not remember to look for the aids. ?Establish calm and consistent nighttime routines to manage behavioral problems, which are often worst at night. Leave a night light on in the person's bedroom. ?Avoid major changes to the home environment (for example, rearranging furniture). ?Employ  safety measures in the home, such as putting locks on medicine cabinets, keeping furniture in the same place to prevent falls, reducing clutter, removing electrical appliances from the bathroom, installing grab bars in the bathroom, and setting the water heater below 120F. ?Help the person with personal care tasks as needed. It is not necessary to bathe every day, although a health care provider should be notified if the person develops sores in the mouth or genitals related to hygiene problems (eg, ill-fitting dentures, urine leakage). ?Speak slowly, present only one idea at a time, and be patient when waiting for responses. ?Encourage physical activity and exercise. Even a daily walk can help prevent physical decline and improve behavioral problems. ?Consider respite care. Respite care can provide a needed break and give you a chance to recharge. This is offered in many communities in the form of in-home care or adult day care. Caregiving can be an all-consuming experience, and it's essential to take time for yourself, take care of your own medical problems, and arrange for breaks when you need them. ?See if your area has a support group for people caring for loved ones with dementia. It can help to talk with other people who understand what you are going through.

## 2022-04-04 ENCOUNTER — Telehealth: Payer: Self-pay

## 2022-04-04 ENCOUNTER — Telehealth: Payer: Self-pay | Admitting: Psychiatry

## 2022-04-04 LAB — VITAMIN B12: Vitamin B-12: 261 pg/mL (ref 232–1245)

## 2022-04-04 NOTE — Telephone Encounter (Signed)
Patient wants open MRI. The only one around this area is a [facility]. I tried to find one closer to [facility] and was not able to. She is self-pay, I faxed the order to [facility].

## 2022-04-04 NOTE — Telephone Encounter (Signed)
-----   Message from Ocie Doyne, MD sent at 04/04/2022  8:29 AM EDT ----- B12 level is on the low end of normal. She can try taking over the counter B12 supplements (1000 mcg daily) to see if that helps with her memory. Her daughter Neysa Bonito prefers to be called with her results, thanks!

## 2022-04-04 NOTE — Telephone Encounter (Signed)
PHONE RM CAN RELAY Contacted pt daughter, LVM rq call back.  Pt Vit B12 level is on the low end of normal. She can try taking over the counter B12 supplements (1000 mcg daily) to see if that helps with her memory. No other concerns

## 2022-04-08 ENCOUNTER — Other Ambulatory Visit (HOSPITAL_COMMUNITY)
Admission: RE | Admit: 2022-04-08 | Discharge: 2022-04-08 | Disposition: A | Discharge status: Home / Self Care | Payer: Self-pay | Source: Ambulatory Visit | Attending: Physician Assistant | Admitting: Physician Assistant

## 2022-04-08 DIAGNOSIS — E785 Hyperlipidemia, unspecified: Secondary | ICD-10-CM

## 2022-04-08 DIAGNOSIS — E039 Hypothyroidism, unspecified: Secondary | ICD-10-CM

## 2022-04-08 DIAGNOSIS — I10 Essential (primary) hypertension: Secondary | ICD-10-CM

## 2022-04-08 LAB — LIPID PANEL
Cholesterol: 189 mg/dL (ref 0–200)
HDL: 72 mg/dL (ref >40)
LDL Cholesterol: 102 mg/dL — ABNORMAL HIGH (ref 0–99)
Total CHOL/HDL Ratio: 2.6 RATIO
Triglycerides: 75 mg/dL (ref <150)
VLDL: 15 mg/dL (ref 0–40)

## 2022-04-08 LAB — COMPREHENSIVE METABOLIC PANEL
ALT: 11 U/L (ref 0–44)
AST: 18 U/L (ref 15–41)
Albumin: 4.1 g/dL (ref 3.5–5.0)
Alkaline Phosphatase: 62 U/L (ref 38–126)
Anion gap: 8 (ref 5–15)
BUN: 12 mg/dL (ref 8–23)
CO2: 27 mmol/L (ref 22–32)
Calcium: 9.2 mg/dL (ref 8.9–10.3)
Chloride: 105 mmol/L (ref 98–111)
Creatinine, Ser: 0.71 mg/dL (ref 0.44–1.00)
GFR, Estimated: >60 mL/min (ref >60)
Glucose, Bld: 92 mg/dL (ref 70–99)
Potassium: 4.2 mmol/L (ref 3.5–5.1)
Sodium: 140 mmol/L (ref 135–145)
Total Bilirubin: 0.5 mg/dL (ref 0.3–1.2)
Total Protein: 7 g/dL (ref 6.5–8.1)

## 2022-04-08 LAB — TSH: TSH: 3.383 u[IU]/mL (ref 0.350–4.500)

## 2022-04-09 ENCOUNTER — Encounter: Payer: Self-pay | Admitting: Physician Assistant

## 2022-04-09 ENCOUNTER — Ambulatory Visit: Payer: Self-pay | Admitting: Physician Assistant

## 2022-04-09 VITALS — BP 100/66 | HR 58 | Temp 96.7°F | Ht 61.0 in | Wt 129.5 lb

## 2022-04-09 DIAGNOSIS — Z1211 Encounter for screening for malignant neoplasm of colon: Secondary | ICD-10-CM

## 2022-04-09 DIAGNOSIS — Z5320 Procedure and treatment not carried out because of patient's decision for unspecified reasons: Secondary | ICD-10-CM

## 2022-04-09 DIAGNOSIS — E039 Hypothyroidism, unspecified: Secondary | ICD-10-CM

## 2022-04-09 DIAGNOSIS — R413 Other amnesia: Secondary | ICD-10-CM

## 2022-04-09 DIAGNOSIS — E785 Hyperlipidemia, unspecified: Secondary | ICD-10-CM

## 2022-04-09 DIAGNOSIS — Z1239 Encounter for other screening for malignant neoplasm of breast: Secondary | ICD-10-CM

## 2022-04-09 DIAGNOSIS — I10 Essential (primary) hypertension: Secondary | ICD-10-CM

## 2022-04-09 MED ORDER — AMLODIPINE BESYLATE 10 MG PO TABS: ORAL_TABLET | ORAL | 0 refills | Status: DC

## 2022-04-09 MED ORDER — BETAMETHASONE DIPROPIONATE AUG 0.05 % EX CREA: TOPICAL_CREAM | Freq: Two times a day (BID) | CUTANEOUS | 0 refills | Status: DC

## 2022-04-09 MED ORDER — LEVOTHYROXINE SODIUM 50 MCG PO TABS
ORAL_TABLET | ORAL | 0 refills | Status: DC
Start: 2022-04-09 — End: 2022-07-16

## 2022-04-09 MED ORDER — METOPROLOL TARTRATE 50 MG PO TABS: 50.0000 mg | ORAL_TABLET | Freq: Two times a day (BID) | ORAL | 0 refills | Status: DC

## 2022-04-09 NOTE — Progress Notes (Unsigned)
BP 100/66   Pulse (!) 58   Temp (!) 96.7 F (35.9 C)   Ht 5\' 1"  (1.549 m)   Wt 129 lb 8 oz (58.7 kg)   SpO2 93%   BMI 24.47 kg/m    Subjective:    Patient ID: , female    DOB: [birth year], 61 y.o.   MRN: [id number]  HPI: Stephanie Howe is a 61 y.o. female presenting on 04/09/2022 for Hypertension and Hyperlipidemia   HPI  Chief Complaint  Patient presents with   Hypertension   Hyperlipidemia     Pt saw cardiologist (referred for multifocal PVCs) who referred her to neurologist for dementia/memory problems.   She was seen by neurologist as well.   Pt has no complaints today except she is still very focused on the spots on her legs.  One area resolved with the elocon cream.  She feels like 2 other spots are unchanged and wants another cream.      Relevant past medical, surgical, family and social history reviewed and updated as indicated. Interim medical history since our last visit reviewed. Allergies and medications reviewed and updated.    Current Outpatient Medications:    amLODipine (NORVASC) 10 MG tablet, TAKE 1 Tablet BY MOUTH ONCE EVERY DAY, Disp: 90 tablet, Rfl: 0   aspirin EC 81 MG tablet, Take 81 mg by mouth daily. Swallow whole., Disp: , Rfl:    levothyroxine (SYNTHROID) 50 MCG tablet, TAKE 1 & 1/2 Tablets BY MOUTH ONCE EVERY DAY ON MONDAY, WEDNESDAY AND FRIDAY AND TAKE 1 Tablet BY MOUTH ONCE EVERY DAY ON SUNDAY, TUESDAY, THURSDAY, AND SATURDAY, Disp: 135 tablet, Rfl: 0   metoprolol tartrate (LOPRESSOR) 50 MG tablet, TAKE 1 Tablet  BY MOUTH TWICE DAILY, Disp: 180 tablet, Rfl: 0   vitamin B-12 (CYANOCOBALAMIN) 1000 MCG tablet, Take 1,000 mcg by mouth daily., Disp: , Rfl:    mometasone (ELOCON) 0.1 % cream, Apply thin film to affected area bid (Patient not taking: Reported on 04/09/2022), Disp: 15 g, Rfl: 1    Review of Systems  Per HPI unless specifically indicated above     Objective:    BP 100/66   Pulse (!) 58   Temp (!) 96.7 F (35.9  C)   Ht 5\' 1"  (1.549 m)   Wt 129 lb 8 oz (58.7 kg)   SpO2 93%   BMI 24.47 kg/m   Wt Readings from Last 3 Encounters:  04/09/22 129 lb 8 oz (58.7 kg)  04/03/22 152 lb (68.9 kg)  02/25/22 130 lb 9.6 oz (59.2 kg)    Physical Exam Vitals reviewed.  Constitutional:      General: She is not in acute distress.    Appearance: She is well-developed. She is not toxic-appearing.  HENT:     Head: Normocephalic and atraumatic.  Cardiovascular:     Rate and Rhythm: Normal rate and regular rhythm.  Pulmonary:     Effort: Pulmonary effort is normal.     Breath sounds: Normal breath sounds.  Abdominal:     General: Bowel sounds are normal.     Palpations: Abdomen is soft. There is no mass.     Tenderness: There is no abdominal tenderness.  Musculoskeletal:     Cervical back: Neck supple.     Right lower leg: No edema.     Left lower leg: No edema.  Lymphadenopathy:     Cervical: No cervical adenopathy.  Skin:    General: Skin is warm and dry.  Comments: Approximately 1 inch diameter area on left upper anterior shin and another on posterior right lower leg/achilles area.  Areas are mildly hyperpigmented, no discrete lesions, no plaques  Neurological:     Mental Status: She is alert and oriented to person, place, and time.  Psychiatric:        Attention and Perception: Attention normal.        Speech: Speech normal.        Behavior: Behavior normal. Behavior is cooperative.     Results for orders placed or performed during the hospital encounter of 04/08/22  Lipid panel  Result Value Ref Range   Cholesterol 189 0 - 200 mg/dL   Triglycerides 75 <245 mg/dL   HDL 72 >80 mg/dL   Total CHOL/HDL Ratio 2.6 RATIO   VLDL 15 0 - 40 mg/dL   LDL Cholesterol 998 (H) 0 - 99 mg/dL  Comprehensive metabolic panel  Result Value Ref Range   Sodium 140 135 - 145 mmol/L   Potassium 4.2 3.5 - 5.1 mmol/L   Chloride 105 98 - 111 mmol/L   CO2 27 22 - 32 mmol/L   Glucose, Bld 92 70 - 99 mg/dL    BUN 12 8 - 23 mg/dL   Creatinine, Ser 3.38 0.44 - 1.00 mg/dL   Calcium 9.2 8.9 - 25.0 mg/dL   Total Protein 7.0 6.5 - 8.1 g/dL   Albumin 4.1 3.5 - 5.0 g/dL   AST 18 15 - 41 U/L   ALT 11 0 - 44 U/L   Alkaline Phosphatase 62 38 - 126 U/L   Total Bilirubin 0.5 0.3 - 1.2 mg/dL   GFR, Estimated >53 >97 mL/min   Anion gap 8 5 - 15  TSH  Result Value Ref Range   TSH 3.383 0.350 - 4.500 uIU/mL      Assessment & Plan:   Encounter Diagnoses  Name Primary?   Essential hypertension Yes   Acquired hypothyroidism    Hyperlipidemia, unspecified hyperlipidemia type    Memory change    Encounter for screening for malignant neoplasm of breast, unspecified screening modality    Screening for colon cancer    Pap smear of cervix declined      -reviewed labs with pt -pt to continue current medications -pt is referred for screening Mammogram -pt last Colonoscopy which showed polyps was in 2008.  Discussed with pt that she is past due to repeat colonoscopy but she declined to have another.  she  Agrees to do another FIT test. -pt Declines information on research studies related to dementia that are being done in [place] and [place] -discussed with pt that as we age, we get spots as sun damage and they won't all go away.  She feels her spot should go away.  Will try Stronger steroid cream for spots -2 on legs- she is encouraged to avoid using it anywhere except on those 2 spots -pt Declines pap -pt was educated about [email or web address] which can help with future planning in light of her memory issues -pt to follow up 3 months.  She is to contact office sooner prn

## 2022-04-29 NOTE — Telephone Encounter (Signed)
Patient has [facility] card so she is not able to be seen at [facility]. Her daughter is going to call [facility] to schedule the MRI there.

## 2022-05-15 ENCOUNTER — Ambulatory Visit (HOSPITAL_COMMUNITY)
Admission: RE | Admit: 2022-05-15 | Discharge: 2022-05-15 | Disposition: A | Discharge status: Home / Self Care | Payer: Self-pay | Source: Ambulatory Visit | Attending: Psychiatry | Admitting: Psychiatry

## 2022-05-15 DIAGNOSIS — R413 Other amnesia: Secondary | ICD-10-CM | POA: Insufficient documentation

## 2022-05-15 MED ORDER — GADOBUTROL 1 MMOL/ML IV SOLN
6.0000 mL | Freq: Once | INTRAVENOUS | Status: AC | PRN
  Administered 2022-05-15: 6 mL via INTRAVENOUS

## 2022-05-16 ENCOUNTER — Telehealth: Payer: Self-pay | Admitting: *Deleted

## 2022-05-16 MED ORDER — DONEPEZIL HCL 5 MG PO TABS: ORAL_TABLET | ORAL | 6 refills | Status: DC

## 2022-05-16 NOTE — Telephone Encounter (Signed)
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 This can contribute to memory issues. These spots are permanent, but she can prevent more spots from accumulating by controlling her blood pressure and cholesterol. Otherwise her brain looks normal.  MRI also shows a 6 mm nodule on her left scalp, but it's not clear what it is based on the images. If she has not had this examined, then she should let her family doctor know so they can take a look at it and decide if it needs any further workup. Answered her questions to her stated satisfaction. Daughter asked if Dr Delena Bali is going to start patient on memory medication. I advised will ask MD and let her know. She verbalized understanding, appreciation.

## 2022-05-16 NOTE — Telephone Encounter (Signed)
Called daughter, advised of new Rx, gave her directions per Dr Delena Bali. She verbalized understanding, appreciation.

## 2022-05-16 NOTE — Telephone Encounter (Signed)
I sent an rx for donepezil to her pharmacy. She will take 1 pill daily for the first 4 weeks, then will increase to 2 pills daily

## 2022-05-20 ENCOUNTER — Other Ambulatory Visit: Payer: Self-pay | Admitting: Physician Assistant

## 2022-05-20 MED ORDER — METOPROLOL TARTRATE 50 MG PO TABS: 50.0000 mg | ORAL_TABLET | Freq: Two times a day (BID) | ORAL | 0 refills | Status: DC

## 2022-05-28 ENCOUNTER — Telehealth: Payer: Self-pay

## 2022-05-28 ENCOUNTER — Other Ambulatory Visit: Payer: Self-pay | Admitting: Physician Assistant

## 2022-05-28 DIAGNOSIS — Z1231 Encounter for screening mammogram for malignant neoplasm of breast: Secondary | ICD-10-CM

## 2022-05-28 NOTE — Telephone Encounter (Signed)
Telephoned patient at mobile number. Patient does not have a voice mail. [facility] (scholarship) did not leave a message.

## 2022-06-08 ENCOUNTER — Emergency Department (HOSPITAL_COMMUNITY): Payer: Self-pay

## 2022-06-08 ENCOUNTER — Encounter (HOSPITAL_COMMUNITY): Payer: Self-pay | Admitting: *Deleted

## 2022-06-08 ENCOUNTER — Emergency Department (HOSPITAL_COMMUNITY)
Admission: EM | Admit: 2022-06-08 | Discharge: 2022-06-08 | Disposition: A | Discharge status: Home / Self Care | Payer: Self-pay | Attending: Emergency Medicine | Admitting: Emergency Medicine

## 2022-06-08 ENCOUNTER — Other Ambulatory Visit: Payer: Self-pay

## 2022-06-08 DIAGNOSIS — I10 Essential (primary) hypertension: Secondary | ICD-10-CM | POA: Insufficient documentation

## 2022-06-08 DIAGNOSIS — R1030 Lower abdominal pain, unspecified: Secondary | ICD-10-CM | POA: Insufficient documentation

## 2022-06-08 DIAGNOSIS — Z79899 Other long term (current) drug therapy: Secondary | ICD-10-CM | POA: Insufficient documentation

## 2022-06-08 DIAGNOSIS — J449 Chronic obstructive pulmonary disease, unspecified: Secondary | ICD-10-CM | POA: Insufficient documentation

## 2022-06-08 DIAGNOSIS — E039 Hypothyroidism, unspecified: Secondary | ICD-10-CM | POA: Insufficient documentation

## 2022-06-08 DIAGNOSIS — Z87891 Personal history of nicotine dependence: Secondary | ICD-10-CM | POA: Insufficient documentation

## 2022-06-08 DIAGNOSIS — Z7982 Long term (current) use of aspirin: Secondary | ICD-10-CM | POA: Insufficient documentation

## 2022-06-08 DIAGNOSIS — R109 Unspecified abdominal pain: Secondary | ICD-10-CM

## 2022-06-08 LAB — CBC WITH DIFFERENTIAL/PLATELET
Abs Immature Granulocytes: 0.06 10*3/uL (ref 0.00–0.07)
Basophils Absolute: 0.1 10*3/uL (ref 0.0–0.1)
Basophils Relative: 1 %
Eosinophils Absolute: 0.4 10*3/uL (ref 0.0–0.5)
Eosinophils Relative: 4 %
HCT: 43.5 % (ref 36.0–46.0)
Hemoglobin: 13.7 g/dL (ref 12.0–15.0)
Immature Granulocytes: 1 %
Lymphocytes Relative: 18 %
Lymphs Abs: 1.5 10*3/uL (ref 0.7–4.0)
MCH: 25.5 pg — ABNORMAL LOW (ref 26.0–34.0)
MCHC: 31.5 g/dL (ref 30.0–36.0)
MCV: 80.9 fL (ref 80.0–100.0)
Monocytes Absolute: 0.5 10*3/uL (ref 0.1–1.0)
Monocytes Relative: 6 %
Neutro Abs: 5.9 10*3/uL (ref 1.7–7.7)
Neutrophils Relative %: 70 %
Platelets: 383 10*3/uL (ref 150–400)
RBC: 5.38 MIL/uL — ABNORMAL HIGH (ref 3.87–5.11)
RDW: 15.9 % — ABNORMAL HIGH (ref 11.5–15.5)
WBC: 8.4 10*3/uL (ref 4.0–10.5)
nRBC: 0 % (ref 0.0–0.2)

## 2022-06-08 LAB — URINALYSIS, ROUTINE W REFLEX MICROSCOPIC
Bilirubin Urine: NEGATIVE
Glucose, UA: NEGATIVE mg/dL
Hgb urine dipstick: NEGATIVE
Ketones, ur: NEGATIVE mg/dL
Leukocytes,Ua: NEGATIVE
Nitrite: NEGATIVE
Protein, ur: NEGATIVE mg/dL
Specific Gravity, Urine: 1.006 (ref 1.005–1.030)
pH: 7 (ref 5.0–8.0)

## 2022-06-08 LAB — COMPREHENSIVE METABOLIC PANEL
ALT: 12 U/L (ref 0–44)
AST: 18 U/L (ref 15–41)
Albumin: 4.5 g/dL (ref 3.5–5.0)
Alkaline Phosphatase: 74 U/L (ref 38–126)
Anion gap: 8 (ref 5–15)
BUN: 12 mg/dL (ref 8–23)
CO2: 25 mmol/L (ref 22–32)
Calcium: 9.4 mg/dL (ref 8.9–10.3)
Chloride: 105 mmol/L (ref 98–111)
Creatinine, Ser: 0.67 mg/dL (ref 0.44–1.00)
GFR, Estimated: >60 mL/min (ref >60)
Glucose, Bld: 88 mg/dL (ref 70–99)
Potassium: 3.9 mmol/L (ref 3.5–5.1)
Sodium: 138 mmol/L (ref 135–145)
Total Bilirubin: 0.6 mg/dL (ref 0.3–1.2)
Total Protein: 8 g/dL (ref 6.5–8.1)

## 2022-06-08 LAB — LIPASE, BLOOD: Lipase: 34 U/L (ref 11–51)

## 2022-06-08 MED ORDER — CYCLOBENZAPRINE HCL 10 MG PO TABS: 10.0000 mg | ORAL_TABLET | Freq: Two times a day (BID) | ORAL | 0 refills | Status: DC | PRN

## 2022-06-08 MED ORDER — CYCLOBENZAPRINE HCL 10 MG PO TABS
10.0000 mg | ORAL_TABLET | Freq: Once | ORAL | Status: AC
  Administered 2022-06-08: 10 mg via ORAL
  Filled 2022-06-08: qty 1

## 2022-06-08 MED ORDER — IOHEXOL 350 MG/ML SOLN
100.0000 mL | Freq: Once | INTRAVENOUS | Status: AC | PRN
  Administered 2022-06-08: 100 mL via INTRAVENOUS

## 2022-06-08 NOTE — ED Triage Notes (Signed)
Pt with lower abd pain for past few days, right worse than the left. Denies any N/V/D.  LBM today and normal per pt.

## 2022-06-08 NOTE — ED Provider Notes (Signed)
[facility] EMERGENCY DEPARTMENT Provider Note   CSN: [id number] Arrival date & time: 06/08/22  1734     History  Chief Complaint  Patient presents with   Abdominal Pain    Stephanie Howe is a 61 y.o. female with history of hypertension, COPD, hypothyroidism, hysterectomy and bilateral oophorectomy and self-reported history of appendectomy presents to the emergency department for evaluation of lower abdominal pain that started about 2 days ago.  Patient states pain has been constant and is described as a sharp/cramping pain that radiates across the entire lower abdomen, possibly worse on the right side.  She states it feels similar to severe premenstrual cramps, however she no longer has menstrual cycles.  She denies vaginal bleeding, dysuria and hematuria.  She denies fevers, chills, nausea, vomiting and diarrhea.  She states her only symptom is the abdominal pain.   Abdominal Pain Associated symptoms: no diarrhea, no dysuria, no fever, no hematuria and no nausea        Home Medications Prior to Admission medications   Medication Sig Start Date End Date Taking? Authorizing Provider  amLODipine (NORVASC) 10 MG tablet TAKE 1 Tablet BY MOUTH ONCE EVERY DAY 04/09/22   Soyla Dryer, PA-C  aspirin EC 81 MG tablet Take 81 mg by mouth daily. Swallow whole.    [provider]  augmented betamethasone dipropionate (DIPROLENE AF) 0.05 % cream Apply topically 2 (two) times daily. 04/09/22   Soyla Dryer, PA-C  donepezil (ARICEPT) 5 MG tablet Take 5 mg daily for 4 weeks, then increase to 10 mg daily 05/16/22   Genia Harold, MD  levothyroxine (SYNTHROID) 50 MCG tablet TAKE 1 & 1/2 Tablets BY MOUTH ONCE EVERY DAY ON MONDAY, WEDNESDAY AND FRIDAY AND TAKE 1 Tablet BY MOUTH ONCE EVERY DAY ON SUNDAY, TUESDAY, THURSDAY, AND SATURDAY 04/09/22   Soyla Dryer, PA-C  metoprolol tartrate (LOPRESSOR) 50 MG tablet Take 1 tablet (50 mg total) by mouth 2 (two) times daily. 05/20/22   Soyla Dryer, PA-C  vitamin B-12 (CYANOCOBALAMIN) 1000 MCG tablet Take 1,000 mcg by mouth daily.    [provider]      Allergies    Codeine and Penicillins    Review of Systems   Review of Systems  Constitutional:  Negative for fever.  Gastrointestinal:  Positive for abdominal pain. Negative for diarrhea and nausea.  Genitourinary:  Negative for dysuria and hematuria.  Neurological:  Negative for headaches.    Physical Exam Updated Vital Signs BP (!) 164/94   Pulse 78   Temp 98 F (36.7 C) (Oral)   Resp 14   Ht 5\' 1"  (1.549 m)   Wt 59.6 kg   SpO2 97%   BMI 24.81 kg/m  Physical Exam Vitals and nursing note reviewed.  Constitutional:      General: She is not in acute distress.    Appearance: She is not ill-appearing.  HENT:     Head: Atraumatic.  Eyes:     Conjunctiva/sclera: Conjunctivae normal.  Cardiovascular:     Rate and Rhythm: Normal rate and regular rhythm.     Pulses: Normal pulses.     Heart sounds: No murmur heard. Pulmonary:     Effort: Pulmonary effort is normal. No respiratory distress.     Breath sounds: Normal breath sounds.  Abdominal:     General: Abdomen is flat. There is no distension.     Palpations: Abdomen is soft.     Tenderness: There is abdominal tenderness in the suprapubic area. There is  no right CVA tenderness or left CVA tenderness. Negative signs include Murphy's sign and McBurney's sign.    Musculoskeletal:        General: Normal range of motion.     Cervical back: Normal range of motion.  Skin:    General: Skin is warm and dry.     Capillary Refill: Capillary refill takes less than 2 seconds.  Neurological:     General: No focal deficit present.     Mental Status: She is alert.  Psychiatric:        Mood and Affect: Mood normal.     ED Results / Procedures / Treatments   Labs (all labs ordered are listed, but only abnormal results are displayed) Labs Reviewed  CBC WITH DIFFERENTIAL/PLATELET - Abnormal; Notable for  the following components:      Result Value   RBC 5.38 (*)    MCH 25.5 (*)    RDW 15.9 (*)    All other components within normal limits  URINALYSIS, ROUTINE W REFLEX MICROSCOPIC - Abnormal; Notable for the following components:   Color, Urine STRAW (*)    All other components within normal limits  COMPREHENSIVE METABOLIC PANEL  LIPASE, BLOOD    EKG None  Radiology No results found.  Procedures Procedures    Medications Ordered in ED Medications - No data to display  ED Course/ Medical Decision Making/ A&P                           Medical Decision Making Amount and/or Complexity of Data Reviewed Labs: ordered. Radiology: ordered.   Social determinants of health:  Social History   Socioeconomic History   Marital status: Single    Spouse name: Not on file   Number of children: Not on file   Years of education: Not on file   Highest education level: Not on file  Occupational History   Not on file  Tobacco Use   Smoking status: Former    Packs/day: 1.50    Years: 30.00    Total pack years: 45.00    Types: Cigarettes    Quit date: 09/24/2011    Years since quitting: 10.7   Smokeless tobacco: Never  Vaping Use   Vaping Use: Never used  Substance and Sexual Activity   Alcohol use: Never   Drug use: Never   Sexual activity: Not Currently    Birth control/protection: Post-menopausal  Other Topics Concern   Not on file  Social History Narrative   Right handed   Caffeine intake 64 oz of tea daily   Social Determinants of Health   Financial Resource Strain: Not on file  Food Insecurity: Not on file  Transportation Needs: Not on file  Physical Activity: Not on file  Stress: Not on file  Social Connections: Not on file  Intimate Partner Violence: Not on file     Initial impression:  This patient presents to the ED for concern of lower abdominal pain, this involves an extensive number of treatment options, and is a complaint that carries with it a high  risk of complications and morbidity.   Differentials include appendicitis, cystitis, pyelonephritis, nephrolithiasis, intra-abdominal infection, SBO.   Comorbidities affecting care:  Per HPI  Additional history obtained: Recent labs  Lab Tests  I Ordered, reviewed, and interpreted labs and EKG.  The pertinent results include:  CBC: Normal UA: Normal Pending CMP and lipase  Imaging Studies ordered:  I ordered imaging studies including  Pending CT abdomen and pelvis I independently visualized and interpreted imaging and I agree with the radiologist interpretation.    ED Course/Re-evaluation: Patient is overall well-appearing and in no acute distress.  Vitals without significant abnormality.  On exam, abdomen is soft, nondistended and tender across the lower abdomen and bandlike pattern.  Negative Murphy, negative McBurney sign.  Patient declines pain medication.  I initially withheld ordering CT abdomen and pelvis to rule out urinary tract infection as cause of her symptoms.  UA was negative for infection, so CT abdomen pelvis ordered.  CBC without leukocytosis.   Patient mentions to RN that she does think that maybe she pulled a muscle in her lower abdomen yesterday when her power steering went out.  No MVA or impact injury.  We will still obtain CT abdomen out of abundance of caution. Patient care handed off to [facility] who will monitor for patient's labs and imaging.  If these are all normal, patient can be discharged home with Tylenol/Motrin for pain and follow-up with PCP.   Final Clinical Impression(s) / ED Diagnoses Final diagnoses:  None    Rx / DC Orders ED Discharge Orders     None         Rodena Piety 06/08/22 Rayford Halsted, MD 06/09/22 1010

## 2022-06-08 NOTE — ED Notes (Signed)
Went over d/c papers. Ambulatory to lobby. All questions answered.  ?

## 2022-06-08 NOTE — ED Notes (Signed)
Ambulatory to restroom without assist.  

## 2022-06-08 NOTE — Discharge Instructions (Signed)
Right side pain I suspect this is more a muscular strain, given you a muscle relaxer take as prescribed, be aware this medication can make you drowsy do not operate heavy machinery while taking this medication or consume alcohol.  If symptoms not proving the weeks time please follow-up with your primary care doctor. Enhanced lesion in the liver-likely a benign collection of blood vessels in your liver, I have given you a copy of the radiology summary, may follow-up with your primary care doctor for further evaluation if necessary.  Come back to the emergency department if you develop chest pain, shortness of breath, severe abdominal pain, uncontrolled nausea, vomiting, diarrhea.

## 2022-06-08 NOTE — ED Provider Notes (Signed)
Received at shift change from [facility], PA-C please see note for full detail  In short patient with medical history including hypertension, COPD, full hysterectomy, appendicectomy presents  for complaints of right side pain, started about 2 days ago, has remained constant, worsened with movement, no associated nausea or vomiting no urinary symptoms, still passing gas having normal bowel movements, no fevers or chills, endorse that she has had pain like this when she had her ovaries.  She does note that she thinks might of strained the muscles on that side because the power steering in her car had gone out and she has been trying to turn it.  Per previous provider follow-up on CT scan and discharge accordingly. Physical Exam  BP (!) 161/99   Pulse 73   Temp 98 F (36.7 C) (Oral)   Resp 19   Ht 5' 1"  (1.549 m)   Wt 59.6 kg   SpO2 96%   BMI 24.81 kg/m   Physical Exam Vitals and nursing note reviewed.  Constitutional:      General: She is not in acute distress.    Appearance: She is not ill-appearing.  HENT:     Head: Normocephalic and atraumatic.     Nose: No congestion.  Eyes:     Conjunctiva/sclera: Conjunctivae normal.  Cardiovascular:     Rate and Rhythm: Normal rate and regular rhythm.     Pulses: Normal pulses.     Heart sounds:     No friction rub.  Pulmonary:     Effort: Pulmonary effort is normal.  Abdominal:     Palpations: Abdomen is soft.     Tenderness: There is abdominal tenderness. There is no right CVA tenderness or left CVA tenderness.     Comments: Abdomen nondistended, soft, slight tenderness noted mainly around the right side, no guarding rebound as or peritoneal sign negative Murphy sign McBurney point, she no CVA tenderness.  Musculoskeletal:     Comments: Spine was palpated was nontender to palpation no step-off deformities noted no overlying skin changes, patient point tenderness around the musculature on her right iliac crest pain is focalized  reproducible, she had positive straight leg raise on the right side.  Patient on 5-5 strength neurovascularly intact in lower extremities.  Skin:    General: Skin is warm and dry.  Neurological:     Mental Status: She is alert.  Psychiatric:        Mood and Affect: Mood normal.     Procedures  Procedures  ED Course / MDM    Medical Decision Making Amount and/or Complexity of Data Reviewed Labs: ordered. Radiology: ordered.  Risk Prescription drug management.     Lab Tests:  I Ordered, and personally interpreted labs.  The pertinent results include: CBC unremarkable, CMP unremarkable, lipase 34, UA unremarkable   Imaging Studies ordered:  I ordered imaging studies including CT AP I independently visualized and interpreted imaging which showed no acute abnormalities, shows hyperenhancing lesion right lobe of the liver  I agree with the radiologist interpretation   Cardiac Monitoring:  The patient was maintained on a cardiac monitor.  I personally viewed and interpreted the cardiac monitored which showed an underlying rhythm of: N/A   Medicines ordered and prescription drug management:  I ordered medication including Flexeril I have reviewed the patients home medicines and have made adjustments as needed  Critical Interventions:  N/A   Reevaluation:  Presents with abdominal pain, she is slightly tender on my exam we will provide her  with a muscle laxer I suspect her pain is more muscular in nature.  Patient is reassessed having no complaints, updated on imaging, including the benign finding on her CT scan she is agreement plan discharge at this time.  Consultations Obtained:  N/A  Test Considered:  N/A    Rule out Low suspicion for spinal equina as she has no tenderness along her spine, she has no red flag symptoms i.e. urinary or bowel incontinency's saddle paresthesias paresthesia or weakness moving her legs.  I have low suspicion for liver or  gallbladder abnormality as she has no right upper quadrant tenderness, liver enzymes, alk phos, T bili all within normal limits.  Low suspicion for pancreatitis as lipase is within normal limits.  Low suspicion for ruptured stomach ulcer as she has no peritoneal sign present on exam.  Low suspicion for bowel obstruction, diverticulitis, CT imaging negative these findings.  I doubt dissection as presentation is atypical.   Dispostion and problem list  After consideration of the diagnostic results and the patients response to treatment, I feel that the patent would benefit from discharge.  Right-sided pain-likely muscular in nature, will provide her with a muscle relaxer follow-up with PCP for further evaluation. Hyperenhancing lesion-patient was made aware of this finding on CT scan and was given a copy of it,  will have her follow-up with her PCP for further evaluation.           Marcello Fennel, PA-C 06/08/22 2103    Milton Ferguson, MD 06/09/22 1010

## 2022-06-12 ENCOUNTER — Ambulatory Visit (HOSPITAL_COMMUNITY): Payer: Self-pay

## 2022-07-02 ENCOUNTER — Other Ambulatory Visit: Payer: Self-pay | Admitting: Physician Assistant

## 2022-07-02 DIAGNOSIS — E785 Hyperlipidemia, unspecified: Secondary | ICD-10-CM

## 2022-07-15 ENCOUNTER — Other Ambulatory Visit (HOSPITAL_COMMUNITY)
Admission: RE | Admit: 2022-07-15 | Discharge: 2022-07-15 | Disposition: A | Discharge status: Home / Self Care | Payer: Self-pay | Source: Ambulatory Visit | Attending: Physician Assistant | Admitting: Physician Assistant

## 2022-07-15 DIAGNOSIS — E785 Hyperlipidemia, unspecified: Secondary | ICD-10-CM | POA: Insufficient documentation

## 2022-07-15 LAB — LIPID PANEL
Cholesterol: 214 mg/dL — ABNORMAL HIGH (ref 0–200)
HDL: 68 mg/dL (ref >40)
LDL Cholesterol: 123 mg/dL — ABNORMAL HIGH (ref 0–99)
Total CHOL/HDL Ratio: 3.1 RATIO
Triglycerides: 115 mg/dL (ref <150)
VLDL: 23 mg/dL (ref 0–40)

## 2022-07-16 ENCOUNTER — Ambulatory Visit: Payer: Self-pay | Admitting: Physician Assistant

## 2022-07-16 ENCOUNTER — Encounter: Payer: Self-pay | Admitting: Physician Assistant

## 2022-07-16 VITALS — BP 146/94 | HR 87 | Temp 97.3°F | Wt 129.6 lb

## 2022-07-16 DIAGNOSIS — E039 Hypothyroidism, unspecified: Secondary | ICD-10-CM

## 2022-07-16 DIAGNOSIS — I10 Essential (primary) hypertension: Secondary | ICD-10-CM

## 2022-07-16 DIAGNOSIS — Z5320 Procedure and treatment not carried out because of patient's decision for unspecified reasons: Secondary | ICD-10-CM

## 2022-07-16 MED ORDER — AMLODIPINE BESYLATE 10 MG PO TABS
ORAL_TABLET | ORAL | 0 refills | Status: DC
Start: 2022-07-16 — End: 2022-09-05

## 2022-07-16 MED ORDER — LEVOTHYROXINE SODIUM 50 MCG PO TABS: ORAL_TABLET | ORAL | 0 refills | Status: DC

## 2022-07-16 MED ORDER — METOPROLOL TARTRATE 50 MG PO TABS: 50.0000 mg | ORAL_TABLET | Freq: Two times a day (BID) | ORAL | 0 refills | Status: DC

## 2022-07-16 NOTE — Progress Notes (Unsigned)
BP (!) 146/94   Pulse 87   Temp (!) 97.3 F (36.3 C)   Wt 129 lb 9.6 oz (58.8 kg)   SpO2 94%   BMI 24.49 kg/m    Subjective:    Patient ID: Stephanie Howe, female    DOB: Dec 28, 1960, 61 y.o.   MRN: [id number]  HPI: Stephanie Howe is a 61 y.o. female presenting on 07/16/2022 for Hyperlipidemia and Hypertension (Pt has been out of her medications for about 1 month)   HPI  Chief Complaint  Patient presents with   Hyperlipidemia   Hypertension    Pt has been out of her medications for about 1 month     Pt says she is Doing okay.   She says she has Retired from work (at [employer]).  She says she enjoyed it.    Pt ran out of her bp meds.    Relevant past medical, surgical, family and social history reviewed and updated as indicated. Interim medical history since our last visit reviewed. Allergies and medications reviewed and updated.    Current Outpatient Medications:    aspirin EC 81 MG tablet, Take 81 mg by mouth daily. Swallow whole., Disp: , Rfl:    donepezil (ARICEPT) 5 MG tablet, Take 5 mg daily for 4 weeks, then increase to 10 mg daily, Disp: 60 tablet, Rfl: 6   levothyroxine (SYNTHROID) 50 MCG tablet, TAKE 1 & 1/2 Tablets BY MOUTH ONCE EVERY DAY ON MONDAY, WEDNESDAY AND FRIDAY AND TAKE 1 Tablet BY MOUTH ONCE EVERY DAY ON SUNDAY, TUESDAY, THURSDAY, AND SATURDAY, Disp: 135 tablet, Rfl: 0   vitamin B-12 (CYANOCOBALAMIN) 1000 MCG tablet, Take 1,000 mcg by mouth daily., Disp: , Rfl:    amLODipine (NORVASC) 10 MG tablet, TAKE 1 Tablet BY MOUTH ONCE EVERY DAY (Patient not taking: Reported on 07/16/2022), Disp: 90 tablet, Rfl: 0   augmented betamethasone dipropionate (DIPROLENE AF) 0.05 % cream, Apply topically 2 (two) times daily. (Patient not taking: Reported on 07/16/2022), Disp: 15 g, Rfl: 0   cyclobenzaprine (FLEXERIL) 10 MG tablet, Take 1 tablet (10 mg total) by mouth 2 (two) times daily as needed for muscle spasms. (Patient not taking: Reported on 07/16/2022), Disp: 20  tablet, Rfl: 0   metoprolol tartrate (LOPRESSOR) 50 MG tablet, Take 1 tablet (50 mg total) by mouth 2 (two) times daily. (Patient not taking: Reported on 07/16/2022), Disp: 60 tablet, Rfl: 0    Review of Systems  Per HPI unless specifically indicated above     Objective:    BP (!) 146/94   Pulse 87   Temp (!) 97.3 F (36.3 C)   Wt 129 lb 9.6 oz (58.8 kg)   SpO2 94%   BMI 24.49 kg/m   Wt Readings from Last 3 Encounters:  07/16/22 129 lb 9.6 oz (58.8 kg)  06/08/22 131 lb 4.8 oz (59.6 kg)  04/09/22 129 lb 8 oz (58.7 kg)    Physical Exam Vitals reviewed.  Constitutional:      General: She is not in acute distress.    Appearance: She is well-developed. She is not toxic-appearing.  HENT:     Head: Normocephalic and atraumatic.  Cardiovascular:     Rate and Rhythm: Normal rate and regular rhythm.  Pulmonary:     Effort: Pulmonary effort is normal.     Breath sounds: Normal breath sounds.  Abdominal:     General: Bowel sounds are normal.     Palpations: Abdomen is soft. There is no mass.  Tenderness: There is no abdominal tenderness.  Musculoskeletal:     Cervical back: Neck supple.     Right lower leg: No edema.     Left lower leg: No edema.  Lymphadenopathy:     Cervical: No cervical adenopathy.  Skin:    General: Skin is warm and dry.  Neurological:     Mental Status: She is alert and oriented to person, place, and time.  Psychiatric:        Behavior: Behavior normal.     Results for orders placed or performed during the hospital encounter of 07/15/22  Lipid panel  Result Value Ref Range   Cholesterol 214 (H) 0 - 200 mg/dL   Triglycerides 161 <096 mg/dL   HDL 68 >04 mg/dL   Total CHOL/HDL Ratio 3.1 RATIO   VLDL 23 0 - 40 mg/dL   LDL Cholesterol 540 (H) 0 - 99 mg/dL      Assessment & Plan:   Encounter Diagnoses  Name Primary?   Essential hypertension Yes   Acquired hypothyroidism    Colon cancer screening declined    Screening mammography  declined     -reviewed labs with pt  -pt encouraged to avoid running out of her bp meds.  Refills were sent. -pt was encouraged to follow lowfat diet -pt was given FIT test at her appointment in July that she has not yet returned to office.  She declined to get another test. -screening Mammogram declined -no changes to medications today -pt to follow up 3 months.  She is to RTO sooner prn

## 2022-09-04 ENCOUNTER — Other Ambulatory Visit: Payer: Self-pay | Admitting: Physician Assistant

## 2022-09-05 ENCOUNTER — Other Ambulatory Visit: Payer: Self-pay | Admitting: Physician Assistant

## 2022-09-05 DIAGNOSIS — E039 Hypothyroidism, unspecified: Secondary | ICD-10-CM

## 2022-09-05 MED ORDER — LEVOTHYROXINE SODIUM 50 MCG PO TABS: ORAL_TABLET | ORAL | 1 refills | Status: DC

## 2022-09-25 ENCOUNTER — Other Ambulatory Visit: Payer: Self-pay | Admitting: Physician Assistant

## 2022-09-25 DIAGNOSIS — I10 Essential (primary) hypertension: Secondary | ICD-10-CM

## 2022-09-25 DIAGNOSIS — E039 Hypothyroidism, unspecified: Secondary | ICD-10-CM

## 2022-10-16 ENCOUNTER — Ambulatory Visit: Payer: Self-pay | Admitting: Physician Assistant

## 2022-10-21 ENCOUNTER — Other Ambulatory Visit: Payer: Self-pay | Admitting: Physician Assistant

## 2022-10-21 DIAGNOSIS — E039 Hypothyroidism, unspecified: Secondary | ICD-10-CM

## 2022-10-21 MED ORDER — LEVOTHYROXINE SODIUM 50 MCG PO TABS: ORAL_TABLET | ORAL | 0 refills | Status: DC

## 2022-10-22 ENCOUNTER — Ambulatory Visit (INDEPENDENT_AMBULATORY_CARE_PROVIDER_SITE_OTHER): Payer: Self-pay | Admitting: Psychiatry

## 2022-10-22 ENCOUNTER — Encounter: Payer: Self-pay | Admitting: Psychiatry

## 2022-10-22 VITALS — BP 154/57 | HR 56 | Ht 62.0 in | Wt 127.0 lb

## 2022-10-22 DIAGNOSIS — E538 Deficiency of other specified B group vitamins: Secondary | ICD-10-CM

## 2022-10-22 DIAGNOSIS — R413 Other amnesia: Secondary | ICD-10-CM

## 2022-10-22 MED ORDER — MEMANTINE HCL 5 MG PO TABS: ORAL_TABLET | ORAL | 0 refills | Status: DC

## 2022-10-22 MED ORDER — DONEPEZIL HCL 10 MG PO TABS: 10.0000 mg | ORAL_TABLET | Freq: Every day | ORAL | 6 refills | Status: DC

## 2022-10-22 MED ORDER — MEMANTINE HCL 10 MG PO TABS: 10.0000 mg | ORAL_TABLET | Freq: Two times a day (BID) | ORAL | 5 refills | Status: DC

## 2022-10-22 NOTE — Patient Instructions (Signed)
Continue donepezil  Start Namenda  week one:  take (5mg ) in the morning   week two:  take (5mg ) in the morning and take (5mg ) in the evening  week three: take (10mg ) in the morning and take (5mg ) in the evening  week four:  take  (10mg ) in the morning and take (10mg ) in the evening   -this is the full dose  Referral to neuropsychology for more in-depth memory testing

## 2022-10-22 NOTE — Progress Notes (Signed)
CC:  memory loss  Follow-up Visit  Last visit: 04/03/22  Brief HPI: 62 year old female with a history of HTN, aortic atherosclerosis, hypothyroidism, HLD who follows in clinic for memory loss.   At her last visit, brain MRI and B12 levels were ordered.  Interval History: B12 level was borderline low at 261 and she was recommended to start B12 supplements. Brain MRI showed moderate microvascular changes and no acute intracranial process. It also showed a left scalp nodule favored to be a hemangioma.   She was started on donepezil in August. Memory seems to have worsened over time. She was cooking yesterday and cut up cucumbers, then immediately asked her daughter to cut cucumbers. She was driving her grandson recently and forget he was in the car. She is not driving now due to fear of getting lost. She continues to forget to take her medications even with alarms in her phone. Is repeating herself frequently. She has been alternating staying with her son and her daughter, and will likely move in with her daughter soon.   Physical Exam:   Vital Signs: BP (!) 154/57   Pulse (!) 56   Ht 5\' 2"  (1.575 m)   Wt 127 lb (57.6 kg)   BMI 23.23 kg/m  GENERAL:  well appearing, in no acute distress, alert  SKIN:  Color, texture, turgor normal. No rashes or lesions HEAD:  Normocephalic/atraumatic. RESP: normal respiratory effort MSK:  No gross joint deformities.   NEUROLOGICAL: Mental Status:     10/22/2022    3:52 PM 04/03/2022   10:20 AM  Montreal Cognitive Assessment   Visuospatial/ Executive (0/5) 2 0  Naming (0/3) 3 3  Attention: Read list of digits (0/2) 2 2  Attention: Read list of letters (0/1) 1 1  Attention: Serial 7 subtraction starting at 100 (0/3) 1 2  Language: Repeat phrase (0/2) 2 2  Language : Fluency (0/1) 1 1  Abstraction (0/2) 2 2  Delayed Recall (0/5) 0 0  Orientation (0/6) 3 4  Total 17 17  Adjusted Score (based on education) 18    Cranial Nerves: PERRL, face  symmetric, no dysarthria, hearing grossly intact Motor: moves all extremities equally Gait: normal-based.  IMPRESSION: 62 year old female with a history of HTN, aortic atherosclerosis, hypothyroidism, HLD who presents for follow up of memory loss. She was found to have low B12 at her last visit, but memory has continued to decline despite B12 supplementation. 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 MOCA score today is 18, stable from her previous score. However the patient and her family feel her memory and functioning have continued to decline. Her presentation is concerning for early-onset dementia. Will start Namenda in addition to donepezil, and refer for neuropsychological testing. Discussed finding of scalp hemangioma with patient and recommended she follow up with her PCP for next steps in management.  PLAN: -Repeat B12 level -Continue donepezil 10 mg daily -Start Namenda:  week one:  take (5mg ) in the morning   week two:  take (5mg ) in the morning and take (5mg ) in the evening  week three: take (10mg ) in the morning and take (5mg ) in the evening  week four:  take  (10mg ) in the morning and take (10mg ) in the evening -Referral to Neuropsychology -Follow up with PCP regarding scalp nodule  Follow-up: 6 months  I spent a total of 54 minutes on the date of the service. Discussed medication side effects, adverse reactions  and drug interactions. Written educational materials and patient instructions outlining all of the above were given.  Genia Harold, MD

## 2022-10-23 ENCOUNTER — Ambulatory Visit: Payer: Self-pay | Admitting: Physician Assistant

## 2022-10-23 ENCOUNTER — Telehealth: Payer: Self-pay | Admitting: Psychiatry

## 2022-10-23 LAB — VITAMIN B12: Vitamin B-12: 825 pg/mL (ref 232–1245)

## 2022-10-23 NOTE — Telephone Encounter (Signed)
Referral for Neuropsychology sent through [facility] to [facility]. Phone: [phone]

## 2022-10-25 ENCOUNTER — Encounter: Payer: Self-pay | Admitting: Psychology

## 2022-10-30 ENCOUNTER — Ambulatory Visit: Payer: Self-pay | Admitting: Physician Assistant

## 2022-11-24 ENCOUNTER — Other Ambulatory Visit: Payer: Self-pay | Admitting: Physician Assistant

## 2022-11-24 ENCOUNTER — Encounter: Payer: Self-pay | Admitting: Physician Assistant

## 2022-11-24 ENCOUNTER — Encounter: Payer: Self-pay | Admitting: Psychiatry

## 2022-11-24 ENCOUNTER — Other Ambulatory Visit: Payer: Self-pay | Admitting: Psychiatry

## 2022-11-24 DIAGNOSIS — E039 Hypothyroidism, unspecified: Secondary | ICD-10-CM

## 2022-11-25 ENCOUNTER — Other Ambulatory Visit: Payer: Self-pay | Admitting: Physician Assistant

## 2022-11-25 ENCOUNTER — Telehealth: Payer: Self-pay | Admitting: Physician Assistant

## 2022-11-25 DIAGNOSIS — E039 Hypothyroidism, unspecified: Secondary | ICD-10-CM

## 2022-11-25 MED ORDER — MEMANTINE HCL 10 MG PO TABS: 10.0000 mg | ORAL_TABLET | Freq: Two times a day (BID) | ORAL | 5 refills | Status: DC

## 2022-11-25 MED ORDER — AMLODIPINE BESYLATE 10 MG PO TABS: ORAL_TABLET | ORAL | 0 refills | Status: DC

## 2022-11-25 MED ORDER — METOPROLOL TARTRATE 50 MG PO TABS: 50.0000 mg | ORAL_TABLET | Freq: Two times a day (BID) | ORAL | 0 refills | Status: DC

## 2022-11-25 MED ORDER — LEVOTHYROXINE SODIUM 50 MCG PO TABS: ORAL_TABLET | ORAL | 0 refills | Status: DC

## 2022-11-25 NOTE — Telephone Encounter (Signed)
Pt was called in reference a message sent through [facility] requesting refills.  Pt is now living in [place].  Her enrollment with free clinic has been expired since August 2023.   She has been no-show to appointment scheduled for her last month.  Discussed with pt that she is no longer pt at [facility] due to both of these and she was encouraged to get established with new pcp in [place] as soon as possible.  Her 3 requested meds will be sent to [facility] in [place].  She is notified that this will be her last time.   Pt states understanding and is encouraged to call if she has any further questions

## 2022-11-25 NOTE — Telephone Encounter (Signed)
I refilled memantine for her, but she'll have to get the other refills from her PCP

## 2023-05-21 ENCOUNTER — Ambulatory Visit: Payer: Self-pay | Admitting: Psychiatry

## 2023-07-03 ENCOUNTER — Encounter: Payer: Self-pay | Admitting: Psychology

## 2023-12-02 ENCOUNTER — Emergency Department (HOSPITAL_COMMUNITY)
Admission: EM | Admit: 2023-12-02 | Discharge: 2023-12-02 | Discharge status: Against Medical Advice | Payer: Self-pay | Attending: Emergency Medicine | Admitting: Emergency Medicine

## 2023-12-02 ENCOUNTER — Encounter (HOSPITAL_COMMUNITY): Payer: Self-pay | Admitting: Emergency Medicine

## 2023-12-02 ENCOUNTER — Other Ambulatory Visit: Payer: Self-pay

## 2023-12-02 ENCOUNTER — Emergency Department (HOSPITAL_COMMUNITY): Payer: Self-pay

## 2023-12-02 DIAGNOSIS — G309 Alzheimer's disease, unspecified: Secondary | ICD-10-CM | POA: Diagnosis not present

## 2023-12-02 DIAGNOSIS — M79602 Pain in left arm: Secondary | ICD-10-CM | POA: Diagnosis present

## 2023-12-02 DIAGNOSIS — R0602 Shortness of breath: Secondary | ICD-10-CM | POA: Diagnosis not present

## 2023-12-02 DIAGNOSIS — R079 Chest pain, unspecified: Secondary | ICD-10-CM | POA: Diagnosis not present

## 2023-12-02 DIAGNOSIS — Z87891 Personal history of nicotine dependence: Secondary | ICD-10-CM | POA: Diagnosis not present

## 2023-12-02 DIAGNOSIS — Z5321 Procedure and treatment not carried out due to patient leaving prior to being seen by health care provider: Secondary | ICD-10-CM | POA: Diagnosis not present

## 2023-12-02 LAB — CBC
HCT: 43.1 % (ref 36.0–46.0)
Hemoglobin: 12.8 g/dL (ref 12.0–15.0)
MCH: 22.8 pg — ABNORMAL LOW (ref 26.0–34.0)
MCHC: 29.7 g/dL — ABNORMAL LOW (ref 30.0–36.0)
MCV: 76.7 fL — ABNORMAL LOW (ref 80.0–100.0)
Platelets: 365 10*3/uL (ref 150–400)
RBC: 5.62 MIL/uL — ABNORMAL HIGH (ref 3.87–5.11)
RDW: 16.2 % — ABNORMAL HIGH (ref 11.5–15.5)
WBC: 12.1 10*3/uL — ABNORMAL HIGH (ref 4.0–10.5)
nRBC: 0 % (ref 0.0–0.2)

## 2023-12-02 LAB — BASIC METABOLIC PANEL
Anion gap: 11 (ref 5–15)
BUN: 10 mg/dL (ref 8–23)
CO2: 26 mmol/L (ref 22–32)
Calcium: 9.2 mg/dL (ref 8.9–10.3)
Chloride: 103 mmol/L (ref 98–111)
Creatinine, Ser: 0.76 mg/dL (ref 0.44–1.00)
GFR, Estimated: >60 mL/min (ref >60)
Glucose, Bld: 91 mg/dL (ref 70–99)
Potassium: 3.5 mmol/L (ref 3.5–5.1)
Sodium: 140 mmol/L (ref 135–145)

## 2023-12-02 LAB — TROPONIN I (HIGH SENSITIVITY): Troponin I (High Sensitivity): 12 ng/L (ref <18)

## 2023-12-02 NOTE — ED Triage Notes (Signed)
 Pt c/o of cp/left arm pain for about 1 week and sob since yesterday. Pt is labored and anxious in triage. Pt states she has alzheimer's and is relying on son to answer questions. Pt has a 40+ year history of smoking but quit 7 years ago. Pt denies any flu like symptoms but has had spells of dizziness.

## 2023-12-30 ENCOUNTER — Encounter (HOSPITAL_COMMUNITY): Payer: Self-pay

## 2023-12-30 ENCOUNTER — Other Ambulatory Visit: Payer: Self-pay

## 2023-12-30 ENCOUNTER — Inpatient Hospital Stay (HOSPITAL_COMMUNITY)
Admission: EM | Admit: 2023-12-30 | Discharge: 2024-01-06 | DRG: 189 | Disposition: A | Discharge status: Home / Self Care | Payer: MEDICAID | Attending: Family Medicine | Admitting: Family Medicine

## 2023-12-30 ENCOUNTER — Emergency Department (HOSPITAL_COMMUNITY): Payer: MEDICAID

## 2023-12-30 DIAGNOSIS — J441 Chronic obstructive pulmonary disease with (acute) exacerbation: Secondary | ICD-10-CM | POA: Diagnosis not present

## 2023-12-30 DIAGNOSIS — Z823 Family history of stroke: Secondary | ICD-10-CM | POA: Diagnosis not present

## 2023-12-30 DIAGNOSIS — D509 Iron deficiency anemia, unspecified: Secondary | ICD-10-CM | POA: Diagnosis present

## 2023-12-30 DIAGNOSIS — E663 Overweight: Secondary | ICD-10-CM | POA: Diagnosis present

## 2023-12-30 DIAGNOSIS — R413 Other amnesia: Secondary | ICD-10-CM | POA: Diagnosis present

## 2023-12-30 DIAGNOSIS — J9600 Acute respiratory failure, unspecified whether with hypoxia or hypercapnia: Secondary | ICD-10-CM | POA: Diagnosis present

## 2023-12-30 DIAGNOSIS — Z8249 Family history of ischemic heart disease and other diseases of the circulatory system: Secondary | ICD-10-CM | POA: Diagnosis not present

## 2023-12-30 DIAGNOSIS — R059 Cough, unspecified: Secondary | ICD-10-CM | POA: Diagnosis not present

## 2023-12-30 DIAGNOSIS — K2100 Gastro-esophageal reflux disease with esophagitis, without bleeding: Secondary | ICD-10-CM | POA: Diagnosis present

## 2023-12-30 DIAGNOSIS — Z833 Family history of diabetes mellitus: Secondary | ICD-10-CM

## 2023-12-30 DIAGNOSIS — E559 Vitamin D deficiency, unspecified: Secondary | ICD-10-CM | POA: Diagnosis present

## 2023-12-30 DIAGNOSIS — F0390 Unspecified dementia without behavioral disturbance: Secondary | ICD-10-CM | POA: Diagnosis present

## 2023-12-30 DIAGNOSIS — Z885 Allergy status to narcotic agent status: Secondary | ICD-10-CM | POA: Diagnosis not present

## 2023-12-30 DIAGNOSIS — D72829 Elevated white blood cell count, unspecified: Secondary | ICD-10-CM | POA: Diagnosis present

## 2023-12-30 DIAGNOSIS — Z880 Allergy status to penicillin: Secondary | ICD-10-CM | POA: Diagnosis not present

## 2023-12-30 DIAGNOSIS — Z825 Family history of asthma and other chronic lower respiratory diseases: Secondary | ICD-10-CM

## 2023-12-30 DIAGNOSIS — Z87891 Personal history of nicotine dependence: Secondary | ICD-10-CM | POA: Diagnosis not present

## 2023-12-30 DIAGNOSIS — I10 Essential (primary) hypertension: Secondary | ICD-10-CM | POA: Diagnosis present

## 2023-12-30 DIAGNOSIS — Z90710 Acquired absence of both cervix and uterus: Secondary | ICD-10-CM | POA: Diagnosis not present

## 2023-12-30 DIAGNOSIS — E039 Hypothyroidism, unspecified: Secondary | ICD-10-CM | POA: Diagnosis present

## 2023-12-30 DIAGNOSIS — J9601 Acute respiratory failure with hypoxia: Principal | ICD-10-CM

## 2023-12-30 DIAGNOSIS — R0689 Other abnormalities of breathing: Secondary | ICD-10-CM | POA: Diagnosis not present

## 2023-12-30 DIAGNOSIS — Z1152 Encounter for screening for COVID-19: Secondary | ICD-10-CM

## 2023-12-30 DIAGNOSIS — Z6824 Body mass index (BMI) 24.0-24.9, adult: Secondary | ICD-10-CM | POA: Diagnosis not present

## 2023-12-30 DIAGNOSIS — J449 Chronic obstructive pulmonary disease, unspecified: Secondary | ICD-10-CM | POA: Diagnosis present

## 2023-12-30 DIAGNOSIS — Z79899 Other long term (current) drug therapy: Secondary | ICD-10-CM

## 2023-12-30 DIAGNOSIS — E785 Hyperlipidemia, unspecified: Secondary | ICD-10-CM | POA: Diagnosis present

## 2023-12-30 DIAGNOSIS — Z7982 Long term (current) use of aspirin: Secondary | ICD-10-CM

## 2023-12-30 DIAGNOSIS — Z79890 Hormone replacement therapy (postmenopausal): Secondary | ICD-10-CM | POA: Diagnosis not present

## 2023-12-30 LAB — COMPREHENSIVE METABOLIC PANEL WITH GFR
ALT: 9 U/L (ref 0–44)
AST: 16 U/L (ref 15–41)
Albumin: 3.8 g/dL (ref 3.5–5.0)
Alkaline Phosphatase: 75 U/L (ref 38–126)
Anion gap: 11 (ref 5–15)
BUN: 12 mg/dL (ref 8–23)
CO2: 27 mmol/L (ref 22–32)
Calcium: 9.3 mg/dL (ref 8.9–10.3)
Chloride: 102 mmol/L (ref 98–111)
Creatinine, Ser: 0.75 mg/dL (ref 0.44–1.00)
GFR, Estimated: >60 mL/min (ref >60)
Glucose, Bld: 113 mg/dL — ABNORMAL HIGH (ref 70–99)
Potassium: 3.9 mmol/L (ref 3.5–5.1)
Sodium: 140 mmol/L (ref 135–145)
Total Bilirubin: 0.5 mg/dL (ref 0.0–1.2)
Total Protein: 7.4 g/dL (ref 6.5–8.1)

## 2023-12-30 LAB — RESP PANEL BY RT-PCR (RSV, FLU A&B, COVID)  RVPGX2
Influenza A by PCR: NEGATIVE
Influenza B by PCR: NEGATIVE
Resp Syncytial Virus by PCR: NEGATIVE
SARS Coronavirus 2 by RT PCR: NEGATIVE

## 2023-12-30 LAB — CBC WITH DIFFERENTIAL/PLATELET
Abs Immature Granulocytes: 0.1 10*3/uL — ABNORMAL HIGH (ref 0.00–0.07)
Basophils Absolute: 0.1 10*3/uL (ref 0.0–0.1)
Basophils Relative: 0 %
Eosinophils Absolute: 0.3 10*3/uL (ref 0.0–0.5)
Eosinophils Relative: 2 %
HCT: 43.3 % (ref 36.0–46.0)
Hemoglobin: 12.8 g/dL (ref 12.0–15.0)
Immature Granulocytes: 1 %
Lymphocytes Relative: 9 %
Lymphs Abs: 1.1 10*3/uL (ref 0.7–4.0)
MCH: 22.6 pg — ABNORMAL LOW (ref 26.0–34.0)
MCHC: 29.6 g/dL — ABNORMAL LOW (ref 30.0–36.0)
MCV: 76.4 fL — ABNORMAL LOW (ref 80.0–100.0)
Monocytes Absolute: 0.4 10*3/uL (ref 0.1–1.0)
Monocytes Relative: 3 %
Neutro Abs: 10.5 10*3/uL — ABNORMAL HIGH (ref 1.7–7.7)
Neutrophils Relative %: 85 %
Platelets: 411 10*3/uL — ABNORMAL HIGH (ref 150–400)
RBC: 5.67 MIL/uL — ABNORMAL HIGH (ref 3.87–5.11)
RDW: 17.5 % — ABNORMAL HIGH (ref 11.5–15.5)
WBC: 12.4 10*3/uL — ABNORMAL HIGH (ref 4.0–10.5)
nRBC: 0 % (ref 0.0–0.2)

## 2023-12-30 LAB — PROCALCITONIN: Procalcitonin: <0.1 ng/mL

## 2023-12-30 LAB — LIPID PANEL
Cholesterol: 164 mg/dL (ref 0–200)
HDL: 56 mg/dL (ref >40)
LDL Cholesterol: 94 mg/dL (ref 0–99)
Total CHOL/HDL Ratio: 2.9 ratio
Triglycerides: 68 mg/dL (ref <150)
VLDL: 14 mg/dL (ref 0–40)

## 2023-12-30 LAB — D-DIMER, QUANTITATIVE: D-Dimer, Quant: 0.44 ug{FEU}/mL (ref 0.00–0.50)

## 2023-12-30 LAB — TSH: TSH: 2.583 u[IU]/mL (ref 0.350–4.500)

## 2023-12-30 LAB — LACTIC ACID, PLASMA: Lactic Acid, Venous: 1 mmol/L (ref 0.5–1.9)

## 2023-12-30 LAB — HIV ANTIBODY (ROUTINE TESTING W REFLEX): HIV Screen 4th Generation wRfx: NONREACTIVE

## 2023-12-30 LAB — MAGNESIUM: Magnesium: 2.9 mg/dL — ABNORMAL HIGH (ref 1.7–2.4)

## 2023-12-30 LAB — PHOSPHORUS: Phosphorus: 3.5 mg/dL (ref 2.5–4.6)

## 2023-12-30 MED ORDER — BENZONATATE 100 MG PO CAPS
200.0000 mg | ORAL_CAPSULE | Freq: Three times a day (TID) | ORAL | Status: DC
  Administered 2023-12-30 – 2024-01-06 (×23): 200 mg via ORAL
  Filled 2023-12-30 (×23): qty 2

## 2023-12-30 MED ORDER — DONEPEZIL HCL 5 MG PO TABS
10.0000 mg | ORAL_TABLET | Freq: Every day | ORAL | Status: DC
  Administered 2023-12-30 – 2024-01-06 (×8): 10 mg via ORAL
  Filled 2023-12-30 (×8): qty 2

## 2023-12-30 MED ORDER — IPRATROPIUM BROMIDE 0.02 % IN SOLN
0.5000 mg | Freq: Four times a day (QID) | RESPIRATORY_TRACT | Status: DC
  Administered 2023-12-30: 0.5 mg via RESPIRATORY_TRACT
  Filled 2023-12-30 (×2): qty 2.5

## 2023-12-30 MED ORDER — HEPARIN SODIUM (PORCINE) 5000 UNIT/ML IJ SOLN
5000.0000 [IU] | Freq: Three times a day (TID) | INTRAMUSCULAR | Status: DC
  Administered 2023-12-30 – 2024-01-06 (×22): 5000 [IU] via SUBCUTANEOUS
  Filled 2023-12-30 (×22): qty 1

## 2023-12-30 MED ORDER — SODIUM CHLORIDE 0.9% FLUSH: 3.0000 mL | Freq: Two times a day (BID) | INTRAVENOUS | Status: DC

## 2023-12-30 MED ORDER — METHYLPREDNISOLONE SODIUM SUCC 125 MG IJ SOLR
80.0000 mg | Freq: Two times a day (BID) | INTRAMUSCULAR | Status: DC
  Filled 2023-12-30 (×2): qty 2

## 2023-12-30 MED ORDER — LEVOFLOXACIN 500 MG PO TABS: 500.0000 mg | ORAL_TABLET | Freq: Every day | ORAL | Status: DC

## 2023-12-30 MED ORDER — FLEET ENEMA RE ENEM: 1.0000 | ENEMA | Freq: Once | RECTAL | Status: DC | PRN

## 2023-12-30 MED ORDER — ASPIRIN 81 MG PO TBEC
81.0000 mg | DELAYED_RELEASE_TABLET | Freq: Every day | ORAL | Status: DC
  Administered 2023-12-31 – 2024-01-06 (×7): 81 mg via ORAL
  Filled 2023-12-30 (×7): qty 1

## 2023-12-30 MED ORDER — AMLODIPINE BESYLATE 5 MG PO TABS
10.0000 mg | ORAL_TABLET | Freq: Every day | ORAL | Status: DC
  Administered 2023-12-30 – 2024-01-06 (×7): 10 mg via ORAL
  Filled 2023-12-30 (×8): qty 2

## 2023-12-30 MED ORDER — TRAZODONE HCL 50 MG PO TABS
25.0000 mg | ORAL_TABLET | Freq: Every evening | ORAL | Status: DC | PRN
  Administered 2024-01-04: 25 mg via ORAL
  Filled 2023-12-30 (×2): qty 1

## 2023-12-30 MED ORDER — HYDRALAZINE HCL 20 MG/ML IJ SOLN: 10.0000 mg | INTRAMUSCULAR | Status: DC | PRN

## 2023-12-30 MED ORDER — ONDANSETRON HCL 4 MG/2ML IJ SOLN: 4.0000 mg | Freq: Four times a day (QID) | INTRAMUSCULAR | Status: DC | PRN

## 2023-12-30 MED ORDER — LEVOFLOXACIN 500 MG PO TABS
500.0000 mg | ORAL_TABLET | Freq: Every day | ORAL | Status: DC
  Administered 2023-12-31: 500 mg via ORAL
  Filled 2023-12-30: qty 1

## 2023-12-30 MED ORDER — MAGNESIUM SULFATE 2 GM/50ML IV SOLN
2.0000 g | Freq: Once | INTRAVENOUS | Status: AC
  Administered 2023-12-30: 2 g via INTRAVENOUS
  Filled 2023-12-30: qty 50

## 2023-12-30 MED ORDER — LEVALBUTEROL HCL 0.63 MG/3ML IN NEBU
0.6300 mg | INHALATION_SOLUTION | Freq: Four times a day (QID) | RESPIRATORY_TRACT | Status: DC
  Administered 2023-12-30: 0.63 mg via RESPIRATORY_TRACT
  Filled 2023-12-30 (×2): qty 3

## 2023-12-30 MED ORDER — LEVOFLOXACIN IN D5W 500 MG/100ML IV SOLN
500.0000 mg | Freq: Once | INTRAVENOUS | Status: AC
  Administered 2023-12-30: 500 mg via INTRAVENOUS
  Filled 2023-12-30: qty 100

## 2023-12-30 MED ORDER — ACETAMINOPHEN 650 MG RE SUPP: 650.0000 mg | Freq: Four times a day (QID) | RECTAL | Status: DC | PRN

## 2023-12-30 MED ORDER — IPRATROPIUM BROMIDE 0.02 % IN SOLN
0.5000 mg | Freq: Three times a day (TID) | RESPIRATORY_TRACT | Status: DC
  Administered 2023-12-31 – 2024-01-03 (×12): 0.5 mg via RESPIRATORY_TRACT
  Filled 2023-12-30 (×12): qty 2.5

## 2023-12-30 MED ORDER — OXYCODONE HCL 5 MG PO TABS
5.0000 mg | ORAL_TABLET | ORAL | Status: DC | PRN
  Administered 2023-12-30 – 2024-01-02 (×6): 5 mg via ORAL
  Filled 2023-12-30 (×8): qty 1

## 2023-12-30 MED ORDER — VITAMIN B-12 1000 MCG PO TABS: 1000.0000 ug | ORAL_TABLET | Freq: Every day | ORAL | Status: DC

## 2023-12-30 MED ORDER — HYDROCOD POLI-CHLORPHE POLI ER 10-8 MG/5ML PO SUER
5.0000 mL | Freq: Two times a day (BID) | ORAL | Status: DC
  Administered 2023-12-30 – 2024-01-06 (×12): 5 mL via ORAL
  Filled 2023-12-30 (×14): qty 5

## 2023-12-30 MED ORDER — LEVALBUTEROL HCL 0.63 MG/3ML IN NEBU
0.6300 mg | INHALATION_SOLUTION | Freq: Three times a day (TID) | RESPIRATORY_TRACT | Status: DC
  Administered 2023-12-31 – 2024-01-03 (×12): 0.63 mg via RESPIRATORY_TRACT
  Filled 2023-12-30 (×12): qty 3

## 2023-12-30 MED ORDER — GUAIFENESIN-DM 100-10 MG/5ML PO SYRP
10.0000 mL | ORAL_SOLUTION | Freq: Three times a day (TID) | ORAL | Status: DC
  Administered 2023-12-31 – 2024-01-06 (×20): 10 mL via ORAL
  Filled 2023-12-30 (×22): qty 10

## 2023-12-30 MED ORDER — ACETAMINOPHEN 325 MG PO TABS: 650.0000 mg | ORAL_TABLET | Freq: Four times a day (QID) | ORAL | Status: DC | PRN

## 2023-12-30 MED ORDER — HYDROMORPHONE HCL 1 MG/ML IJ SOLN
0.5000 mg | INTRAMUSCULAR | Status: DC | PRN
  Administered 2023-12-31: 1 mg via INTRAVENOUS
  Filled 2023-12-30: qty 1

## 2023-12-30 MED ORDER — METOPROLOL TARTRATE 50 MG PO TABS
50.0000 mg | ORAL_TABLET | Freq: Two times a day (BID) | ORAL | Status: DC
  Administered 2023-12-31 – 2024-01-04 (×6): 50 mg via ORAL
  Filled 2023-12-30 (×8): qty 1

## 2023-12-30 MED ORDER — BISACODYL 5 MG PO TBEC: 5.0000 mg | DELAYED_RELEASE_TABLET | Freq: Every day | ORAL | Status: DC | PRN

## 2023-12-30 MED ORDER — SENNOSIDES-DOCUSATE SODIUM 8.6-50 MG PO TABS: 1.0000 | ORAL_TABLET | Freq: Every evening | ORAL | Status: DC | PRN

## 2023-12-30 MED ORDER — BUDESONIDE 0.5 MG/2ML IN SUSP
0.5000 mg | Freq: Two times a day (BID) | RESPIRATORY_TRACT | Status: DC
  Administered 2023-12-30 – 2024-01-06 (×15): 0.5 mg via RESPIRATORY_TRACT
  Filled 2023-12-30 (×16): qty 2

## 2023-12-30 MED ORDER — SODIUM CHLORIDE 0.9% FLUSH
3.0000 mL | Freq: Two times a day (BID) | INTRAVENOUS | Status: DC
Start: 2023-12-30 — End: 2024-01-07
  Administered 2024-01-01 – 2024-01-02 (×2): 3 mL via INTRAVENOUS
  Administered 2024-01-02: 10 mL via INTRAVENOUS
  Administered 2024-01-03: 3 mL via INTRAVENOUS
  Administered 2024-01-03 – 2024-01-04 (×2): 10 mL via INTRAVENOUS
  Administered 2024-01-05 – 2024-01-06 (×2): 3 mL via INTRAVENOUS

## 2023-12-30 MED ORDER — SODIUM CHLORIDE 0.9% FLUSH
3.0000 mL | Freq: Two times a day (BID) | INTRAVENOUS | Status: DC
  Administered 2023-12-31 – 2024-01-06 (×13): 3 mL via INTRAVENOUS

## 2023-12-30 MED ORDER — LEVOTHYROXINE SODIUM 50 MCG PO TABS
50.0000 ug | ORAL_TABLET | Freq: Every day | ORAL | Status: DC
  Administered 2023-12-31 – 2024-01-06 (×7): 50 ug via ORAL
  Filled 2023-12-30 (×7): qty 1

## 2023-12-30 MED ORDER — MEMANTINE HCL 10 MG PO TABS
10.0000 mg | ORAL_TABLET | Freq: Two times a day (BID) | ORAL | Status: DC
  Administered 2023-12-30 – 2024-01-06 (×15): 10 mg via ORAL
  Filled 2023-12-30 (×15): qty 1

## 2023-12-30 MED ORDER — ONDANSETRON HCL 4 MG PO TABS: 4.0000 mg | ORAL_TABLET | Freq: Four times a day (QID) | ORAL | Status: DC | PRN

## 2023-12-30 NOTE — Progress Notes (Signed)
 [facility] explained to pt and family. Info card given.

## 2023-12-30 NOTE — Assessment & Plan Note (Signed)
 Continue PPI ?

## 2023-12-30 NOTE — Assessment & Plan Note (Signed)
 Continue Synthroid

## 2023-12-30 NOTE — Assessment & Plan Note (Signed)
-   Stable, continue home medication of Norvasc and Lopressor

## 2023-12-30 NOTE — Assessment & Plan Note (Signed)
-  Will be admitted in telemetry floor for close observation -Acute respiratory failure likely due to COPD exacerbation -Currently satting 97% on 2 L of oxygen, planning to wean off to room air Patient is not O2 dependent at baseline. (History of remote tobacco abuse, quit 7 years ago) -Patient has received IV steroids, IV antibiotics of Levaquin -Continue scheduled and as needed DuoNeb bronchodilators -Pending IV steroids -Antibiotics -Obtaining lactic acid and procalcitonin level -Chest x-ray clear, D-dimer negative at 0.44

## 2023-12-30 NOTE — Plan of Care (Signed)

## 2023-12-30 NOTE — Assessment & Plan Note (Signed)
 History of dementia, continue home medication of Aricept and Namenda

## 2023-12-30 NOTE — H&P (Signed)
 History and Physical   Patient: Stephanie Howe                            PCP: Pcp, No                    DOB: July 07, 1961            DOA: 12/30/2023 ZOX:[id number]             DOS: 12/30/2023, 12:31 PM  Pcp, No  Patient coming from:   HOME  I have personally reviewed patient's medical records, in electronic medical records, including:  [place] link, and care everywhere.    Chief Complaint:   Chief Complaint  Patient presents with   Shortness of Breath    History of present illness:    Stephanie Howe is a 63-year-old female with significant history of COPD with history of tobacco abuse (7 years ago), hypertension, hyperlipidemia, hypothyroidism, GERD, memory deficit, poor historian.... Presented to the ED with chief complaint of shortness of breath, apparently patient was satting in low 90s on room air, improved with 2 L of oxygen to 97%.    ED evaluation: Blood pressure (!) 158/72, pulse (!) 59, temperature 98.8 F (37.1 C), resp. rate (!) 22, height 5\' 1"  (1.549 m), weight 59.7 kg, SpO2 97%.  On 2 L  Labs: CBC WBC 12.4, MCV 76.4, MCH 22.6, RDW 17.4, platelets 411, neutrophil 10.5, eosinophil 0.3, BMP within normal limits, troponin 12, LFTs within normal limits, D-dimer negative at 0.44,  Chest x-ray per radiology-clear, Patient was treated with nebs, IV steroids, IV antibiotics Levaquin Requested to be admitted for COPD exacerbation     Patient Denies having: Fever, Chills, Cough, SOB, Chest Pain, Abd pain, N/V/D, headache, dizziness, lightheadedness,  Dysuria, Joint pain, rash, open wounds     Review of Systems: As per HPI, otherwise 10 point review of systems were negative.   ----------------------------------------------------------------------------------------------------------------------  Allergies  Allergen Reactions   Codeine    Penicillins     Home MEDs:  Prior to Admission medications   Medication Sig Start Date End Date Taking? Authorizing  Provider  amLODipine (NORVASC) 10 MG tablet Take 1 tablet by mouth once daily 11/25/22   Jacquelin Hawking, PA-C  aspirin EC 81 MG tablet Take 81 mg by mouth daily. Swallow whole.    [provider]  augmented betamethasone dipropionate (DIPROLENE AF) 0.05 % cream Apply topically 2 (two) times daily. Patient not taking: Reported on 07/16/2022 04/09/22   Jacquelin Hawking, PA-C  cyclobenzaprine (FLEXERIL) 10 MG tablet Take 1 tablet (10 mg total) by mouth 2 (two) times daily as needed for muscle spasms. Patient not taking: Reported on 07/16/2022 06/08/22   Carroll Sage, PA-C  donepezil (ARICEPT) 10 MG tablet Take 1 tablet (10 mg total) by mouth daily. 10/22/22   Ocie Doyne, MD  levothyroxine (SYNTHROID) 50 MCG tablet TAKE 1 & 1/2 Tablets BY MOUTH ONCE EVERY DAY ON MONDAY, WEDNESDAY AND FRIDAY AND TAKE 1 Tablet BY MOUTH ONCE EVERY DAY ON SUNDAY, TUESDAY, THURSDAY, AND SATURDAY 11/25/22   Jacquelin Hawking, PA-C  memantine (NAMENDA) 10 MG tablet Take 1 tablet (10 mg total) by mouth 2 (two) times daily. 11/25/22   Ocie Doyne, MD  metoprolol tartrate (LOPRESSOR) 50 MG tablet Take 1 tablet (50 mg total) by mouth 2 (two) times daily. 11/25/22   Jacquelin Hawking, PA-C  vitamin B-12 (CYANOCOBALAMIN) 1000 MCG tablet Take 1,000 mcg by  mouth daily.    [provider]    PRN MEDs: acetaminophen **OR** acetaminophen, bisacodyl, hydrALAZINE, HYDROmorphone (DILAUDID) injection, ondansetron **OR** ondansetron (ZOFRAN) IV, oxyCODONE, senna-docusate, sodium phosphate, traZODone  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   Qualifier: Diagnosis of  By: Romeo Apple MD, [place]     Heartburn 07/03/2016   Hx of iron deficiency anemia 11/15/2016   Hypertension    Hypothyroidism    Overweight    Vitamin D deficiency disease     Past Surgical History:  Procedure Laterality Date    ABDOMINAL HYSTERECTOMY  1994   BREAST EXCISIONAL BIOPSY Left 10+yrs ago   neg   COLON SURGERY  2002   colon exploratory surgery   TONSILLECTOMY  1984     reports that she quit smoking about 12 years ago. Her smoking use included cigarettes. She started smoking about 42 years ago. She has a 45 pack-year smoking history. She has never used smokeless tobacco. She reports that she does not drink alcohol and does not use drugs.   Family History  Problem Relation Age of Onset   COPD Mother    Hypertension Mother    Pulmonary embolism Father    Diabetes Father    COPD Father    Hypertension Brother    Stroke Maternal Grandmother    Cancer Neg Hx    Heart disease Neg Hx    Breast cancer Neg Hx     Physical Exam:   Vitals:   12/30/23 1000 12/30/23 1030 12/30/23 1100 12/30/23 1130  BP: (!) 143/80 135/71 (!) 147/76 (!) 158/72  Pulse: 68 65 72 (!) 59  Resp: (!) 29 (!) 23 16 (!) 22  Temp:      SpO2: 96% 95% 97% 97%  Weight:      Height:       Constitutional: NAD, calm, comfortable pleasantly confused Eyes: PERRL, lids and conjunctivae normal ENMT: Mucous membranes are moist. Posterior pharynx clear of any exudate or lesions.Normal dentition.  Neck: normal, supple, no masses, no thyromegaly Respiratory: Diffuse wheezing, rales or rhonchi, negative any crackles, Cardiovascular: Regular rate and rhythm, no murmurs / rubs / gallops. No extremity edema. 2+ pedal pulses. No carotid bruits.  Abdomen: no tenderness, no masses palpated. No hepatosplenomegaly. Bowel sounds positive.  Musculoskeletal: no clubbing / cyanosis. No joint deformity upper and lower extremities. Good ROM, no contractures. Normal muscle tone.  Neurologic: CN II-XII grossly intact. Sensation intact, DTR normal. Strength 5/5 in all 4.  Psychiatric: Normal judgment and insight. Alert and oriented x 3. Normal mood.  Skin: no rashes, lesions, ulcers. No induration Decubitus/ulcers:  Wounds: per nursing documentation       Labs on admission:    I have personally reviewed following labs and imaging studies  CBC: Recent Labs  Lab 12/30/23 0855  WBC 12.4*  NEUTROABS 10.5*  HGB 12.8  HCT 43.3  MCV 76.4*  PLT 411*   Basic Metabolic Panel: Recent Labs  Lab 12/30/23 0855  NA 140  K 3.9  CL 102  CO2 27  GLUCOSE 113*  BUN 12  CREATININE 0.75  CALCIUM 9.3   GFR: Estimated Creatinine Clearance: 59.8 mL/min (by C-G formula based on SCr of 0.75 mg/dL). Liver Function Tests: Recent Labs  Lab 12/30/23 0855  AST 16  ALT 9  ALKPHOS 75  BILITOT 0.5  PROT 7.4  ALBUMIN 3.8  Urine analysis:    Component Value Date/Time   COLORURINE STRAW (A) 06/08/2022 1806   APPEARANCEUR CLEAR 06/08/2022 1806   APPEARANCEUR Clear 11/27/2015 0928   LABSPEC 1.006 06/08/2022 1806   LABSPEC 1.008 11/30/2013 1250   PHURINE 7.0 06/08/2022 1806   GLUCOSEU NEGATIVE 06/08/2022 1806   GLUCOSEU Negative 11/30/2013 1250   HGBUR NEGATIVE 06/08/2022 1806   BILIRUBINUR NEGATIVE 06/08/2022 1806   BILIRUBINUR Negative 11/27/2015 0928   BILIRUBINUR Negative 11/30/2013 1250   KETONESUR NEGATIVE 06/08/2022 1806   PROTEINUR NEGATIVE 06/08/2022 1806   UROBILINOGEN 0.2 07/17/2007 1654   NITRITE NEGATIVE 06/08/2022 1806   LEUKOCYTESUR NEGATIVE 06/08/2022 1806   LEUKOCYTESUR Negative 11/30/2013 1250    Last A1C:  Lab Results  Component Value Date   HGBA1C 5.9 02/08/2015     Radiologic Exams on Admission:   DG Chest Port 1 View Result Date: 12/30/2023 CLINICAL DATA:  Shortness of breath, cough. EXAM: PORTABLE CHEST 1 VIEW COMPARISON:  December 02, 2023. FINDINGS: The heart size and mediastinal contours are within normal limits. Both lungs are clear. The visualized skeletal structures are unremarkable. IMPRESSION: No active disease. Electronically Signed   By: Lupita Raider M.D.   On: 12/30/2023 10:46    EKG:   Independently reviewed.  Orders placed or performed during the hospital encounter of 12/30/23    EKG 12-Lead   ---------------------------------------------------------------------------------------------------------------------------------------    Assessment / Plan:   Principal Problem:   Acute respiratory failure (HCC) Active Problems:   COPD (chronic obstructive pulmonary disease) (HCC)   Essential hypertension   Hypothyroidism   Hyperlipidemia   Gastroesophageal reflux disease with esophagitis   Vitamin D deficiency disease   Memory impairment   Assessment and Plan: * Acute respiratory failure (HCC) -Will be admitted in telemetry floor for close observation -Acute respiratory failure likely due to COPD exacerbation -Currently satting 97% on 2 L of oxygen, planning to wean off to room air Patient is not O2 dependent at baseline. (History of remote tobacco abuse, quit 7 years ago) -Patient has received IV steroids, IV antibiotics of Levaquin -Continue scheduled and as needed DuoNeb bronchodilators -Pending IV steroids -Antibiotics -Obtaining lactic acid and procalcitonin level -Chest x-ray clear, D-dimer negative at 0.44  COPD (chronic obstructive pulmonary disease) (HCC) -COPD exacerbation, treatment as above  Hypothyroidism Continue Synthroid  Essential hypertension - Stable, continue home medication of Norvasc and Lopressor  Gastroesophageal reflux disease with esophagitis - Continue PPI  Hyperlipidemia - Will continue statins  Memory impairment History of dementia, continue home medication of Aricept and Namenda  Vitamin D deficiency disease Continue vitamin D supplements      Consults called:  None -------------------------------------------------------------------------------------------------------------------------------------------- DVT prophylaxis:  heparin injection 5,000 Units Start: 12/30/23 2200 TED hose Start: 12/30/23 1216 SCDs Start: 12/30/23 1216   Code Status:   Code Status: Full Code   Admission status: Patient will be  admitted as Inpatient, with a greater than 2 midnight length of stay. Level of care: Telemetry   Family Communication:  none at bedside  (The above findings and plan of care has been discussed with patient in detail, the patient expressed understanding and agreement of above plan)  --------------------------------------------------------------------------------------------------------------------------------------------------  Disposition Plan:  Anticipated 1-2 days Status is: Inpatient Remains inpatient appropriate because: Needing respiratory support, supplemental oxygen, nebs, IV steroids     ----------------------------------------------------------------------------------------------------------------------------------------------------  Time spent:  53  Min.  Was spent seeing and evaluating the patient, reviewing all medical records, drawn plan of care.  SIGNED: Kendell Bane, MD, FHM. FAAFP.  [place] [facility], Pager  (Please use [email or web address] to page/ or secure chat through epic) If 7PM-7AM, please contact night-coverage [email or web address],  12/30/2023, 12:31 PM

## 2023-12-30 NOTE — ED Triage Notes (Signed)
 Patient BIB [facility] from home for complain of Shortness of breath and painful cough. EMS noted Rhonchi in all lobes. On 2L Silt due to initial o2 sat was 90% is now 97%.  EMS gave 125mg  of solumedrol.

## 2023-12-30 NOTE — Assessment & Plan Note (Signed)
-  COPD exacerbation, treatment as above

## 2023-12-30 NOTE — Assessment & Plan Note (Signed)
 Continue vitamin D supplements.

## 2023-12-30 NOTE — Assessment & Plan Note (Signed)
-  Will continue statins

## 2023-12-30 NOTE — Hospital Course (Signed)
 Stephanie Howe is a 63-year-old female with significant history of COPD with history of tobacco abuse (7 years ago), hypertension, hyperlipidemia, hypothyroidism, GERD, memory deficit, poor historian.... Presented to the ED with chief complaint of shortness of breath, apparently patient was satting in low 90s on room air, improved with 2 L of oxygen to 97%.    ED evaluation: Blood pressure (!) 158/72, pulse (!) 59, temperature 98.8 F (37.1 C), resp. rate (!) 22, height 5\' 1"  (1.549 m), weight 59.7 kg, SpO2 97%.  On 2 L  Labs: CBC WBC 12.4, MCV 76.4, MCH 22.6, RDW 17.4, platelets 411, neutrophil 10.5, eosinophil 0.3, BMP within normal limits, troponin 12, LFTs within normal limits, D-dimer negative at 0.44,  Chest x-ray per radiology-clear, Patient was treated with nebs, IV steroids, IV antibiotics Levaquin Requested to be admitted for COPD exacerbation

## 2023-12-30 NOTE — TOC Initial Note (Signed)
 Transition of Care [facility]) - Initial/Assessment Note    Patient Details  Name: Stephanie Howe MRN: [id number] Date of Birth: [birth year]  Transition of Care [facility]) CM/SW Contact:    Barron Alvine, RN Phone Number: 12/30/2023, 6:19 PM  Clinical Narrative:                 Pt s/PCP. Explained CareConnect and eligibility requirements to pt and family. TOC to follow.   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   Memory impairment 02/25/2022   Aortic atherosclerosis (HCC) 05/18/2019   Gastroesophageal reflux disease with esophagitis 05/18/2019   Hx of tobacco use, presenting hazards to health 07/03/2016   Early menopause 07/03/2016   Hyperlipidemia 12/27/2015   Essential hypertension    COPD (chronic obstructive pulmonary disease) (HCC)    Hypothyroidism    Vitamin D deficiency disease    PCP:  Pcp, No Pharmacy:   [facility] [address], [place] - [address] [place] [address] Phone: [phone] Fax: [phone]  [facility], [place] - [address] [place], [place] [address] [place] [address] Phone: [phone] Fax: [phone]  SDOH Screenings   Food Insecurity: Patient Declined (12/30/2023)  Housing: Patient Declined (12/30/2023)  Transportation Needs: Patient Declined (12/30/2023)  Utilities: Patient Declined (12/30/2023)  Alcohol Screen: Low Risk  (10/24/2020)   Depression (PHQ2-9): Low Risk  (10/24/2020)  Tobacco Use: Medium Risk (12/30/2023)   Readmission Risk Interventions    12/30/2023    6:11 PM  Readmission Risk Prevention Plan  Post Dischage Appt Complete  Medication Screening Complete  Transportation Screening Complete

## 2023-12-30 NOTE — ED Notes (Signed)
 Report given and accepted by receiving nurse on 300. Transport team called to transport patient.

## 2023-12-31 LAB — BASIC METABOLIC PANEL WITH GFR
Anion gap: 11 (ref 5–15)
BUN: 17 mg/dL (ref 8–23)
CO2: 24 mmol/L (ref 22–32)
Calcium: 9.1 mg/dL (ref 8.9–10.3)
Chloride: 98 mmol/L (ref 98–111)
Creatinine, Ser: 1 mg/dL (ref 0.44–1.00)
GFR, Estimated: >60 mL/min
Glucose, Bld: 151 mg/dL — ABNORMAL HIGH (ref 70–99)
Potassium: 4.4 mmol/L (ref 3.5–5.1)
Sodium: 133 mmol/L — ABNORMAL LOW (ref 135–145)

## 2023-12-31 LAB — CBC
HCT: 38.9 % (ref 36.0–46.0)
Hemoglobin: 11.6 g/dL — ABNORMAL LOW (ref 12.0–15.0)
MCH: 22.7 pg — ABNORMAL LOW (ref 26.0–34.0)
MCHC: 29.8 g/dL — ABNORMAL LOW (ref 30.0–36.0)
MCV: 76.3 fL — ABNORMAL LOW (ref 80.0–100.0)
Platelets: 434 10*3/uL — ABNORMAL HIGH (ref 150–400)
RBC: 5.1 MIL/uL (ref 3.87–5.11)
RDW: 16.1 % — ABNORMAL HIGH (ref 11.5–15.5)
WBC: 19.3 10*3/uL — ABNORMAL HIGH (ref 4.0–10.5)
nRBC: 0 % (ref 0.0–0.2)

## 2023-12-31 LAB — GLUCOSE, CAPILLARY: Glucose-Capillary: 142 mg/dL — ABNORMAL HIGH (ref 70–99)

## 2023-12-31 MED ORDER — PREDNISONE 20 MG PO TABS
50.0000 mg | ORAL_TABLET | Freq: Every day | ORAL | Status: DC
  Administered 2023-12-31: 50 mg via ORAL
  Filled 2023-12-31: qty 1

## 2023-12-31 MED ORDER — METHYLPREDNISOLONE SODIUM SUCC 40 MG IJ SOLR
40.0000 mg | Freq: Two times a day (BID) | INTRAMUSCULAR | Status: DC
  Administered 2023-12-31 – 2024-01-01 (×2): 40 mg via INTRAVENOUS
  Filled 2023-12-31 (×2): qty 1

## 2023-12-31 NOTE — Progress Notes (Signed)
 PROGRESS NOTE    Patient: Stephanie Howe                            PCP: Pcp, No                    DOB: [birth year]            DOA: 12/30/2023 EXB:[id number]             DOS: 12/31/2023, 10:41 AM   LOS: 1 day   Date of Service: The patient was seen and examined on 12/31/2023  Subjective:   The patient was seen and examined this morning. Hemodynamically stable.  Heart rate of 58, blood pressure 148/84, on room air patient was satting 86% currently on 1.5-2 L of oxygen, satting 94% Still complaining of mild shortness of breath at rest, extensive shortness of breath on minimal exertion, Severe bilateral flank pain especially with cough and exertion   Brief Narrative:   Stephanie Howe is a 63-year-old female with significant history of COPD with history of tobacco abuse (7 years ago), hypertension, hyperlipidemia, hypothyroidism, GERD, memory deficit, poor historian.... Presented to the ED with chief complaint of shortness of breath, apparently patient was satting in low 90s on room air, improved with 2 L of oxygen to 97%.    ED evaluation: Blood pressure (!) 158/72, pulse (!) 59, temperature 98.8 F (37.1 C), resp. rate (!) 22, height 5\' 1"  (1.549 m), weight 59.7 kg, SpO2 97%.  On 2 L  Labs: CBC WBC 12.4, MCV 76.4, MCH 22.6, RDW 17.4, platelets 411, neutrophil 10.5, eosinophil 0.3, BMP within normal limits, troponin 12, LFTs within normal limits, D-dimer negative at 0.44,  Chest x-ray per radiology-clear, Patient was treated with nebs, IV steroids, IV antibiotics Levaquin Requested to be admitted for COPD exacerbation       Assessment & Plan:   Principal Problem:   Acute respiratory failure (HCC) Active Problems:   COPD (chronic obstructive pulmonary disease) (HCC)   Essential hypertension   Hypothyroidism   Hyperlipidemia   Gastroesophageal reflux disease with esophagitis   Vitamin D deficiency disease   Memory impairment     Assessment and Plan: * Acute respiratory  failure (HCC) -Still satting 86% on room air, requiring minimum of 1.5-2 L of oxygen to maintain O2 sat of 94% -Shortness of breath at rest, extensive shortness of breath and cough with minimal exertion -Bilateral costophrenic pain with exertion and cough  Acute respiratory failure likely due to COPD exacerbation -Currently on 1.5 L of oxygen, satting 94% Patient is not O2 dependent at baseline. (History of remote tobacco abuse, quit 7 years ago) -Will continue IV steroids, IV antibiotics of Levaquin -Will continue scheduled and as needed DuoNeb bronchodilators -Pending IV steroids -WBC 19.3, procalcitonin<0.10, lactic acid 1.0 -Chest x-ray clear, D-dimer negative at 0.44  COPD (chronic obstructive pulmonary disease) (HCC) -COPD exacerbation, treatment as above  Hypothyroidism Continue Synthroid  Essential hypertension - Stable, continue home medication of Norvasc and Lopressor  Gastroesophageal reflux disease with esophagitis - Continue PPI  Hyperlipidemia - Will continue statins  Memory impairment-dementia -Will continue  Aricept and Namenda  Vitamin D deficiency disease Continue vitamin D supplements -Vitamin D levels   ----------------------------------------------------------------------------------------------------------------------------------------------- Nutritional status:  The patient's BMI is: Body mass index is 24.33 kg/m. I agree with the assessment and plan as outlined.   ------------------------------------------------------------------------------------------------------------------------------------------------  DVT prophylaxis:  heparin injection 5,000 Units Start: 12/30/23 2200 TED hose  Start: 12/30/23 1216 SCDs Start: 12/30/23 1216   Code Status:   Code Status: Full Code  Family Communication: Family members present at bedside-updated -Advance care planning has been discussed.   Admission status:   Status is: Inpatient Remains inpatient  appropriate because: Needing respiratory support, scheduled breathing treatment, IV steroids   Disposition: From  - home             Planning for discharge in 1 day  to home w [facility]   Procedures:   No admission procedures for hospital encounter.   Antimicrobials:  Anti-infectives (From admission, onward)    Start     Dose/Rate Route Frequency Ordered Stop   12/31/23 1000  levofloxacin (LEVAQUIN) tablet 500 mg        500 mg Oral Daily 12/30/23 1339     12/30/23 1345  levofloxacin (LEVAQUIN) tablet 500 mg  Status:  Discontinued        500 mg Oral Daily 12/30/23 1337 12/30/23 1339   12/30/23 1145  levofloxacin (LEVAQUIN) IVPB 500 mg        500 mg 100 mL/hr over 60 Minutes Intravenous  Once 12/30/23 1143 12/30/23 1330        Medication:   amLODipine  10 mg Oral Daily   aspirin EC  81 mg Oral Daily   benzonatate  200 mg Oral TID   budesonide (PULMICORT) nebulizer solution  0.5 mg Nebulization BID   chlorpheniramine-HYDROcodone  5 mL Oral Q12H   donepezil  10 mg Oral QHS   guaiFENesin-dextromethorphan  10 mL Oral Q8H   heparin  5,000 Units Subcutaneous Q8H   ipratropium  0.5 mg Nebulization TID   levalbuterol  0.63 mg Nebulization TID   levofloxacin  500 mg Oral Daily   levothyroxine  50 mcg Oral Q0600   memantine  10 mg Oral BID   methylPREDNISolone (SOLU-MEDROL) injection  40 mg Intravenous Q12H   metoprolol tartrate  50 mg Oral BID   sodium chloride flush  3 mL Intravenous Q12H   sodium chloride flush  3-10 mL Intravenous Q12H    acetaminophen **OR** acetaminophen, bisacodyl, hydrALAZINE, HYDROmorphone (DILAUDID) injection, ondansetron **OR** ondansetron (ZOFRAN) IV, oxyCODONE, senna-docusate, sodium phosphate, traZODone   Objective:   Vitals:   12/31/23 0552 12/31/23 0829 12/31/23 0932 12/31/23 0933  BP: 120/62 (!) 148/84    Pulse: (!) 52 (!) 58    Resp: 18     Temp: 99.2 F (37.3 C) 98.8 F (37.1 C)    TempSrc: Oral Oral    SpO2: 94% 94% (!) 86% 94%  Weight:  58.4 kg     Height:        Intake/Output Summary (Last 24 hours) at 12/31/2023 1041 Last data filed at 12/30/2023 1900 Gross per 24 hour  Intake 372.32 ml  Output --  Net 372.32 ml   Filed Weights   12/30/23 0826 12/30/23 1300 12/31/23 0552  Weight: 59.7 kg 57.3 kg 58.4 kg     Physical examination:   Constitution: Mild shortness of breath at rest, extensive shortness of breath with minimal exertion,  alert, cooperative, no distress,  Appears calm and comfortable  Psychiatric:   Normal and stable mood and affect, cognition intact,   HEENT:        Normocephalic, PERRL, otherwise with in Normal limits  Chest:         Bilateral costophrenic chest pain with cough, and palpation  chest symmetric Cardio vascular:  S1/S2, RRR, No murmure, No Rubs or Gallops  pulmonary: Diffuse wheezing and rhonchi,, exacerbated in right and left lower lobes With poor air exchange.  Negative for any crackles Abdomen: Soft, non-tender, non-distended, bowel sounds,no masses, no organomegaly Muscular skeletal: Limited exam - in bed, able to move all 4 extremities,   Neuro: CNII-XII intact. , normal motor and sensation, reflexes intact  Extremities: No pitting edema lower extremities, +2 pulses  Skin: Dry, warm to touch, negative for any Rashes, No open wounds Wounds: per nursing documentation   ------------------------------------------------------------------------------------------------------------------------------------------    LABs:     Latest Ref Rng & Units 12/31/2023    5:29 AM 12/30/2023    8:55 AM 12/02/2023    1:18 PM  CBC  WBC 4.0 - 10.5 K/uL 19.3  12.4  12.1   Hemoglobin 12.0 - 15.0 g/dL 16.1  09.6  04.5   Hematocrit 36.0 - 46.0 % 38.9  43.3  43.1   Platelets 150 - 400 K/uL 434  411  365       Latest Ref Rng & Units 12/31/2023    5:29 AM 12/30/2023    8:55 AM 12/02/2023    1:18 PM  CMP  Glucose 70 - 99 mg/dL 409  811  91   BUN 8 - 23 mg/dL 17  12  10    Creatinine  0.44 - 1.00 mg/dL 9.14  7.82  9.56   Sodium 135 - 145 mmol/L 133  140  140   Potassium 3.5 - 5.1 mmol/L 4.4  3.9  3.5   Chloride 98 - 111 mmol/L 98  102  103   CO2 22 - 32 mmol/L 24  27  26    Calcium 8.9 - 10.3 mg/dL 9.1  9.3  9.2   Total Protein 6.5 - 8.1 g/dL  7.4    Total Bilirubin 0.0 - 1.2 mg/dL  0.5    Alkaline Phos 38 - 126 U/L  75    AST 15 - 41 U/L  16    ALT 0 - 44 U/L  9         Micro Results Recent Results (from the past 240 hours)  Resp panel by RT-PCR (RSV, Flu A&B, Covid) Anterior Nasal Swab     Status: None   Collection Time: 12/30/23 12:24 PM   Specimen: Anterior Nasal Swab  Result Value Ref Range Status   SARS Coronavirus 2 by RT PCR NEGATIVE NEGATIVE Final    Comment: (NOTE) SARS-CoV-2 target nucleic acids are NOT DETECTED.  The SARS-CoV-2 RNA is generally detectable in upper respiratory specimens during the acute phase of infection. The lowest concentration of SARS-CoV-2 viral copies this assay can detect is 138 copies/mL. A negative result does not preclude SARS-Cov-2 infection and should not be used as the sole basis for treatment or other patient management decisions. A negative result may occur with  improper specimen collection/handling, submission of specimen other than nasopharyngeal swab, presence of viral mutation(s) within the areas targeted by this assay, and inadequate number of viral copies(<138 copies/mL). A negative result must be combined with clinical observations, patient history, and epidemiological information. The expected result is Negative.  Fact Sheet for Patients:  BloggerCourse.com  Fact Sheet for Healthcare Providers:  SeriousBroker.it  This test is no t yet approved or cleared by the [place] FDA and  has been authorized for detection and/or diagnosis of SARS-CoV-2 by FDA under an Emergency Use Authorization (EUA). This EUA will remain  in effect (meaning  this test can  be used) for the duration of the COVID-19 declaration under Section 564(b)(1) of the Act, 21 U.S.C.section 360bbb-3(b)(1), unless the authorization is terminated  or revoked sooner.       Influenza A by PCR NEGATIVE NEGATIVE Final   Influenza B by PCR NEGATIVE NEGATIVE Final    Comment: (NOTE) The Xpert Xpress SARS-CoV-2/FLU/RSV plus assay is intended as an aid in the diagnosis of influenza from Nasopharyngeal swab specimens and should not be used as a sole basis for treatment. Nasal washings and aspirates are unacceptable for Xpert Xpress SARS-CoV-2/FLU/RSV testing.  Fact Sheet for Patients: BloggerCourse.com  Fact Sheet for Healthcare Providers: SeriousBroker.it  This test is not yet approved or cleared by the [place] FDA and has been authorized for detection and/or diagnosis of SARS-CoV-2 by FDA under an Emergency Use Authorization (EUA). This EUA will remain in effect (meaning this test can be used) for the duration of the COVID-19 declaration under Section 564(b)(1) of the Act, 21 U.S.C. section 360bbb-3(b)(1), unless the authorization is terminated or revoked.     Resp Syncytial Virus by PCR NEGATIVE NEGATIVE Final    Comment: (NOTE) Fact Sheet for Patients: BloggerCourse.com  Fact Sheet for Healthcare Providers: SeriousBroker.it  This test is not yet approved or cleared by the [place] FDA and has been authorized for detection and/or diagnosis of SARS-CoV-2 by FDA under an Emergency Use Authorization (EUA). This EUA will remain in effect (meaning this test can be used) for the duration of the COVID-19 declaration under Section 564(b)(1) of the Act, 21 U.S.C. section 360bbb-3(b)(1), unless the authorization is terminated or revoked.  Performed at [facility], [address], [place], [place] [address]   Culture, blood (Routine X 2) w Reflex to ID Panel      Status: None (Preliminary result)   Collection Time: 12/30/23 12:55 PM   Specimen: BLOOD  Result Value Ref Range Status   Specimen Description BLOOD BLOOD RIGHT ARM  Final   Special Requests   Final    BOTTLES DRAWN AEROBIC AND ANAEROBIC Blood Culture adequate volume   Culture   Final    NO GROWTH < 24 HOURS Performed at [facility], [address] [place], [place] [address]    Report Status PENDING  Incomplete  Culture, blood (Routine X 2) w Reflex to ID Panel     Status: None (Preliminary result)   Collection Time: 12/30/23 12:55 PM   Specimen: BLOOD  Result Value Ref Range Status   Specimen Description BLOOD RW  Final   Special Requests   Final    BOTTLES DRAWN AEROBIC AND ANAEROBIC Blood Culture adequate volume   Culture   Final    NO GROWTH < 24 HOURS Performed at [facility], [address] [place], [place] [address]    Report Status PENDING  Incomplete    Radiology Reports No results found.  SIGNED: Kendell Bane, MD, FHM. FAAFP. [facility] - [facility] Time spent - 55 min.  In seeing, evaluating and examining the patient. Reviewing medical records, labs, drawn plan of care. [facility],  Pager (please use [email or web address] to page/ text) Please use Epic Secure Chat for non-urgent communication (7AM-7PM)  If 7PM-7AM, please contact night-coverage [email or web address], 12/31/2023, 10:41 AM

## 2023-12-31 NOTE — Plan of Care (Signed)

## 2023-12-31 NOTE — ED Provider Notes (Signed)
 [facility] MEDICAL SURGICAL UNIT Provider Note   CSN: [id number] Arrival date & time: 12/30/23  4098     History  Chief Complaint  Patient presents with   Shortness of Breath    Stephanie Howe is a 63 y.o. female.  Patient has a history of COPD.  She complains of cough and shortness of breath  The history is provided by the patient and medical records. No language interpreter was used.  Shortness of Breath Severity:  Moderate Onset quality:  Sudden Timing:  Constant Progression:  Worsening Chronicity:  New Context: activity   Relieved by:  Nothing Ineffective treatments:  None tried Associated symptoms: no abdominal pain, no chest pain, no cough, no headaches and no rash        Home Medications Prior to Admission medications   Medication Sig Start Date End Date Taking? Authorizing Provider  donepezil (ARICEPT) 10 MG tablet Take 1 tablet (10 mg total) by mouth daily. Patient not taking: Reported on 12/30/2023 10/22/22   Ocie Doyne, MD  levothyroxine (SYNTHROID) 50 MCG tablet TAKE 1 & 1/2 Tablets BY MOUTH ONCE EVERY DAY ON MONDAY, WEDNESDAY AND FRIDAY AND TAKE 1 Tablet BY MOUTH ONCE EVERY DAY ON SUNDAY, TUESDAY, THURSDAY, AND SATURDAY Patient not taking: Reported on 12/30/2023 11/25/22   Jacquelin Hawking, PA-C  memantine (NAMENDA) 10 MG tablet Take 1 tablet (10 mg total) by mouth 2 (two) times daily. Patient not taking: Reported on 12/30/2023 11/25/22   Ocie Doyne, MD  metoprolol tartrate (LOPRESSOR) 50 MG tablet Take 1 tablet (50 mg total) by mouth 2 (two) times daily. Patient not taking: Reported on 12/30/2023 11/25/22   Jacquelin Hawking, PA-C  vitamin B-12 (CYANOCOBALAMIN) 1000 MCG tablet Take 1,000 mcg by mouth daily. Patient not taking: Reported on 12/30/2023    [provider]      Allergies    Codeine and Penicillins    Review of Systems   Review of Systems  Constitutional:  Negative for appetite change and fatigue.  HENT:  Negative for congestion, ear  discharge and sinus pressure.   Eyes:  Negative for discharge.  Respiratory:  Positive for shortness of breath. Negative for cough.   Cardiovascular:  Negative for chest pain.  Gastrointestinal:  Negative for abdominal pain and diarrhea.  Genitourinary:  Negative for frequency and hematuria.  Musculoskeletal:  Negative for back pain.  Skin:  Negative for rash.  Neurological:  Negative for seizures and headaches.  Psychiatric/Behavioral:  Negative for hallucinations.     Physical Exam Updated Vital Signs BP (!) 148/84 (BP Location: Right Arm)   Pulse (!) 58   Temp 98.8 F (37.1 C) (Oral)   Resp 18   Ht 5\' 1"  (1.549 m)   Wt 58.4 kg   SpO2 94%   BMI 24.33 kg/m  Physical Exam Vitals and nursing note reviewed.  Constitutional:      Appearance: She is well-developed.  HENT:     Head: Normocephalic.     Nose: Nose normal.  Eyes:     General: No scleral icterus.    Conjunctiva/sclera: Conjunctivae normal.  Neck:     Thyroid: No thyromegaly.  Cardiovascular:     Rate and Rhythm: Normal rate and regular rhythm.     Heart sounds: No murmur heard.    No friction rub. No gallop.  Pulmonary:     Breath sounds: No stridor. Wheezing present. No rales.  Chest:     Chest wall: No tenderness.  Abdominal:     General:  There is no distension.     Tenderness: There is no abdominal tenderness. There is no rebound.  Musculoskeletal:        General: Normal range of motion.     Cervical back: Neck supple.  Lymphadenopathy:     Cervical: No cervical adenopathy.  Skin:    Findings: No erythema or rash.  Neurological:     Mental Status: She is alert and oriented to person, place, and time.     Motor: No abnormal muscle tone.     Coordination: Coordination normal.  Psychiatric:        Behavior: Behavior normal.     ED Results / Procedures / Treatments   Labs (all labs ordered are listed, but only abnormal results are displayed) Labs Reviewed  CBC WITH DIFFERENTIAL/PLATELET -  Abnormal; Notable for the following components:      Result Value   WBC 12.4 (*)    RBC 5.67 (*)    MCV 76.4 (*)    MCH 22.6 (*)    MCHC 29.6 (*)    RDW 17.5 (*)    Platelets 411 (*)    Neutro Abs 10.5 (*)    Abs Immature Granulocytes 0.10 (*)    All other components within normal limits  COMPREHENSIVE METABOLIC PANEL WITH GFR - Abnormal; Notable for the following components:   Glucose, Bld 113 (*)    All other components within normal limits  MAGNESIUM - Abnormal; Notable for the following components:   Magnesium 2.9 (*)    All other components within normal limits  BASIC METABOLIC PANEL WITH GFR - Abnormal; Notable for the following components:   Sodium 133 (*)    Glucose, Bld 151 (*)    All other components within normal limits  CBC - Abnormal; Notable for the following components:   WBC 19.3 (*)    Hemoglobin 11.6 (*)    MCV 76.3 (*)    MCH 22.7 (*)    MCHC 29.8 (*)    RDW 16.1 (*)    Platelets 434 (*)    All other components within normal limits  GLUCOSE, CAPILLARY - Abnormal; Notable for the following components:   Glucose-Capillary 142 (*)    All other components within normal limits  RESP PANEL BY RT-PCR (RSV, FLU A&B, COVID)  [id number]  CULTURE, BLOOD (ROUTINE X 2)  CULTURE, BLOOD (ROUTINE X 2)  EXPECTORATED SPUTUM ASSESSMENT W GRAM STAIN, RFLX TO RESP C  D-DIMER, QUANTITATIVE  HIV ANTIBODY (ROUTINE TESTING W REFLEX)  PHOSPHORUS  PROCALCITONIN  LACTIC ACID, PLASMA  TSH  LIPID PANEL    EKG None  Radiology DG Chest Port 1 View Result Date: 12/30/2023 CLINICAL DATA:  Shortness of breath, cough. EXAM: PORTABLE CHEST 1 VIEW COMPARISON:  December 02, 2023. FINDINGS: The heart size and mediastinal contours are within normal limits. Both lungs are clear. The visualized skeletal structures are unremarkable. IMPRESSION: No active disease. Electronically Signed   By: Lupita Raider M.D.   On: 12/30/2023 10:46    Procedures Procedures    Medications Ordered in  ED Medications  heparin injection 5,000 Units (5,000 Units Subcutaneous Given 12/31/23 0620)  sodium chloride flush (NS) 0.9 % injection 3 mL (3 mLs Intravenous Not Given 12/30/23 1238)  acetaminophen (TYLENOL) tablet 650 mg (has no administration in time range)    Or  acetaminophen (TYLENOL) suppository 650 mg (has no administration in time range)  oxyCODONE (Oxy IR/ROXICODONE) immediate release tablet 5 mg (5 mg Oral Given 12/31/23 0846)  HYDROmorphone (  DILAUDID) injection 0.5-1 mg (has no administration in time range)  traZODone (DESYREL) tablet 25 mg (has no administration in time range)  senna-docusate (Senokot-S) tablet 1 tablet (has no administration in time range)  bisacodyl (DULCOLAX) EC tablet 5 mg (has no administration in time range)  sodium phosphate (FLEET) enema 1 enema (has no administration in time range)  ondansetron (ZOFRAN) tablet 4 mg (has no administration in time range)    Or  ondansetron (ZOFRAN) injection 4 mg (has no administration in time range)  hydrALAZINE (APRESOLINE) injection 10 mg (has no administration in time range)  sodium chloride flush (NS) 0.9 % injection 3-10 mL ( Intravenous Not Given 12/30/23 1238)  budesonide (PULMICORT) nebulizer solution 0.5 mg (0.5 mg Nebulization Given 12/31/23 0933)  guaiFENesin-dextromethorphan (ROBITUSSIN DM) 100-10 MG/5ML syrup 10 mL (10 mLs Oral Not Given 12/31/23 0544)  benzonatate (TESSALON) capsule 200 mg (200 mg Oral Given 12/30/23 2125)  chlorpheniramine-HYDROcodone (TUSSIONEX) 10-8 MG/5ML suspension 5 mL (5 mLs Oral Patient Refused/Not Given 12/30/23 2129)  levothyroxine (SYNTHROID) tablet 50 mcg (50 mcg Oral Given 12/31/23 0620)  memantine (NAMENDA) tablet 10 mg (10 mg Oral Given 12/30/23 2125)  donepezil (ARICEPT) tablet 10 mg (10 mg Oral Given 12/30/23 2125)  metoprolol tartrate (LOPRESSOR) tablet 50 mg (has no administration in time range)  amLODipine (NORVASC) tablet 10 mg (10 mg Oral Given 12/30/23 1431)  aspirin EC tablet 81 mg (has  no administration in time range)  levofloxacin (LEVAQUIN) tablet 500 mg (has no administration in time range)  ipratropium (ATROVENT) nebulizer solution 0.5 mg (0.5 mg Nebulization Given 12/31/23 0929)  levalbuterol (XOPENEX) nebulizer solution 0.63 mg (0.63 mg Nebulization Given 12/31/23 0929)  methylPREDNISolone sodium succinate (SOLU-MEDROL) 40 mg/mL injection 40 mg (has no administration in time range)  magnesium sulfate IVPB 2 g 50 mL (0 g Intravenous Stopped 12/30/23 1117)  levofloxacin (LEVAQUIN) IVPB 500 mg (500 mg Intravenous New Bag/Given 12/30/23 1230)    ED Course/ Medical Decision Making/ A&P                                 Medical Decision Making Amount and/or Complexity of Data Reviewed Labs: ordered. Radiology: ordered.  Risk Prescription drug management. Decision regarding hospitalization.  Patient with cough and shortness of breath and COPD exacerbation.  She will be admitted to medicine        Final Clinical Impression(s) / ED Diagnoses Final diagnoses:  COPD exacerbation [facility])    Rx / DC Orders ED Discharge Orders     None         Bethann Berkshire, MD 12/31/23 0945

## 2023-12-31 NOTE — TOC Progression Note (Signed)
 Transition of Care [facility]) - Progression Note    Patient Details  Name: MYLEIGH AMARA MRN: [id number] Date of Birth: 31-Jul-1961  Transition of Care [facility]) CM/SW Contact  Leitha Bleak, RN Phone Number: 12/31/2023, 2:44 PM  Clinical Narrative:   referral called into [facility]. They stated patient was active with them, it has been one year since her followup. They have not been able to reach her. Her address and phone number is different than what they have on file.     Social Determinants of Health (SDOH) Interventions SDOH Screenings   Food Insecurity: Patient Declined (12/30/2023)  Housing: Patient Declined (12/30/2023)  Transportation Needs: Patient Declined (12/30/2023)  Utilities: Patient Declined (12/30/2023)  Alcohol Screen: Low Risk  (10/24/2020)  Depression (PHQ2-9): Low Risk  (10/24/2020)  Tobacco Use: Medium Risk (12/30/2023)    Readmission Risk Interventions    12/30/2023    6:11 PM  Readmission Risk Prevention Plan  Post Dischage Appt Complete  Medication Screening Complete  Transportation Screening Complete

## 2023-12-31 NOTE — Progress Notes (Signed)
 Mobility Specialist Progress Note:    12/31/23 1114  Mobility  Activity Ambulated with assistance in hallway  Level of Assistance Contact guard assist, steadying assist  Assistive Device None  Distance Ambulated (ft) 50 ft  Range of Motion/Exercises Active;All extremities  Activity Response Tolerated well  Mobility Referral Yes  Mobility visit 1 Mobility  Mobility Specialist Start Time (ACUTE ONLY) 1040  Mobility Specialist Stop Time (ACUTE ONLY) 1100  Mobility Specialist Time Calculation (min) (ACUTE ONLY) 20 min   Pt received in bed, sister and cousin in room. Agreeable to mobility, required CGA to stand and ambulate with no AD. Tolerated well, SpO2 90% on 2L during ambulation. Pt c/o back pain. Returned supine, all needs met.  Lawerance Bach Mobility Specialist Please contact via Special educational needs teacher or  Rehab office at [phone]

## 2024-01-01 LAB — BASIC METABOLIC PANEL WITH GFR
Anion gap: 7 (ref 5–15)
BUN: 26 mg/dL — ABNORMAL HIGH (ref 8–23)
CO2: 26 mmol/L (ref 22–32)
Calcium: 8.9 mg/dL (ref 8.9–10.3)
Chloride: 100 mmol/L (ref 98–111)
Creatinine, Ser: 0.93 mg/dL (ref 0.44–1.00)
GFR, Estimated: >60 mL/min (ref >60)
Glucose, Bld: 128 mg/dL — ABNORMAL HIGH (ref 70–99)
Potassium: 4.6 mmol/L (ref 3.5–5.1)
Sodium: 133 mmol/L — ABNORMAL LOW (ref 135–145)

## 2024-01-01 LAB — CBC
HCT: 36.8 % (ref 36.0–46.0)
Hemoglobin: 10.9 g/dL — ABNORMAL LOW (ref 12.0–15.0)
MCH: 22.7 pg — ABNORMAL LOW (ref 26.0–34.0)
MCHC: 29.6 g/dL — ABNORMAL LOW (ref 30.0–36.0)
MCV: 76.5 fL — ABNORMAL LOW (ref 80.0–100.0)
Platelets: 421 10*3/uL — ABNORMAL HIGH (ref 150–400)
RBC: 4.81 MIL/uL (ref 3.87–5.11)
RDW: 16.4 % — ABNORMAL HIGH (ref 11.5–15.5)
WBC: 27.9 10*3/uL — ABNORMAL HIGH (ref 4.0–10.5)
nRBC: 0 % (ref 0.0–0.2)

## 2024-01-01 LAB — IRON AND TIBC
Iron: 19 ug/dL — ABNORMAL LOW (ref 28–170)
Saturation Ratios: 5 % — ABNORMAL LOW (ref 10.4–31.8)
TIBC: 378 ug/dL (ref 250–450)
UIBC: 359 ug/dL

## 2024-01-01 LAB — PROCALCITONIN: Procalcitonin: <0.1 ng/mL

## 2024-01-01 LAB — BRAIN NATRIURETIC PEPTIDE: B Natriuretic Peptide: 228 pg/mL — ABNORMAL HIGH (ref 0.0–100.0)

## 2024-01-01 LAB — GLUCOSE, CAPILLARY: Glucose-Capillary: 121 mg/dL — ABNORMAL HIGH (ref 70–99)

## 2024-01-01 MED ORDER — LEVOFLOXACIN 500 MG PO TABS
750.0000 mg | ORAL_TABLET | Freq: Every day | ORAL | Status: DC
  Administered 2024-01-01 – 2024-01-05 (×5): 750 mg via ORAL
  Filled 2024-01-01 (×6): qty 2

## 2024-01-01 MED ORDER — PREDNISONE 20 MG PO TABS
50.0000 mg | ORAL_TABLET | Freq: Every day | ORAL | Status: DC
  Administered 2024-01-01 – 2024-01-05 (×5): 50 mg via ORAL
  Filled 2024-01-01 (×5): qty 1

## 2024-01-01 MED ORDER — TRAMADOL HCL 50 MG PO TABS
25.0000 mg | ORAL_TABLET | Freq: Four times a day (QID) | ORAL | Status: DC | PRN
  Administered 2024-01-01 – 2024-01-02 (×2): 25 mg via ORAL
  Filled 2024-01-01 (×2): qty 1

## 2024-01-01 MED ORDER — LIDOCAINE 5 % EX PTCH
1.0000 | MEDICATED_PATCH | CUTANEOUS | Status: DC
  Administered 2024-01-01 – 2024-01-06 (×6): 1 via TRANSDERMAL
  Filled 2024-01-01 (×6): qty 1

## 2024-01-01 MED ORDER — MELATONIN 3 MG PO TABS
3.0000 mg | ORAL_TABLET | Freq: Every day | ORAL | Status: DC
  Administered 2024-01-01 – 2024-01-06 (×6): 3 mg via ORAL
  Filled 2024-01-01 (×6): qty 1

## 2024-01-01 NOTE — Progress Notes (Signed)
 PROGRESS NOTE    Patient: Stephanie Howe                            PCP: Pcp, No                    DOB: 1960/11/09            DOA: 12/30/2023 [id number]             DOS: 01/01/2024, 11:13 AM   LOS: 2 days   Date of Service: The patient was seen and examined on 01/01/2024  Subjective:   The patient was seen and examined this morning.  Hemodynamically stable, Still requiring 2 L of oxygen, satting 93% Complaining of significant back/flank pains right greater than left with deep breathing and coughing stating it is unbearable...     Brief Narrative:   Stephanie Howe is a 63-year-old female with significant history of COPD with history of tobacco abuse (7 years ago), hypertension, hyperlipidemia, hypothyroidism, GERD, memory deficit, poor historian.... Presented to the ED with chief complaint of shortness of breath, apparently patient was satting in low 90s on room air, improved with 2 L of oxygen to 97%.    ED evaluation: Blood pressure (!) 158/72, pulse (!) 59, temperature 98.8 F (37.1 C), resp. rate (!) 22, height 5\' 1"  (1.549 m), weight 59.7 kg, SpO2 97%.  On 2 L  Labs: CBC WBC 12.4, MCV 76.4, MCH 22.6, RDW 17.4, platelets 411, neutrophil 10.5, eosinophil 0.3, BMP within normal limits, troponin 12, LFTs within normal limits, D-dimer negative at 0.44,  Chest x-ray per radiology-clear, Patient was treated with nebs, IV steroids, IV antibiotics Levaquin Requested to be admitted for COPD exacerbation       Assessment & Plan:   Principal Problem:   Acute respiratory failure (HCC) Active Problems:   COPD (chronic obstructive pulmonary disease) (HCC)   Essential hypertension   Hypothyroidism   Hyperlipidemia   Gastroesophageal reflux disease with esophagitis   Vitamin D deficiency disease   Memory impairment     Assessment and Plan:  * Acute hypoxic respiratory failure (HCC) -due to COPD exacerbation-Bronchitis -On 2 L of oxygen, satting 93% today - on room air  86% (Not O2 dependent at baseline) -Persistent nonproductive cough, bilateral costophrenic pain with deep breathing and cough -Therefore shallow breathing -  -Influenza A, RSV, SARS-CoV-2 all negative --Procalcitonin <0.10, lactic acid 1.0, WBC 19.9, 27.9 -Personally reviewed chest x-ray clear, D-dimer negative at 0.44   -Shortness of breath with exertion  -Added Lidoderm patch, Ultram as needed-intolerant to high-dose narcotics  (History of remote tobacco abuse, quit 7 years ago)  -Will continue IV steroids, IV antibiotics of Levaquin -Will continue scheduled and as needed DuoNeb bronchodilators    Leukocytosis -WBC 19.3, 27.9 -due to highest steroid use, tapering down, continuing empiric p.o. antibiotics of Levaquin  COPD (chronic obstructive pulmonary disease) (HCC)- exacerbation, treatment as above  Hypothyroidism - Continue Synthroid  Essential hypertension -Stable- continue Norvasc and Lopressor  Gastroesophageal reflux disease with esophagitis - Continue PPI  Hyperlipidemia - continue statins  Memory impairment - Dementia (more of a short-term memory deficit) -Will continue  Aricept and Namenda  Vitamin D deficiency disease Continue vitamin D supplements -Vitamin D levels  Chronic iron deficiency anemia -Iron supplement  ----------------------------------------------------------------------------------------------------------------------------------------------- Nutritional status:  The patient's BMI is: Body mass index is 24.7 kg/m. I agree with the assessment and plan as outlined.   ------------------------------------------------------------------------------------------------------------------------------------------------  DVT prophylaxis:  heparin injection 5,000 Units Start: 12/30/23 2200 TED hose Start: 12/30/23 1216 SCDs Start: 12/30/23 1216   Code Status:   Code Status: Full Code  Family Communication: Family members present at  bedside-updated -Advance care planning has been discussed.   Admission status:   Status is: Inpatient Remains inpatient appropriate because: Needing respiratory support, scheduled breathing treatment, IV steroids   Disposition: From  - home             Planning for discharge in 1 day  to home w [facility]   Procedures:   No admission procedures for hospital encounter.   Antimicrobials:  Anti-infectives (From admission, onward)    Start     Dose/Rate Route Frequency Ordered Stop   01/01/24 1000  levofloxacin (LEVAQUIN) tablet 750 mg        750 mg Oral Daily 01/01/24 0808     12/31/23 1000  levofloxacin (LEVAQUIN) tablet 500 mg  Status:  Discontinued        500 mg Oral Daily 12/30/23 1339 01/01/24 0808   12/30/23 1345  levofloxacin (LEVAQUIN) tablet 500 mg  Status:  Discontinued        500 mg Oral Daily 12/30/23 1337 12/30/23 1339   12/30/23 1145  levofloxacin (LEVAQUIN) IVPB 500 mg        500 mg 100 mL/hr over 60 Minutes Intravenous  Once 12/30/23 1143 12/30/23 1330        Medication:   amLODipine  10 mg Oral Daily   aspirin EC  81 mg Oral Daily   benzonatate  200 mg Oral TID   budesonide (PULMICORT) nebulizer solution  0.5 mg Nebulization BID   chlorpheniramine-HYDROcodone  5 mL Oral Q12H   donepezil  10 mg Oral QHS   guaiFENesin-dextromethorphan  10 mL Oral Q8H   heparin  5,000 Units Subcutaneous Q8H   ipratropium  0.5 mg Nebulization TID   levalbuterol  0.63 mg Nebulization TID   levofloxacin  750 mg Oral Daily   levothyroxine  50 mcg Oral Q0600   lidocaine  1 patch Transdermal Q24H   melatonin  3 mg Oral QHS   memantine  10 mg Oral BID   metoprolol tartrate  50 mg Oral BID   predniSONE  50 mg Oral Q breakfast   sodium chloride flush  3 mL Intravenous Q12H   sodium chloride flush  3-10 mL Intravenous Q12H    acetaminophen **OR** acetaminophen, bisacodyl, hydrALAZINE, HYDROmorphone (DILAUDID) injection, ondansetron **OR** ondansetron (ZOFRAN) IV, oxyCODONE,  senna-docusate, sodium phosphate, traMADol, traZODone   Objective:   Vitals:   12/31/23 2047 01/01/24 0458 01/01/24 0500 01/01/24 1042  BP:  127/65  119/70  Pulse: 66 (!) 56  (!) 51  Resp:  18  18  Temp:  98.7 F (37.1 C)    TempSrc:  Oral    SpO2:  93%  93%  Weight:   59.3 kg   Height:        Intake/Output Summary (Last 24 hours) at 01/01/2024 1113 Last data filed at 01/01/2024 0830 Gross per 24 hour  Intake 720 ml  Output --  Net 720 ml   Filed Weights   12/30/23 1300 12/31/23 0552 01/01/24 0500  Weight: 57.3 kg 58.4 kg 59.3 kg     Physical examination:        General:  AAO x 3,  cooperative, no distress;   HEENT:  Normocephalic, PERRL, otherwise with in Normal limits   Neuro:  CNII-XII intact. , normal motor and sensation,  reflexes intact   Lungs:   Positive breath sounds in all lung fields, diffuse wheezing and rhonchi improved, minimal crackles, positive costophrenic tenderness especially with deep breathing  Cardio:    S1/S2, RRR, No murmure, No Rubs or Gallops   Abdomen:  Soft, non-tender, bowel sounds active all four quadrants, no guarding or peritoneal signs.  Muscular  skeletal:  Limited exam -global generalized weaknesses - in bed, able to move all 4 extremities,   2+ pulses,  symmetric, No pitting edema  Skin:  Dry, warm to touch, negative for any Rashes,  Wounds: Please see nursing documentation         ------------------------------------------------------------------------------------------------------------------------------------------    LABs:     Latest Ref Rng & Units 01/01/2024    4:52 AM 12/31/2023    5:29 AM 12/30/2023    8:55 AM  CBC  WBC 4.0 - 10.5 K/uL 27.9  19.3  12.4   Hemoglobin 12.0 - 15.0 g/dL 29.5  28.4  13.2   Hematocrit 36.0 - 46.0 % 36.8  38.9  43.3   Platelets 150 - 400 K/uL 421  434  411       Latest Ref Rng & Units 01/01/2024    4:52 AM 12/31/2023    5:29 AM 12/30/2023    8:55 AM  CMP  Glucose 70 - 99 mg/dL 440  102   725   BUN 8 - 23 mg/dL 26  17  12    Creatinine 0.44 - 1.00 mg/dL 3.66  4.40  3.47   Sodium 135 - 145 mmol/L 133  133  140   Potassium 3.5 - 5.1 mmol/L 4.6  4.4  3.9   Chloride 98 - 111 mmol/L 100  98  102   CO2 22 - 32 mmol/L 26  24  27    Calcium 8.9 - 10.3 mg/dL 8.9  9.1  9.3   Total Protein 6.5 - 8.1 g/dL   7.4   Total Bilirubin 0.0 - 1.2 mg/dL   0.5   Alkaline Phos 38 - 126 U/L   75   AST 15 - 41 U/L   16   ALT 0 - 44 U/L   9        Micro Results Recent Results (from the past 240 hours)  Resp panel by RT-PCR (RSV, Flu A&B, Covid) Anterior Nasal Swab     Status: None   Collection Time: 12/30/23 12:24 PM   Specimen: Anterior Nasal Swab  Result Value Ref Range Status   SARS Coronavirus 2 by RT PCR NEGATIVE NEGATIVE Final    Comment: (NOTE) SARS-CoV-2 target nucleic acids are NOT DETECTED.  The SARS-CoV-2 RNA is generally detectable in upper respiratory specimens during the acute phase of infection. The lowest concentration of SARS-CoV-2 viral copies this assay can detect is 138 copies/mL. A negative result does not preclude SARS-Cov-2 infection and should not be used as the sole basis for treatment or other patient management decisions. A negative result may occur with  improper specimen collection/handling, submission of specimen other than nasopharyngeal swab, presence of viral mutation(s) within the areas targeted by this assay, and inadequate number of viral copies(<138 copies/mL). A negative result must be combined with clinical observations, patient history, and epidemiological information. The expected result is Negative.  Fact Sheet for Patients:  BloggerCourse.com  Fact Sheet for Healthcare Providers:  SeriousBroker.it  This test is no t yet approved or cleared by the [place] FDA and  has been authorized for detection and/or diagnosis of SARS-CoV-2 by FDA  under an Emergency Use Authorization (EUA). This  EUA will remain  in effect (meaning this test can be used) for the duration of the COVID-19 declaration under Section 564(b)(1) of the Act, 21 U.S.C.section 360bbb-3(b)(1), unless the authorization is terminated  or revoked sooner.       Influenza A by PCR NEGATIVE NEGATIVE Final   Influenza B by PCR NEGATIVE NEGATIVE Final    Comment: (NOTE) The Xpert Xpress SARS-CoV-2/FLU/RSV plus assay is intended as an aid in the diagnosis of influenza from Nasopharyngeal swab specimens and should not be used as a sole basis for treatment. Nasal washings and aspirates are unacceptable for Xpert Xpress SARS-CoV-2/FLU/RSV testing.  Fact Sheet for Patients: BloggerCourse.com  Fact Sheet for Healthcare Providers: SeriousBroker.it  This test is not yet approved or cleared by the [place] FDA and has been authorized for detection and/or diagnosis of SARS-CoV-2 by FDA under an Emergency Use Authorization (EUA). This EUA will remain in effect (meaning this test can be used) for the duration of the COVID-19 declaration under Section 564(b)(1) of the Act, 21 U.S.C. section 360bbb-3(b)(1), unless the authorization is terminated or revoked.     Resp Syncytial Virus by PCR NEGATIVE NEGATIVE Final    Comment: (NOTE) Fact Sheet for Patients: BloggerCourse.com  Fact Sheet for Healthcare Providers: SeriousBroker.it  This test is not yet approved or cleared by the [place] FDA and has been authorized for detection and/or diagnosis of SARS-CoV-2 by FDA under an Emergency Use Authorization (EUA). This EUA will remain in effect (meaning this test can be used) for the duration of the COVID-19 declaration under Section 564(b)(1) of the Act, 21 U.S.C. section 360bbb-3(b)(1), unless the authorization is terminated or revoked.  Performed at [facility], [address] [place], [place] [address]    Culture, blood (Routine X 2) w Reflex to ID Panel     Status: None (Preliminary result)   Collection Time: 12/30/23 12:55 PM   Specimen: BLOOD  Result Value Ref Range Status   Specimen Description BLOOD BLOOD RIGHT ARM  Final   Special Requests   Final    BOTTLES DRAWN AEROBIC AND ANAEROBIC Blood Culture adequate volume   Culture   Final    NO GROWTH 2 DAYS Performed at [facility], [address] [place], [place] [address]    Report Status PENDING  Incomplete  Culture, blood (Routine X 2) w Reflex to ID Panel     Status: None (Preliminary result)   Collection Time: 12/30/23 12:55 PM   Specimen: BLOOD  Result Value Ref Range Status   Specimen Description BLOOD RW  Final   Special Requests   Final    BOTTLES DRAWN AEROBIC AND ANAEROBIC Blood Culture adequate volume   Culture   Final    NO GROWTH 2 DAYS Performed at [facility], [address], [place], [place] [address]    Report Status PENDING  Incomplete    Radiology Reports No results found.  SIGNED: Kendell Bane, MD, FHM. FAAFP. [facility] Gainer - [facility] Time spent - 55 min.  In seeing, evaluating and examining the patient. Reviewing medical records, labs, drawn plan of care. [facility],  Pager (please use [email or web address] to page/ text) Please use Epic Secure Chat for non-urgent communication (7AM-7PM)  If 7PM-7AM, please contact night-coverage [email or web address], 01/01/2024, 11:13 AM

## 2024-01-01 NOTE — Discharge Instructions (Signed)

## 2024-01-01 NOTE — Plan of Care (Signed)

## 2024-01-01 NOTE — Progress Notes (Signed)
 Mobility Specialist Progress Note:    01/01/24 1219  Mobility  Activity Ambulated with assistance in hallway  Level of Assistance Standby assist, set-up cues, supervision of patient - no hands on  Distance Ambulated (ft) 100 ft  Range of Motion/Exercises Active;All extremities  Activity Response Tolerated well  Mobility Referral Yes  Mobility visit 1 Mobility  Mobility Specialist Start Time (ACUTE ONLY) 1130  Mobility Specialist Stop Time (ACUTE ONLY) 1150  Mobility Specialist Time Calculation (min) (ACUTE ONLY) 20 min   Pt received in bed, agreeable to mobility. Required SBA to stand and ambulate with no AD. Tolerated well, SpO2 88-90% on RA throughout session. Denies SOB. Returned to room, SpO2 93% on 1L after ambulation. HR 44 bpm, notified RN. All needs met.   Lawerance Bach Mobility Specialist Please contact via Special educational needs teacher or  Rehab office at [phone]

## 2024-01-02 LAB — GLUCOSE, CAPILLARY: Glucose-Capillary: 104 mg/dL — ABNORMAL HIGH (ref 70–99)

## 2024-01-02 LAB — CBC
HCT: 39.5 % (ref 36.0–46.0)
Hemoglobin: 13.3 g/dL (ref 12.0–15.0)
MCH: 30.8 pg (ref 26.0–34.0)
MCHC: 33.7 g/dL (ref 30.0–36.0)
MCV: 91.4 fL (ref 80.0–100.0)
Platelets: 123 10*3/uL — ABNORMAL LOW (ref 150–400)
RBC: 4.32 MIL/uL (ref 3.87–5.11)
RDW: 12.6 % (ref 11.5–15.5)
WBC: 5.6 10*3/uL (ref 4.0–10.5)
nRBC: 0 % (ref 0.0–0.2)

## 2024-01-02 LAB — BASIC METABOLIC PANEL WITH GFR
Anion gap: 6 (ref 5–15)
BUN: <5 mg/dL — ABNORMAL LOW (ref 8–23)
CO2: 22 mmol/L (ref 22–32)
Calcium: 8.6 mg/dL — ABNORMAL LOW (ref 8.9–10.3)
Chloride: 111 mmol/L (ref 98–111)
Creatinine, Ser: 0.68 mg/dL (ref 0.44–1.00)
GFR, Estimated: >60 mL/min (ref >60)
Glucose, Bld: 85 mg/dL (ref 70–99)
Potassium: 3.7 mmol/L (ref 3.5–5.1)
Sodium: 139 mmol/L (ref 135–145)

## 2024-01-02 MED ORDER — ARFORMOTEROL TARTRATE 15 MCG/2ML IN NEBU
15.0000 ug | INHALATION_SOLUTION | Freq: Two times a day (BID) | RESPIRATORY_TRACT | Status: DC
  Administered 2024-01-02 – 2024-01-06 (×10): 15 ug via RESPIRATORY_TRACT
  Filled 2024-01-02 (×10): qty 2

## 2024-01-02 NOTE — Plan of Care (Signed)
   Problem: Education: Goal: Knowledge of General Education information will improve Description Including pain rating scale, medication(s)/side effects and non-pharmacologic comfort measures Outcome: Progressing   Problem: Health Behavior/Discharge Planning: Goal: Ability to manage health-related needs will improve Outcome: Progressing

## 2024-01-02 NOTE — Progress Notes (Signed)
  Progress Note   Patient: Stephanie Howe:[id number] DOB: 1960-12-19 DOA: 12/30/2023     3 DOS: the patient was seen and examined on 01/02/2024   Brief hospital course: Stephanie Howe is a 62-year-old female with significant history of COPD with history of tobacco abuse (7 years ago), hypertension, hyperlipidemia, hypothyroidism, GERD, memory deficit, poor historian.... Presented to the ED with chief complaint of shortness of breath, apparently patient was satting in low 90s on room air, improved with 2 L of oxygen to 97%.  Assessment and Plan: Acute hypoxic resp failure due to COPD exacerbation/bronchitis  - Levaquin 750 mg PO daily  - Pulmicort/brovana bid  - Prednisone 50 mg PO daily  - Atrovent neb tid - Xopenex neb tid  - Tessalon capsule 200 mg PO tid  - Tussionex 5 mL PO q12  - Robitussin DM 10 mL PO q8hr   Hypothyroidism  - Synthroid 50 mcg PO daily   HTN - Norvasc 10 mg PO daily  - Lopressor 50 mg PO bid   Memory impairment  - Aricept 10 mg PO at bedtime  - Namenda 10 mg PO bid   Subjective: Pt seen and examined at the bedside. She is now on pulmicort/brovana. Incentive spirometer ordered. She is still not ready for discharge at this time and is intermittently requiring 2L [place] to maintain her O2 sats. Continue inpatient care.  Physical Exam: Vitals:   01/02/24 0357 01/02/24 0500 01/02/24 0726 01/02/24 0921  BP: 129/61   106/61  Pulse:      Resp: 18   16  Temp: 98.9 F (37.2 C)     TempSrc: Oral     SpO2: 94%  93% 96%  Weight:  59.8 kg    Height:       Physical Exam HENT:     Head: Normocephalic.  Cardiovascular:     Rate and Rhythm: Normal rate and regular rhythm.  Pulmonary:     Breath sounds: Wheezing present.  Abdominal:     Palpations: Abdomen is soft.  Musculoskeletal:        General: Normal range of motion.     Cervical back: Neck supple.  Skin:    General: Skin is warm.  Neurological:     Mental Status: She is alert and oriented to person,  place, and time.  Psychiatric:        Mood and Affect: Mood normal.       Disposition: Status is: Inpatient Remains inpatient appropriate because: breathing tx and oxygen weaning   Planned Discharge Destination: Home    Time spent: 35 minutes  Author: Baron Howe , MD 01/02/2024 1:03 PM  For on call review [email or web address].

## 2024-01-02 NOTE — Plan of Care (Signed)

## 2024-01-02 NOTE — Progress Notes (Signed)
 Mobility Specialist Progress Note:    01/02/24 1000  Mobility  Activity Ambulated with assistance in hallway  Level of Assistance Standby assist, set-up cues, supervision of patient - no hands on  Assistive Device None  Distance Ambulated (ft) 110 ft  Range of Motion/Exercises Active;All extremities  Activity Response Tolerated well  Mobility Referral Yes  Mobility visit 1 Mobility  Mobility Specialist Start Time (ACUTE ONLY) 1000  Mobility Specialist Stop Time (ACUTE ONLY) 1020  Mobility Specialist Time Calculation (min) (ACUTE ONLY) 20 min   Pt received in bed, agreeable to mobility. Required SBA to stand and ambulate with no AD. Tolerated well, SpO2 90-93% on 2L during ambulation. Returned pt supine, all needs met.  Stephanie Howe Mobility Specialist Please contact via Special educational needs teacher or  Rehab office at [phone]

## 2024-01-03 LAB — BASIC METABOLIC PANEL WITH GFR
Anion gap: 9 (ref 5–15)
BUN: 19 mg/dL (ref 8–23)
CO2: 29 mmol/L (ref 22–32)
Calcium: 8.6 mg/dL — ABNORMAL LOW (ref 8.9–10.3)
Chloride: 98 mmol/L (ref 98–111)
Creatinine, Ser: 0.84 mg/dL (ref 0.44–1.00)
GFR, Estimated: >60 mL/min (ref >60)
Glucose, Bld: 82 mg/dL (ref 70–99)
Potassium: 4.4 mmol/L (ref 3.5–5.1)
Sodium: 136 mmol/L (ref 135–145)

## 2024-01-03 LAB — GLUCOSE, CAPILLARY
Glucose-Capillary: 90 mg/dL (ref 70–99)
Glucose-Capillary: 194 mg/dL — ABNORMAL HIGH (ref 70–99)

## 2024-01-03 LAB — C-REACTIVE PROTEIN: CRP: 1 mg/dL — ABNORMAL HIGH (ref <1.0)

## 2024-01-03 LAB — CBC
HCT: 35 % — ABNORMAL LOW (ref 36.0–46.0)
Hemoglobin: 10.6 g/dL — ABNORMAL LOW (ref 12.0–15.0)
MCH: 23.1 pg — ABNORMAL LOW (ref 26.0–34.0)
MCHC: 30.3 g/dL (ref 30.0–36.0)
MCV: 76.3 fL — ABNORMAL LOW (ref 80.0–100.0)
Platelets: 407 10*3/uL — ABNORMAL HIGH (ref 150–400)
RBC: 4.59 MIL/uL (ref 3.87–5.11)
RDW: 16.8 % — ABNORMAL HIGH (ref 11.5–15.5)
WBC: 16.7 10*3/uL — ABNORMAL HIGH (ref 4.0–10.5)
nRBC: 0.1 % (ref 0.0–0.2)

## 2024-01-03 LAB — MAGNESIUM: Magnesium: 2.6 mg/dL — ABNORMAL HIGH (ref 1.7–2.4)

## 2024-01-03 NOTE — Progress Notes (Signed)
 Mobility Specialist Progress Note:    01/03/24 0940  Mobility  Activity Ambulated with assistance in hallway  Level of Assistance Standby assist, set-up cues, supervision of patient - no hands on  Assistive Device None  Distance Ambulated (ft) 100 ft  Range of Motion/Exercises Active;All extremities  Activity Response Tolerated well  Mobility Referral Yes  Mobility visit 1 Mobility  Mobility Specialist Start Time (ACUTE ONLY) 0940  Mobility Specialist Stop Time (ACUTE ONLY) 1005  Mobility Specialist Time Calculation (min) (ACUTE ONLY) 25 min   Pt received in bed, agreeable to mobility. Required SBA to stand and ambulate with no AD. Tolerated well, SpO2 93% on 1L at rest. SpO2 89-92% on 1L during ambulation. Returned pt supine, all needs met.  Treyden Hakim Mobility Specialist Please contact via Special educational needs teacher or  Rehab office at [phone]

## 2024-01-03 NOTE — Progress Notes (Addendum)
 Patient states that she is a little cool and clammy. Temp was 98 and blood sugar was 194. MD Sira aware.  MD Sira came to bedside to check on patient. Patient states it has subsided.

## 2024-01-03 NOTE — Plan of Care (Signed)
   Problem: Education: Goal: Knowledge of General Education information will improve Description Including pain rating scale, medication(s)/side effects and non-pharmacologic comfort measures Outcome: Progressing   Problem: Health Behavior/Discharge Planning: Goal: Ability to manage health-related needs will improve Outcome: Progressing

## 2024-01-03 NOTE — Progress Notes (Signed)
  Progress Note   Patient: Stephanie Howe DOB: [birth year] DOA: 12/30/2023     4 DOS: the patient was seen and examined on 01/03/2024   Brief hospital course: Stephanie Howe is a 63-year-old female with significant history of COPD with history of tobacco abuse (7 years ago), hypertension, hyperlipidemia, hypothyroidism, GERD, memory deficit, poor historian.... Presented to the ED with chief complaint of shortness of breath, apparently patient was satting in low 90s on room air, improved with 2 L of oxygen to 97%.  Assessment and Plan: Acute hypoxic resp failure due to COPD exacerbation/bronchitis  - Levaquin 750 mg PO daily  - Pulmicort/brovana bid  - Prednisone 50 mg PO daily  - Atrovent neb tid - Xopenex neb tid  - Tessalon capsule 200 mg PO tid  - Tussionex 5 mL PO q12  - Robitussin DM 10 mL PO q8hr    Hypothyroidism  - Synthroid 50 mcg PO daily    HTN - Norvasc 10 mg PO daily  - Lopressor 50 mg PO bid    Memory impairment  - Aricept 10 mg PO at bedtime  - Namenda 10 mg PO bid   Subjective: Pt seen and examined at the bedside. Resp exam is improving. She is still requiring oxygen. Continue with antibx and breathing tx's.  Physical Exam: Vitals:   01/03/24 0435 01/03/24 0500 01/03/24 0817 01/03/24 0829  BP: 117/81  137/61   Pulse: (!) 48  (!) 56   Resp: 20     Temp: 98.6 F (37 C)     TempSrc: Oral     SpO2: 93%   94%  Weight:  57.9 kg    Height:       HENT:     Head: Normocephalic.  Cardiovascular:     Rate and Rhythm: Normal rate and regular rhythm.  Pulmonary:     Breath sounds: Expiratory Wheezing present (improving)  Abdominal:     Palpations: Abdomen is soft.  Musculoskeletal:        General: Normal range of motion.     Cervical back: Neck supple.  Skin:    General: Skin is warm.  Neurological:     Mental Status: She is alert and oriented to person, place, and time.  Psychiatric:        Mood and Affect: Mood normal.       Disposition: Status is: Inpatient Remains inpatient appropriate because: breathing tx, antibx, oxygen weaning   Planned Discharge Destination: Home    Time spent: 35 minutes  Author: Lakendrick Paradis , MD 01/03/2024 11:42 AM  For on call review [email or web address].

## 2024-01-04 LAB — GLUCOSE, CAPILLARY: Glucose-Capillary: 75 mg/dL (ref 70–99)

## 2024-01-04 LAB — CBC
HCT: 37.8 % (ref 36.0–46.0)
Hemoglobin: 11.4 g/dL — ABNORMAL LOW (ref 12.0–15.0)
MCH: 22.8 pg — ABNORMAL LOW (ref 26.0–34.0)
MCHC: 30.2 g/dL (ref 30.0–36.0)
MCV: 75.8 fL — ABNORMAL LOW (ref 80.0–100.0)
Platelets: 439 10*3/uL — ABNORMAL HIGH (ref 150–400)
RBC: 4.99 MIL/uL (ref 3.87–5.11)
RDW: 17.3 % — ABNORMAL HIGH (ref 11.5–15.5)
WBC: 19.8 10*3/uL — ABNORMAL HIGH (ref 4.0–10.5)
nRBC: 0.2 % (ref 0.0–0.2)

## 2024-01-04 LAB — BASIC METABOLIC PANEL WITH GFR
Anion gap: 8 (ref 5–15)
BUN: 16 mg/dL (ref 8–23)
CO2: 28 mmol/L (ref 22–32)
Calcium: 8.9 mg/dL (ref 8.9–10.3)
Chloride: 100 mmol/L (ref 98–111)
Creatinine, Ser: 0.76 mg/dL (ref 0.44–1.00)
GFR, Estimated: >60 mL/min (ref >60)
Glucose, Bld: 82 mg/dL (ref 70–99)
Potassium: 4.6 mmol/L (ref 3.5–5.1)
Sodium: 136 mmol/L (ref 135–145)

## 2024-01-04 LAB — CULTURE, BLOOD (ROUTINE X 2)
Culture: NO GROWTH
Culture: NO GROWTH
Special Requests: ADEQUATE
Special Requests: ADEQUATE

## 2024-01-04 LAB — MAGNESIUM: Magnesium: 2.6 mg/dL — ABNORMAL HIGH (ref 1.7–2.4)

## 2024-01-04 LAB — C-REACTIVE PROTEIN: CRP: 0.7 mg/dL (ref <1.0)

## 2024-01-04 MED ORDER — METOPROLOL TARTRATE 25 MG PO TABS
12.5000 mg | ORAL_TABLET | Freq: Two times a day (BID) | ORAL | Status: DC
  Administered 2024-01-04 – 2024-01-06 (×4): 12.5 mg via ORAL
  Filled 2024-01-04 (×5): qty 1

## 2024-01-04 NOTE — Plan of Care (Signed)
  Problem: Education: Goal: Knowledge of General Education information will improve Description: Including pain rating scale, medication(s)/side effects and non-pharmacologic comfort measures Outcome: Progressing   Problem: Health Behavior/Discharge Planning: Goal: Ability to manage health-related needs will improve Outcome: Progressing   Problem: Clinical Measurements: Goal: Ability to maintain clinical measurements within normal limits will improve Outcome: Progressing   Problem: Activity: Goal: Ability to tolerate increased activity will improve Outcome: Progressing

## 2024-01-04 NOTE — Progress Notes (Signed)
  Progress Note   Patient: Stephanie Howe BJY:[id number] DOB: [birth year] DOA: 12/30/2023     5 DOS: the patient was seen and examined on 01/04/2024   Brief hospital course: Stephanie Howe is a 63-year-old female with significant history of COPD with history of tobacco abuse (7 years ago), hypertension,hyperlipidemia, hypothyroidism, GERD, memory deficit, poor historian.Presented to the ED with chief complaint of shortness of breath, apparently patient was satting in low 90s on room air, improved with 2 L of oxygen to 97%.  Assessment and Plan: Acute hypoxic resp failure due to COPD exacerbation/bronchitis  - Levaquin 750 mg PO daily  - Pulmicort/brovana bid  - Prednisone 50 mg PO daily  - Atrovent neb tid - Xopenex neb tid  - Tessalon capsule 200 mg PO tid  - Tussionex 5 mL PO q12  - Robitussin DM 10 mL PO q8hr    Hypothyroidism  - Synthroid 50 mcg PO daily    HTN - Norvasc 10 mg PO daily  - Lopressor 12.5 mg PO bid    Memory impairment  - Aricept 10 mg PO at bedtime  - Namenda 10 mg PO bid   Subjective: Pt seen and examined at the bedside. Clinically, she appears to be improving. She is now down to 1L [place]. I suspect in the next 24-36 hrs this should be able to be weaned off and be ready for discharge.  Lopressor decreased from 50 mg bid to 12.5 mg bid (due to bradycardia).  Physical Exam: Vitals:   01/04/24 0517 01/04/24 0808 01/04/24 0812 01/04/24 0825  BP: (!) 108/97   132/70  Pulse: (!) 44   (!) 56  Resp: 18     Temp: 98.9 F (37.2 C)     TempSrc: Oral     SpO2: 94% 92% 98%   Weight:      Height:       HENT:     Head: Normocephalic.  Cardiovascular:     Rate and Rhythm: Bradycardia Pulmonary:     Breath sounds: Expiratory wheeze improved Abdominal:     Palpations: Abdomen is soft.  Musculoskeletal:        General: Normal range of motion.     Cervical back: Neck supple.  Skin:    General: Skin is warm.  Neurological:     Mental Status: She is alert and  oriented to person, place, and time.  Psychiatric:        Mood and Affect: Mood normal.      Disposition: Status is: Inpatient Remains inpatient appropriate because: breathing tx, antibx and oxygen weaning   Planned Discharge Destination: Home    Time spent: 35 minutes  Author: Yaretzy Olazabal , MD 01/04/2024 10:41 AM  For on call review [email or web address].

## 2024-01-04 NOTE — Plan of Care (Signed)
  Problem: Education: Goal: Knowledge of General Education information will improve Description: Including pain rating scale, medication(s)/side effects and non-pharmacologic comfort measures Outcome: Adequate for Discharge   Problem: Health Behavior/Discharge Planning: Goal: Ability to manage health-related needs will improve Outcome: Adequate for Discharge   Problem: Clinical Measurements: Goal: Ability to maintain clinical measurements within normal limits will improve Outcome: Progressing Goal: Will remain free from infection Outcome: Progressing Goal: Diagnostic test results will improve Outcome: Progressing Goal: Respiratory complications will improve Outcome: Progressing Goal: Cardiovascular complication will be avoided Outcome: Adequate for Discharge   Problem: Activity: Goal: Risk for activity intolerance will decrease Outcome: Progressing   Problem: Nutrition: Goal: Adequate nutrition will be maintained Outcome: Progressing   Problem: Coping: Goal: Level of anxiety will decrease Outcome: Adequate for Discharge   Problem: Elimination: Goal: Will not experience complications related to bowel motility Outcome: Progressing Goal: Will not experience complications related to urinary retention Outcome: Adequate for Discharge   Problem: Pain Managment: Goal: General experience of comfort will improve and/or be controlled Outcome: Adequate for Discharge   Problem: Safety: Goal: Ability to remain free from injury will improve Outcome: Adequate for Discharge   Problem: Skin Integrity: Goal: Risk for impaired skin integrity will decrease Outcome: Adequate for Discharge   Problem: Education: Goal: Knowledge of disease or condition will improve Outcome: Progressing Goal: Knowledge of the prescribed therapeutic regimen will improve Outcome: Progressing Goal: Individualized Educational Video(s) Outcome: Progressing   Problem: Activity: Goal: Ability to tolerate  increased activity will improve Outcome: Progressing Goal: Will verbalize the importance of balancing activity with adequate rest periods Outcome: Progressing   Problem: Respiratory: Goal: Ability to maintain a clear airway will improve Outcome: Progressing Goal: Levels of oxygenation will improve Outcome: Progressing Goal: Ability to maintain adequate ventilation will improve Outcome: Progressing

## 2024-01-05 ENCOUNTER — Other Ambulatory Visit: Payer: Self-pay

## 2024-01-05 DIAGNOSIS — Z122 Encounter for screening for malignant neoplasm of respiratory organs: Secondary | ICD-10-CM

## 2024-01-05 DIAGNOSIS — F17210 Nicotine dependence, cigarettes, uncomplicated: Secondary | ICD-10-CM

## 2024-01-05 DIAGNOSIS — Z87891 Personal history of nicotine dependence: Secondary | ICD-10-CM

## 2024-01-05 LAB — GLUCOSE, CAPILLARY: Glucose-Capillary: 88 mg/dL (ref 70–99)

## 2024-01-05 MED ORDER — BENZONATATE 200 MG PO CAPS: 200.0000 mg | ORAL_CAPSULE | Freq: Three times a day (TID) | ORAL | 0 refills | Status: DC

## 2024-01-05 MED ORDER — FUROSEMIDE 10 MG/ML IJ SOLN
40.0000 mg | Freq: Once | INTRAMUSCULAR | Status: AC
  Administered 2024-01-05: 40 mg via INTRAVENOUS
  Filled 2024-01-05: qty 4

## 2024-01-05 MED ORDER — LEVOTHYROXINE SODIUM 50 MCG PO TABS: ORAL_TABLET | ORAL | 0 refills | Status: DC

## 2024-01-05 MED ORDER — MEMANTINE HCL 10 MG PO TABS: 10.0000 mg | ORAL_TABLET | Freq: Two times a day (BID) | ORAL | 5 refills | Status: DC

## 2024-01-05 MED ORDER — METOPROLOL TARTRATE 25 MG PO TABS: 12.5000 mg | ORAL_TABLET | Freq: Two times a day (BID) | ORAL | 1 refills | Status: DC

## 2024-01-05 MED ORDER — ATROVENT HFA 17 MCG/ACT IN AERS: 2.0000 | INHALATION_SPRAY | Freq: Four times a day (QID) | RESPIRATORY_TRACT | 12 refills | Status: DC

## 2024-01-05 MED ORDER — ASPIRIN 81 MG PO TBEC: 81.0000 mg | DELAYED_RELEASE_TABLET | Freq: Every day | ORAL | 12 refills | Status: DC

## 2024-01-05 MED ORDER — AMLODIPINE BESYLATE 10 MG PO TABS: 10.0000 mg | ORAL_TABLET | Freq: Every day | ORAL | 1 refills | Status: DC

## 2024-01-05 MED ORDER — LEVOFLOXACIN 750 MG PO TABS: 750.0000 mg | ORAL_TABLET | Freq: Every day | ORAL | 0 refills | Status: DC

## 2024-01-05 MED ORDER — VITAMIN B-12 1000 MCG PO TABS: 1000.0000 ug | ORAL_TABLET | Freq: Every day | ORAL | 0 refills | Status: DC

## 2024-01-05 MED ORDER — ACETAMINOPHEN 325 MG PO TABS: 650.0000 mg | ORAL_TABLET | Freq: Four times a day (QID) | ORAL | 0 refills | Status: DC | PRN

## 2024-01-05 MED ORDER — LIDOCAINE 5 % EX PTCH: 1.0000 | MEDICATED_PATCH | CUTANEOUS | 0 refills | Status: DC

## 2024-01-05 MED ORDER — ALBUTEROL SULFATE HFA 108 (90 BASE) MCG/ACT IN AERS: 2.0000 | INHALATION_SPRAY | Freq: Four times a day (QID) | RESPIRATORY_TRACT | 2 refills | Status: DC | PRN

## 2024-01-05 MED ORDER — RISAQUAD PO CAPS
2.0000 | ORAL_CAPSULE | Freq: Three times a day (TID) | ORAL | Status: DC
  Administered 2024-01-05 – 2024-01-06 (×6): 2 via ORAL
  Filled 2024-01-05 (×6): qty 2

## 2024-01-05 MED ORDER — RISAQUAD PO CAPS: 2.0000 | ORAL_CAPSULE | Freq: Three times a day (TID) | ORAL | 0 refills | Status: AC

## 2024-01-05 MED ORDER — GUAIFENESIN-DM 100-10 MG/5ML PO SYRP: 10.0000 mL | ORAL_SOLUTION | Freq: Three times a day (TID) | ORAL | 0 refills | Status: DC

## 2024-01-05 MED ORDER — PREDNISONE 20 MG PO TABS
40.0000 mg | ORAL_TABLET | Freq: Every day | ORAL | Status: DC
  Administered 2024-01-06: 40 mg via ORAL
  Filled 2024-01-05: qty 2

## 2024-01-05 MED ORDER — DONEPEZIL HCL 10 MG PO TABS: 10.0000 mg | ORAL_TABLET | Freq: Every day | ORAL | 6 refills | Status: DC

## 2024-01-05 MED ORDER — LEVALBUTEROL TARTRATE 45 MCG/ACT IN AERO: 2.0000 | INHALATION_SPRAY | Freq: Four times a day (QID) | RESPIRATORY_TRACT | 2 refills | Status: DC

## 2024-01-05 MED ORDER — METHYLPREDNISOLONE 4 MG PO TBPK: ORAL_TABLET | ORAL | 0 refills | Status: DC

## 2024-01-05 NOTE — Discharge Summary (Addendum)
 Physician Discharge Summary   Patient: Stephanie Howe MRN: [id number] DOB: [birth year]  Admit date:     12/30/2023  Discharge date: 01/05/24  Discharge Physician: Bobbetta Burnet   PCP: Pcp, No   With holding discharge as patient still requiring oxygen she does not have the financial means or insurance to provide home O2      recommendations at discharge:    Follow-up with PCP in 1-2 weeks May need referral from PCP to pulmonologist To continue inhalers as directed Complete course of antibiotics, taper down Medrol Dosepak  Discharge Diagnoses: Principal Problem:   Acute respiratory failure (HCC) Active Problems:   COPD (chronic obstructive pulmonary disease) (HCC)   Essential hypertension   Hypothyroidism   Hyperlipidemia   Gastroesophageal reflux disease with esophagitis   Vitamin D deficiency disease   Memory impairment    Hospital Course: Stephanie Howe is a 63-year-old female with significant history of COPD with history of tobacco abuse (7 years ago), hypertension, hyperlipidemia, hypothyroidism, GERD, memory deficit, poor historian.... Presented to the ED with chief complaint of shortness of breath, apparently patient was satting in low 90s on room air, improved with 2 L of oxygen to 97%.    ED evaluation: Blood pressure (!) 158/72, pulse (!) 59, temperature 98.8 F (37.1 C), resp. rate (!) 22, height 5\' 1"  (1.549 m), weight 59.7 kg, SpO2 97%.  On 2 L  Labs: CBC WBC 12.4, MCV 76.4, MCH 22.6, RDW 17.4, platelets 411, neutrophil 10.5, eosinophil 0.3, BMP within normal limits, troponin 12, LFTs within normal limits, D-dimer negative at 0.44,  Chest x-ray per radiology-clear, Patient was treated with nebs, IV steroids, IV antibiotics Levaquin Requested to be admitted for COPD exacerbation       Assessment and Plan: * Acute respiratory failure (HCC)-due to COPD, and COPD exacerbation - Per record on room air patient satting 89%, ambulating dropped to 85% 91%  on 2 L of oxygen  Patient is not O2 dependent at baseline. (History of remote tobacco abuse, quit 7 years ago)  - During this admission patient received IV steroids, IV antibiotics of Levaquin -Continue scheduled and as needed DuoNeb bronchodilators -Pending IV steroids--continue tapering down with Medrol Dosepak -Antibiotics Levaquin 2 more doses to complete course of antibiotics - Improved lactic acid, procalcitonin level  COPD (chronic obstructive pulmonary disease) (HCC) -COPD exacerbation, treatment as above-much improved Will need to follow-up with pulmonologist as an outpatient  Hypothyroidism Continue Synthroid  Essential hypertension - Norvasc and beta-blocker has been adjusted, patient BP remained stable  Gastroesophageal reflux disease with esophagitis - Continue PPI  Hyperlipidemia - continue statins  Memory impairment History of dementia, continue home medication of Aricept and Namenda  Vitamin D deficiency disease Continue vitamin D supplements      Disposition: Home Diet recommendation:  Discharge Diet Orders (From admission, onward)     Start     Ordered   01/05/24 0000  Diet - low sodium heart healthy        01/05/24 1149           Cardiac diet DISCHARGE MEDICATION: Allergies as of 01/05/2024       Reactions   Codeine    Penicillins         Medication List     TAKE these medications    acetaminophen 325 MG tablet Commonly known as: TYLENOL Take 2 tablets (650 mg total) by mouth every 6 (six) hours as needed for mild pain (pain score 1-3) (or Fever >/= 101).  acidophilus Caps capsule Take 2 capsules by mouth 3 (three) times daily for 5 days.   albuterol 108 (90 Base) MCG/ACT inhaler Commonly known as: VENTOLIN HFA Inhale 2 puffs into the lungs every 6 (six) hours as needed for wheezing or shortness of breath.   amLODipine 10 MG tablet Commonly known as: NORVASC Take 1 tablet (10 mg total) by mouth daily. Start taking on:  January 06, 2024 What changed:  how much to take how to take this when to take this additional instructions   aspirin EC 81 MG tablet Take 1 tablet (81 mg total) by mouth daily. Swallow whole. Start taking on: January 06, 2024   Atrovent HFA 17 MCG/ACT inhaler Generic drug: ipratropium Inhale 2 puffs into the lungs every 6 (six) hours.   benzonatate 200 MG capsule Commonly known as: TESSALON Take 1 capsule (200 mg total) by mouth 3 (three) times daily.   cyanocobalamin 1000 MCG tablet Commonly known as: VITAMIN B12 Take 1 tablet (1,000 mcg total) by mouth daily.   donepezil 10 MG tablet Commonly known as: ARICEPT Take 1 tablet (10 mg total) by mouth daily.   guaiFENesin-dextromethorphan 100-10 MG/5ML syrup Commonly known as: ROBITUSSIN DM Take 10 mLs by mouth every 8 (eight) hours.   levalbuterol 45 MCG/ACT inhaler Commonly known as: XOPENEX HFA Inhale 2 puffs into the lungs 4 (four) times daily.   levofloxacin 750 MG tablet Commonly known as: LEVAQUIN Take 1 tablet (750 mg total) by mouth daily for 2 days. Start taking on: January 06, 2024   levothyroxine 50 MCG tablet Commonly known as: Synthroid TAKE 1 & 1/2 Tablets BY MOUTH ONCE EVERY DAY ON MONDAY, WEDNESDAY AND FRIDAY AND TAKE 1 Tablet BY MOUTH ONCE EVERY DAY ON SUNDAY, TUESDAY, THURSDAY, AND SATURDAY   lidocaine 5 % Commonly known as: LIDODERM Place 1 patch onto the skin daily. Remove & Discard patch within 12 hours or as directed by MD Start taking on: January 06, 2024   memantine 10 MG tablet Commonly known as: Namenda Take 1 tablet (10 mg total) by mouth 2 (two) times daily.   methylPREDNISolone 4 MG Tbpk tablet Commonly known as: MEDROL DOSEPAK Medrol Dosepak take as instructed   metoprolol tartrate 25 MG tablet Commonly known as: LOPRESSOR Take 0.5 tablets (12.5 mg total) by mouth 2 (two) times daily. What changed:  medication strength how much to take        Follow-up Information     [facility]. Schedule an appointment as soon as possible for a visit.   Why: [phone]               Discharge Exam: Filed Weights   01/03/24 0500 01/04/24 0500 01/05/24 0443  Weight: 57.9 kg 57.6 kg 57.3 kg        General:  AAO x 3,  cooperative, no distress; improved shortness of breath  HEENT:  Normocephalic, PERRL, otherwise with in Normal limits   Neuro:  CNII-XII intact. , normal motor and sensation, reflexes intact   Lungs:   Clear to auscultation BL, Respirations unlabored,  Present but improved diffuse wheezing and rhonchi, negative for any crackles  Cardio:    S1/S2, RRR, No murmure, No Rubs or Gallops   Abdomen:  Soft, non-tender, bowel sounds active all four quadrants, no guarding or peritoneal signs.  Muscular  skeletal:  Limited exam -global generalized weaknesses - in bed, able to move all 4 extremities,   2+ pulses,  symmetric, No pitting edema  Skin:  Dry,  warm to touch, negative for any Rashes,  Wounds: Please see nursing documentation          Condition at discharge: fair  The results of significant diagnostics from this hospitalization (including imaging, microbiology, ancillary and laboratory) are listed below for reference.   Imaging Studies: DG Chest Port 1 View Result Date: 12/30/2023 CLINICAL DATA:  Shortness of breath, cough. EXAM: PORTABLE CHEST 1 VIEW COMPARISON:  December 02, 2023. FINDINGS: The heart size and mediastinal contours are within normal limits. Both lungs are clear. The visualized skeletal structures are unremarkable. IMPRESSION: No active disease. Electronically Signed   By: Lupita Raider M.D.   On: 12/30/2023 10:46    Microbiology: Results for orders placed or performed during the hospital encounter of 12/30/23  Resp panel by RT-PCR (RSV, Flu A&B, Covid) Anterior Nasal Swab     Status: None   Collection Time: 12/30/23 12:24 PM   Specimen: Anterior Nasal Swab  Result Value Ref Range Status   SARS Coronavirus 2 by RT PCR  NEGATIVE NEGATIVE Final    Comment: (NOTE) SARS-CoV-2 target nucleic acids are NOT DETECTED.  The SARS-CoV-2 RNA is generally detectable in upper respiratory specimens during the acute phase of infection. The lowest concentration of SARS-CoV-2 viral copies this assay can detect is 138 copies/mL. A negative result does not preclude SARS-Cov-2 infection and should not be used as the sole basis for treatment or other patient management decisions. A negative result may occur with  improper specimen collection/handling, submission of specimen other than nasopharyngeal swab, presence of viral mutation(s) within the areas targeted by this assay, and inadequate number of viral copies(<138 copies/mL). A negative result must be combined with clinical observations, patient history, and epidemiological information. The expected result is Negative.  Fact Sheet for Patients:  BloggerCourse.com  Fact Sheet for Healthcare Providers:  SeriousBroker.it  This test is no t yet approved or cleared by the [place] FDA and  has been authorized for detection and/or diagnosis of SARS-CoV-2 by FDA under an Emergency Use Authorization (EUA). This EUA will remain  in effect (meaning this test can be used) for the duration of the COVID-19 declaration under Section 564(b)(1) of the Act, 21 U.S.C.section 360bbb-3(b)(1), unless the authorization is terminated  or revoked sooner.       Influenza A by PCR NEGATIVE NEGATIVE Final   Influenza B by PCR NEGATIVE NEGATIVE Final    Comment: (NOTE) The Xpert Xpress SARS-CoV-2/FLU/RSV plus assay is intended as an aid in the diagnosis of influenza from Nasopharyngeal swab specimens and should not be used as a sole basis for treatment. Nasal washings and aspirates are unacceptable for Xpert Xpress SARS-CoV-2/FLU/RSV testing.  Fact Sheet for Patients: BloggerCourse.com  Fact Sheet for  Healthcare Providers: SeriousBroker.it  This test is not yet approved or cleared by the [place] FDA and has been authorized for detection and/or diagnosis of SARS-CoV-2 by FDA under an Emergency Use Authorization (EUA). This EUA will remain in effect (meaning this test can be used) for the duration of the COVID-19 declaration under Section 564(b)(1) of the Act, 21 U.S.C. section 360bbb-3(b)(1), unless the authorization is terminated or revoked.     Resp Syncytial Virus by PCR NEGATIVE NEGATIVE Final    Comment: (NOTE) Fact Sheet for Patients: BloggerCourse.com  Fact Sheet for Healthcare Providers: SeriousBroker.it  This test is not yet approved or cleared by the [place] FDA and has been authorized for detection and/or diagnosis of SARS-CoV-2 by FDA under an Emergency Use Authorization (EUA).  This EUA will remain in effect (meaning this test can be used) for the duration of the COVID-19 declaration under Section 564(b)(1) of the Act, 21 U.S.C. section 360bbb-3(b)(1), unless the authorization is terminated or revoked.  Performed at [facility], [address], [place], [place] [address]   Culture, blood (Routine X 2) w Reflex to ID Panel     Status: None   Collection Time: 12/30/23 12:55 PM   Specimen: BLOOD  Result Value Ref Range Status   Specimen Description BLOOD BLOOD RIGHT ARM  Final   Special Requests   Final    BOTTLES DRAWN AEROBIC AND ANAEROBIC Blood Culture adequate volume   Culture   Final    NO GROWTH 5 DAYS Performed at [facility], [address] [place], [place] [address]    Report Status 01/04/2024 FINAL  Final  Culture, blood (Routine X 2) w Reflex to ID Panel     Status: None   Collection Time: 12/30/23 12:55 PM   Specimen: BLOOD  Result Value Ref Range Status   Specimen Description BLOOD RW  Final   Special Requests   Final    BOTTLES DRAWN AEROBIC AND ANAEROBIC  Blood Culture adequate volume   Culture   Final    NO GROWTH 5 DAYS Performed at [facility], [address] [place], [place] [address]    Report Status 01/04/2024 FINAL  Final    Labs: CBC: Recent Labs  Lab 12/30/23 0855 12/31/23 0529 01/01/24 0452 01/02/24 0458 01/03/24 0427 01/04/24 0509  WBC 12.4* 19.3* 27.9* 5.6 16.7* 19.8*  NEUTROABS 10.5*  --   --   --   --   --   HGB 12.8 11.6* 10.9* 13.3 10.6* 11.4*  HCT 43.3 38.9 36.8 39.5 35.0* 37.8  MCV 76.4* 76.3* 76.5* 91.4 76.3* 75.8*  PLT 411* 434* 421* 123* 407* 439*   Basic Metabolic Panel: Recent Labs  Lab 12/30/23 1255 12/31/23 0529 01/01/24 0452 01/02/24 0458 01/03/24 0427 01/04/24 0509  NA  --  133* 133* 139 136 136  K  --  4.4 4.6 3.7 4.4 4.6  CL  --  98 100 111 98 100  CO2  --  24 26 22 29 28   GLUCOSE  --  151* 128* 85 82 82  BUN  --  17 26* <5* 19 16  CREATININE  --  1.00 0.93 0.68 0.84 0.76  CALCIUM  --  9.1 8.9 8.6* 8.6* 8.9  MG 2.9*  --   --   --  2.6* 2.6*  PHOS 3.5  --   --   --   --   --    Liver Function Tests: Recent Labs  Lab 12/30/23 0855  AST 16  ALT 9  ALKPHOS 75  BILITOT 0.5  PROT 7.4  ALBUMIN 3.8   CBG: Recent Labs  Lab 01/02/24 0721 01/03/24 0827 01/03/24 1438 01/04/24 0700 01/05/24 0716  GLUCAP 104* 90 194* 75 88    Discharge time spent: greater than 30 minutes.  Signed: Bobbetta Burnet, MD [facility] 01/05/2024

## 2024-01-05 NOTE — Progress Notes (Signed)
 Mobility Specialist Progress Note:    01/05/24 0955  Mobility  Activity Ambulated with assistance in hallway  Level of Assistance Standby assist, set-up cues, supervision of patient - no hands on  Assistive Device None  Distance Ambulated (ft) 120 ft  Range of Motion/Exercises Active;All extremities  Activity Response Tolerated well  Mobility Referral Yes  Mobility visit 1 Mobility  Mobility Specialist Start Time (ACUTE ONLY) 0930  Mobility Specialist Stop Time (ACUTE ONLY) 0955  Mobility Specialist Time Calculation (min) (ACUTE ONLY) 25 min   Pt received in bed, agreeable to mobility. Required SBA to stand and ambulate with no AD. Tolerated well, see O2 sats below. Returned to room, left pt supine. All needs met.  SpO2 89% on RA at rest SpO2 85% on RA during ambulation SpO2 91% on 2L during ambulation  Stephanie Howe Mobility Specialist Please contact via SecureChat or  Rehab office at [phone]

## 2024-01-05 NOTE — Plan of Care (Signed)

## 2024-01-05 NOTE — Progress Notes (Signed)
 SATURATION QUALIFICATIONS: (This note is used to comply with regulatory documentation for home oxygen)  Patient Saturations on Room Air at Rest = 89%  Patient Saturations on Room Air while Ambulating = 85%  Patient Saturations on 91 Liters of oxygen while Ambulating = 2%  Please briefly explain why patient needs home oxygen: Patient needs supplemental oxygen to maintain oxygen saturations.

## 2024-01-06 LAB — PROCALCITONIN: Procalcitonin: <0.1 ng/mL

## 2024-01-06 LAB — CBC
HCT: 41.6 % (ref 36.0–46.0)
Hemoglobin: 12.4 g/dL (ref 12.0–15.0)
MCH: 22.5 pg — ABNORMAL LOW (ref 26.0–34.0)
MCHC: 29.8 g/dL — ABNORMAL LOW (ref 30.0–36.0)
MCV: 75.4 fL — ABNORMAL LOW (ref 80.0–100.0)
Platelets: 438 10*3/uL — ABNORMAL HIGH (ref 150–400)
RBC: 5.52 MIL/uL — ABNORMAL HIGH (ref 3.87–5.11)
RDW: 17.3 % — ABNORMAL HIGH (ref 11.5–15.5)
WBC: 23.6 10*3/uL — ABNORMAL HIGH (ref 4.0–10.5)
nRBC: 0.1 % (ref 0.0–0.2)

## 2024-01-06 LAB — GLUCOSE, CAPILLARY: Glucose-Capillary: 79 mg/dL (ref 70–99)

## 2024-01-06 NOTE — Discharge Summary (Addendum)
 Physician Discharge Summary   Patient: Stephanie Howe MRN: [id number] DOB: 12-08-1960  Admit date:     12/30/2023  Discharge date: 01/06/24  Discharge Physician: Bobbetta Burnet   PCP: Pcp, No   Patient was seen and examined this morning stable no acute distress Successfully weaned off oxygen, satting 96% on room air - She has completed her antibiotic course, and one-time IV Lasix    Recommendations at discharge:    Follow-up with PCP in 1-2 weeks May need referral from PCP to pulmonologist To continue inhalers as directed Complete course of antibiotics, taper down Medrol Dosepak  Discharge Diagnoses: Principal Problem:   Acute respiratory failure (HCC) Active Problems:   COPD (chronic obstructive pulmonary disease) (HCC)   Essential hypertension   Hypothyroidism   Hyperlipidemia   Gastroesophageal reflux disease with esophagitis   Vitamin D deficiency disease   Memory impairment    Hospital Course: Stephanie Howe is a 63-year-old female with significant history of COPD with history of tobacco abuse (7 years ago), hypertension, hyperlipidemia, hypothyroidism, GERD, memory deficit, poor historian.... Presented to the ED with chief complaint of shortness of breath, apparently patient was satting in low 90s on room air, improved with 2 L of oxygen to 97%.    ED evaluation: Blood pressure (!) 158/72, pulse (!) 59, temperature 98.8 F (37.1 C), resp. rate (!) 22, height 5\' 1"  (1.549 m), weight 59.7 kg, SpO2 97%.  On 2 L  Labs: CBC WBC 12.4, MCV 76.4, MCH 22.6, RDW 17.4, platelets 411, neutrophil 10.5, eosinophil 0.3, BMP within normal limits, troponin 12, LFTs within normal limits, D-dimer negative at 0.44,  Chest x-ray per radiology-clear, Patient was treated with nebs, IV steroids, IV antibiotics Levaquin Requested to be admitted for COPD exacerbation       Assessment and Plan: * Acute respiratory failure (HCC)-due to COPD, and COPD exacerbation - Per record on  room air patient satting 89%, ambulating dropped to 85% 91% on 2 L of oxygen  Patient is not O2 dependent at baseline. (History of remote tobacco abuse, quit 7 years ago)  - During this admission patient received IV steroids, IV antibiotics of Levaquin -Continue scheduled and as needed DuoNeb bronchodilators -Pending IV steroids--continue tapering down with Medrol Dosepak -Antibiotics Levaquin 2 more doses to complete course of antibiotics - Improved lactic acid, procalcitonin level  COPD (chronic obstructive pulmonary disease) (HCC) -COPD exacerbation, treatment as above-much improved Will need to follow-up with pulmonologist as an outpatient  Hypothyroidism Continue Synthroid  Essential hypertension - Norvasc and beta-blocker has been adjusted, patient BP remained stable  Gastroesophageal reflux disease with esophagitis - Continue PPI  Hyperlipidemia - continue statins  Memory impairment History of dementia, continue home medication of Aricept and Namenda  Vitamin D deficiency disease Continue vitamin D supplements      Disposition: Home Diet recommendation:  Discharge Diet Orders (From admission, onward)     Start     Ordered   01/05/24 0000  Diet - low sodium heart healthy        01/05/24 1149           Cardiac diet DISCHARGE MEDICATION: Allergies as of 01/06/2024       Reactions   Codeine    Penicillins         Medication List     TAKE these medications    acetaminophen 325 MG tablet Commonly known as: TYLENOL Take 2 tablets (650 mg total) by mouth every 6 (six) hours as needed for mild  pain (pain score 1-3) (or Fever >/= 101).   acidophilus Caps capsule Take 2 capsules by mouth 3 (three) times daily for 5 days.   albuterol 108 (90 Base) MCG/ACT inhaler Commonly known as: VENTOLIN HFA Inhale 2 puffs into the lungs every 6 (six) hours as needed for wheezing or shortness of breath.   amLODipine 10 MG tablet Commonly known as:  NORVASC Take 1 tablet (10 mg total) by mouth daily. What changed:  how much to take how to take this when to take this additional instructions   aspirin EC 81 MG tablet Take 1 tablet (81 mg total) by mouth daily. Swallow whole.   Atrovent HFA 17 MCG/ACT inhaler Generic drug: ipratropium Inhale 2 puffs into the lungs every 6 (six) hours.   benzonatate 200 MG capsule Commonly known as: TESSALON Take 1 capsule (200 mg total) by mouth 3 (three) times daily.   cyanocobalamin 1000 MCG tablet Commonly known as: VITAMIN B12 Take 1 tablet (1,000 mcg total) by mouth daily.   donepezil 10 MG tablet Commonly known as: ARICEPT Take 1 tablet (10 mg total) by mouth daily.   guaiFENesin-dextromethorphan 100-10 MG/5ML syrup Commonly known as: ROBITUSSIN DM Take 10 mLs by mouth every 8 (eight) hours.   levalbuterol 45 MCG/ACT inhaler Commonly known as: XOPENEX HFA Inhale 2 puffs into the lungs 4 (four) times daily.   levothyroxine 50 MCG tablet Commonly known as: Synthroid TAKE 1 & 1/2 Tablets BY MOUTH ONCE EVERY DAY ON MONDAY, WEDNESDAY AND FRIDAY AND TAKE 1 Tablet BY MOUTH ONCE EVERY DAY ON SUNDAY, TUESDAY, THURSDAY, AND SATURDAY   lidocaine 5 % Commonly known as: LIDODERM Place 1 patch onto the skin daily. Remove & Discard patch within 12 hours or as directed by MD   memantine 10 MG tablet Commonly known as: Namenda Take 1 tablet (10 mg total) by mouth 2 (two) times daily.   methylPREDNISolone 4 MG Tbpk tablet Commonly known as: MEDROL DOSEPAK Medrol Dosepak take as instructed   metoprolol tartrate 25 MG tablet Commonly known as: LOPRESSOR Take 0.5 tablets (12.5 mg total) by mouth 2 (two) times daily. What changed:  medication strength how much to take               Durable Medical Equipment  (From admission, onward)           Start     Ordered   01/05/24 1220  For home use only DME oxygen  Once       Question Answer Comment  Length of Need 6 Months    Mode or (Route) Mask   Liters per Minute 2   Oxygen delivery system Gas      04 /14/25 1220            Follow-up Information     [facility]. Schedule an appointment as soon as possible for a visit.   Why: [phone]               Discharge Exam: Filed Weights   01/04/24 0500 01/05/24 0443 01/06/24 0437  Weight: 57.6 kg 57.3 kg 57.3 kg            General:  AAO x 3,  cooperative, no distress;   HEENT:  Normocephalic, PERRL, otherwise with in Normal limits   Neuro:  CNII-XII intact. , normal motor and sensation, reflexes intact   Lungs:   Clear to auscultation BL, Respirations unlabored,  Much improved wheezes / crackles  Cardio:    S1/S2, RRR, No murmure,  No Rubs or Gallops   Abdomen:  Soft, non-tender, bowel sounds active all four quadrants, no guarding or peritoneal signs.  Muscular  skeletal:  Limited exam -global generalized weaknesses - in bed, able to move all 4 extremities,   2+ pulses,  symmetric, No pitting edema  Skin:  Dry, warm to touch, negative for any Rashes,  Wounds: Please see nursing documentation              Condition at discharge: fair  The results of significant diagnostics from this hospitalization (including imaging, microbiology, ancillary and laboratory) are listed below for reference.   Imaging Studies: DG Chest Port 1 View Result Date: 12/30/2023 CLINICAL DATA:  Shortness of breath, cough. EXAM: PORTABLE CHEST 1 VIEW COMPARISON:  December 02, 2023. FINDINGS: The heart size and mediastinal contours are within normal limits. Both lungs are clear. The visualized skeletal structures are unremarkable. IMPRESSION: No active disease. Electronically Signed   By: Lupita Raider M.D.   On: 12/30/2023 10:46    Microbiology: Results for orders placed or performed during the hospital encounter of 12/30/23  Resp panel by RT-PCR (RSV, Flu A&B, Covid) Anterior Nasal Swab     Status: None   Collection Time: 12/30/23 12:24 PM    Specimen: Anterior Nasal Swab  Result Value Ref Range Status   SARS Coronavirus 2 by RT PCR NEGATIVE NEGATIVE Final    Comment: (NOTE) SARS-CoV-2 target nucleic acids are NOT DETECTED.  The SARS-CoV-2 RNA is generally detectable in upper respiratory specimens during the acute phase of infection. The lowest concentration of SARS-CoV-2 viral copies this assay can detect is 138 copies/mL. A negative result does not preclude SARS-Cov-2 infection and should not be used as the sole basis for treatment or other patient management decisions. A negative result may occur with  improper specimen collection/handling, submission of specimen other than nasopharyngeal swab, presence of viral mutation(s) within the areas targeted by this assay, and inadequate number of viral copies(<138 copies/mL). A negative result must be combined with clinical observations, patient history, and epidemiological information. The expected result is Negative.  Fact Sheet for Patients:  BloggerCourse.com  Fact Sheet for Healthcare Providers:  SeriousBroker.it  This test is no t yet approved or cleared by the [place] FDA and  has been authorized for detection and/or diagnosis of SARS-CoV-2 by FDA under an Emergency Use Authorization (EUA). This EUA will remain  in effect (meaning this test can be used) for the duration of the COVID-19 declaration under Section 564(b)(1) of the Act, 21 U.S.C.section 360bbb-3(b)(1), unless the authorization is terminated  or revoked sooner.       Influenza A by PCR NEGATIVE NEGATIVE Final   Influenza B by PCR NEGATIVE NEGATIVE Final    Comment: (NOTE) The Xpert Xpress SARS-CoV-2/FLU/RSV plus assay is intended as an aid in the diagnosis of influenza from Nasopharyngeal swab specimens and should not be used as a sole basis for treatment. Nasal washings and aspirates are unacceptable for Xpert Xpress  SARS-CoV-2/FLU/RSV testing.  Fact Sheet for Patients: BloggerCourse.com  Fact Sheet for Healthcare Providers: SeriousBroker.it  This test is not yet approved or cleared by the [place] FDA and has been authorized for detection and/or diagnosis of SARS-CoV-2 by FDA under an Emergency Use Authorization (EUA). This EUA will remain in effect (meaning this test can be used) for the duration of the COVID-19 declaration under Section 564(b)(1) of the Act, 21 U.S.C. section 360bbb-3(b)(1), unless the authorization is terminated or revoked.  Resp Syncytial Virus by PCR NEGATIVE NEGATIVE Final    Comment: (NOTE) Fact Sheet for Patients: BloggerCourse.com  Fact Sheet for Healthcare Providers: SeriousBroker.it  This test is not yet approved or cleared by the [place]  FDA and has been authorized for detection and/or diagnosis of SARS-CoV-2 by FDA under an Emergency Use Authorization (EUA). This EUA will remain in effect (meaning this test can be used) for the duration of the COVID-19 declaration under Section 564(b)(1) of the Act, 21 U.S.C. section 360bbb-3(b)(1), unless the authorization is terminated or revoked.  Performed at [facility], [address], [place], [place] [address]   Culture, blood (Routine X 2) w Reflex to ID Panel     Status: None   Collection Time: 12/30/23 12:55 PM   Specimen: BLOOD  Result Value Ref Range Status   Specimen Description BLOOD BLOOD RIGHT ARM  Final   Special Requests   Final    BOTTLES DRAWN AEROBIC AND ANAEROBIC Blood Culture adequate volume   Culture   Final    NO GROWTH 5 DAYS Performed at [facility], [address], [place], [place] [address]    Report Status 01/04/2024 FINAL  Final  Culture, blood (Routine X 2) w Reflex to ID Panel     Status: None   Collection Time: 12/30/23 12:55 PM   Specimen: BLOOD  Result Value Ref Range  Status   Specimen Description BLOOD RW  Final   Special Requests   Final    BOTTLES DRAWN AEROBIC AND ANAEROBIC Blood Culture adequate volume   Culture   Final    NO GROWTH 5 DAYS Performed at [facility], [address], [place], [place] [address]    Report Status 01/04/2024 FINAL  Final    Labs: CBC: Recent Labs  Lab 01/01/24 0452 01/02/24 0458 01/03/24 0427 01/04/24 0509 01/06/24 0434  WBC 27.9* 5.6 16.7* 19.8* 23.6*  HGB 10.9* 13.3 10.6* 11.4* 12.4  HCT 36.8 39.5 35.0* 37.8 41.6  MCV 76.5* 91.4 76.3* 75.8* 75.4*  PLT 421* 123* 407* 439* 438*   Basic Metabolic Panel: Recent Labs  Lab 12/30/23 1255 12/31/23 0529 01/01/24 0452 01/02/24 0458 01/03/24 0427 01/04/24 0509  NA  --  133* 133* 139 136 136  K  --  4.4 4.6 3.7 4.4 4.6  CL  --  98 100 111 98 100  CO2  --  24 26 22 29 28   GLUCOSE  --  151* 128* 85 82 82  BUN  --  17 26* <5* 19 16  CREATININE  --  1.00 0.93 0.68 0.84 0.76  CALCIUM  --  9.1 8.9 8.6* 8.6* 8.9  MG 2.9*  --   --   --  2.6* 2.6*  PHOS 3.5  --   --   --   --   --    Liver Function Tests: No results for input(s): "AST", "ALT", "ALKPHOS", "BILITOT", "PROT", "ALBUMIN" in the last 168 hours.  CBG: Recent Labs  Lab 01/03/24 0827 01/03/24 1438 01/04/24 0700 01/05/24 0716 01/06/24 0714  GLUCAP 90 194* 75 88 79    Discharge time spent: greater than 30 minutes.  Signed: Bobbetta Burnet, MD [facility] 01/06/2024

## 2024-01-06 NOTE — Plan of Care (Signed)
  Problem: Education: Goal: Knowledge of General Education information will improve Description: Including pain rating scale, medication(s)/side effects and non-pharmacologic comfort measures Outcome: Progressing   Problem: Health Behavior/Discharge Planning: Goal: Ability to manage health-related needs will improve Outcome: Progressing   Problem: Clinical Measurements: Goal: Will remain free from infection Outcome: Progressing Goal: Diagnostic test results will improve Outcome: Progressing   Problem: Nutrition: Goal: Adequate nutrition will be maintained Outcome: Progressing   Problem: Pain Managment: Goal: General experience of comfort will improve and/or be controlled Outcome: Progressing

## 2024-01-06 NOTE — Plan of Care (Signed)

## 2024-01-06 NOTE — Progress Notes (Signed)
 Mobility Specialist Progress Note:    01/06/24 1005  Mobility  Activity Ambulated with assistance in hallway  Level of Assistance Standby assist, set-up cues, supervision of patient - no hands on  Assistive Device None  Distance Ambulated (ft) 130 ft  Range of Motion/Exercises Active;All extremities  Activity Response Tolerated well  Mobility Referral Yes  Mobility visit 1 Mobility  Mobility Specialist Start Time (ACUTE ONLY) 0945  Mobility Specialist Stop Time (ACUTE ONLY) 1005  Mobility Specialist Time Calculation (min) (ACUTE ONLY) 20 min   Pt received in bed, agreeable to mobility. Required SBA to stand and ambulate with no AD. Tolerated well, SpO2 90% on RA throughout. SpO2 drop to 89% on RA, quickly recovered with standing rest break. Returned to room, left pt supine. All needs met.  Glinda Lapping Mobility Specialist Please contact via Special educational needs teacher or  Rehab office at [phone]

## 2024-01-06 NOTE — TOC Transition Note (Signed)
 Transition of Care [facility]) - Discharge Note   Patient Details  Name: Stephanie Howe MRN: [id number] Date of Birth: 1961-06-08  [facility] CM/SW Contact:  Orelia Binet, RN Phone Number: 01/06/2024, 11:53 AM   Clinical Narrative:   CM at the bedside to give patient a MATCH voucher ($3 coupon) and Good RX card to assist with prescription.  [facility] referral made and reminded patient to call for an appointment. Patient discharging home, she will call her son for transport.     Final next level of care: Home/Self Care Barriers to Discharge: Barriers Resolved   Patient Goals and CMS Choice Patient states their goals for this hospitalization and ongoing recovery are:: return home CMS Medicare.gov Compare Post Acute Care list provided to:: Patient Choice offered to / list presented to : Patient      Discharge Placement                    Patient and family notified of of transfer: 01/06/24  Discharge Plan and Services Additional resources added to the After Visit Summary for                                       Social Drivers of Health (SDOH) Interventions SDOH Screenings   Food Insecurity: Patient Declined (12/30/2023)  Housing: Patient Declined (12/30/2023)  Transportation Needs: Patient Declined (12/30/2023)  Utilities: Patient Declined (12/30/2023)  Alcohol Screen: Low Risk  (10/24/2020)  Depression (PHQ2-9): Low Risk  (10/24/2020)  Tobacco Use: Medium Risk (12/30/2023)     Readmission Risk Interventions    12/30/2023    6:11 PM  Readmission Risk Prevention Plan  Post Dischage Appt Complete  Medication Screening Complete  Transportation Screening Complete

## 2024-01-08 ENCOUNTER — Telehealth: Payer: Self-pay

## 2024-01-14 ENCOUNTER — Telehealth: Payer: Self-pay | Admitting: Acute Care

## 2024-01-15 NOTE — Telephone Encounter (Signed)
 Returned call and reviewed the [place] program with patient. Patient request I speak with her daughter about transportation to [facility] for the scan. I have LVM with patient's daughter Chritsy and request call back to discuss the [place] program.

## 2024-01-28 NOTE — Telephone Encounter (Signed)
 Met with patient on 01/27/2024 and submitted a [place] Medicaid application with documents attached.

## 2024-02-11 ENCOUNTER — Ambulatory Visit
Admission: RE | Admit: 2024-02-11 | Discharge: 2024-02-11 | Disposition: A | Discharge status: Home / Self Care | Payer: Self-pay | Source: Ambulatory Visit | Attending: Acute Care | Admitting: Acute Care

## 2024-02-11 DIAGNOSIS — F17210 Nicotine dependence, cigarettes, uncomplicated: Secondary | ICD-10-CM | POA: Insufficient documentation

## 2024-02-11 DIAGNOSIS — Z87891 Personal history of nicotine dependence: Secondary | ICD-10-CM | POA: Insufficient documentation

## 2024-02-11 DIAGNOSIS — Z122 Encounter for screening for malignant neoplasm of respiratory organs: Secondary | ICD-10-CM | POA: Insufficient documentation

## 2024-02-13 ENCOUNTER — Ambulatory Visit: Admission: RE | Admit: 2024-02-13 | Payer: MEDICAID | Source: Ambulatory Visit

## 2024-03-08 ENCOUNTER — Other Ambulatory Visit: Payer: Self-pay | Admitting: Acute Care

## 2024-03-08 DIAGNOSIS — Z87891 Personal history of nicotine dependence: Secondary | ICD-10-CM

## 2024-03-08 DIAGNOSIS — Z122 Encounter for screening for malignant neoplasm of respiratory organs: Secondary | ICD-10-CM

## 2024-05-21 ENCOUNTER — Encounter: Payer: Self-pay | Admitting: Family Medicine

## 2024-05-21 ENCOUNTER — Ambulatory Visit: Payer: Self-pay | Admitting: Family Medicine

## 2024-05-21 VITALS — BP 159/87 | HR 86 | Ht 61.0 in | Wt 128.0 lb

## 2024-05-21 DIAGNOSIS — R7301 Impaired fasting glucose: Secondary | ICD-10-CM | POA: Diagnosis not present

## 2024-05-21 DIAGNOSIS — J441 Chronic obstructive pulmonary disease with (acute) exacerbation: Secondary | ICD-10-CM

## 2024-05-21 DIAGNOSIS — Z1211 Encounter for screening for malignant neoplasm of colon: Secondary | ICD-10-CM

## 2024-05-21 DIAGNOSIS — E038 Other specified hypothyroidism: Secondary | ICD-10-CM

## 2024-05-21 DIAGNOSIS — E559 Vitamin D deficiency, unspecified: Secondary | ICD-10-CM

## 2024-05-21 DIAGNOSIS — R300 Dysuria: Secondary | ICD-10-CM | POA: Diagnosis not present

## 2024-05-21 DIAGNOSIS — I10 Essential (primary) hypertension: Secondary | ICD-10-CM

## 2024-05-21 DIAGNOSIS — Z1159 Encounter for screening for other viral diseases: Secondary | ICD-10-CM

## 2024-05-21 DIAGNOSIS — Z8709 Personal history of other diseases of the respiratory system: Secondary | ICD-10-CM

## 2024-05-21 DIAGNOSIS — Z1231 Encounter for screening mammogram for malignant neoplasm of breast: Secondary | ICD-10-CM

## 2024-05-21 MED ORDER — OLMESARTAN MEDOXOMIL 20 MG PO TABS: 20.0000 mg | ORAL_TABLET | Freq: Every day | ORAL | 3 refills | Status: DC

## 2024-05-21 MED ORDER — ALBUTEROL SULFATE HFA 108 (90 BASE) MCG/ACT IN AERS: 2.0000 | INHALATION_SPRAY | Freq: Four times a day (QID) | RESPIRATORY_TRACT | 2 refills | Status: DC | PRN

## 2024-05-21 MED ORDER — BREZTRI AEROSPHERE 160-9-4.8 MCG/ACT IN AERO
2.0000 | INHALATION_SPRAY | Freq: Two times a day (BID) | RESPIRATORY_TRACT | 11 refills | Status: DC
Start: 2024-05-21 — End: 2024-05-25

## 2024-05-21 MED ORDER — MEMANTINE HCL 10 MG PO TABS: 10.0000 mg | ORAL_TABLET | Freq: Two times a day (BID) | ORAL | 5 refills | Status: DC

## 2024-05-21 MED ORDER — DONEPEZIL HCL 10 MG PO TABS: 10.0000 mg | ORAL_TABLET | Freq: Every day | ORAL | 6 refills | Status: DC

## 2024-05-21 NOTE — Assessment & Plan Note (Signed)
 Patient requesting referral to pulmonary for PFT Patient not currently on maintenance therapy. Started patient on Breztri  Aerosphere. Referral to Pulmonology for further evaluation and management.

## 2024-05-21 NOTE — Assessment & Plan Note (Signed)
 Vitals:   05/21/24 1003 05/21/24 1004  BP: (!) 158/94 (!) 159/87  Not controlled Started patient on Olmesartan  20 mg once daily Follow up in 2 weeks via mychart with at home blood pressure readings to monitor trends Labs ordered. Discussed with  patient to monitor their blood pressure regularly and maintain a heart-healthy diet rich in fruits, vegetables, whole grains, and low-fat dairy, while reducing sodium intake to less than 2,300 mg per day. Regular physical activity, such as 30 minutes of moderate exercise most days of the week, will help lower blood pressure and improve overall cardiovascular health. Avoiding smoking, limiting alcohol consumption, and managing stress. Take  prescribed medication, & take it as directed and avoid skipping doses. Seek emergency care if your blood pressure is (over 180/100) or you experience chest pain, shortness of breath, or sudden vision changes.Patient verbalizes understanding regarding plan of care and all questions answered.

## 2024-05-21 NOTE — Patient Instructions (Signed)

## 2024-05-21 NOTE — Progress Notes (Signed)
 New Patient Office Visit   Subjective   Patient ID: Stephanie Howe, female    DOB: [birth year]  Age: 63 y.o. MRN: [id number]  CC:  Chief Complaint  Patient presents with   Establish Care   Medical Management of Chronic Issues    Daughter present, concerned about breathing and memory    HPI Stephanie Howe 63 year old female, presents to establish care. 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  HPI    Outpatient Encounter Medications as of 05/21/2024  Medication Sig   budesonide -glycopyrrolate-formoterol (BREZTRI  AEROSPHERE) 160-9-4.8 MCG/ACT AERO inhaler Inhale 2 puffs into the lungs 2 (two) times daily.   levothyroxine  (SYNTHROID ) 50 MCG tablet TAKE 1 & 1/2 Tablets BY MOUTH ONCE EVERY DAY ON MONDAY, WEDNESDAY AND FRIDAY AND TAKE 1 Tablet BY MOUTH ONCE EVERY DAY ON SUNDAY, TUESDAY, THURSDAY, AND SATURDAY   olmesartan  (BENICAR ) 20 MG tablet Take 1 tablet (20 mg total) by mouth daily.   [DISCONTINUED] albuterol  (VENTOLIN  HFA) 108 (90 Base) MCG/ACT inhaler Inhale 2 puffs into the lungs every 6 (six) hours as needed for wheezing or shortness of breath.   [DISCONTINUED] amLODipine  (NORVASC ) 10 MG tablet Take 1 tablet (10 mg total) by mouth daily.   [DISCONTINUED] donepezil  (ARICEPT ) 10 MG tablet Take 1 tablet (10 mg total) by mouth daily.   [DISCONTINUED] memantine  (NAMENDA ) 10 MG tablet Take 1 tablet (10 mg total) by mouth 2 (two) times daily.   [DISCONTINUED] metoprolol  tartrate (LOPRESSOR ) 25 MG tablet Take 0.5 tablets (12.5 mg total) by mouth 2 (two) times daily.   albuterol  (VENTOLIN  HFA) 108 (90 Base) MCG/ACT inhaler Inhale 2 puffs into the lungs every 6 (six) hours as needed for wheezing or shortness of breath.   donepezil  (ARICEPT ) 10 MG tablet  Take 1 tablet (10 mg total) by mouth daily.   memantine  (NAMENDA ) 10 MG tablet Take 1 tablet (10 mg total) by mouth 2 (two) times daily.   [DISCONTINUED] acetaminophen  (TYLENOL ) 325 MG tablet Take 2 tablets (650 mg total) by mouth every 6 (six) hours as needed for mild pain (pain score 1-3) (or Fever >/= 101).   [DISCONTINUED] aspirin  EC 81 MG tablet Take 1 tablet (81 mg total) by mouth daily. Swallow whole.   [DISCONTINUED] benzonatate  (TESSALON ) 200 MG capsule Take 1 capsule (200 mg total) by mouth 3 (three) times daily.   [DISCONTINUED] cyanocobalamin (VITAMIN B12) 1000 MCG tablet Take 1 tablet (1,000 mcg total) by mouth daily.   [DISCONTINUED] guaiFENesin -dextromethorphan  (ROBITUSSIN DM) 100-10 MG/5ML syrup Take 10 mLs by mouth every 8 (eight) hours.   [DISCONTINUED] ipratropium (ATROVENT  HFA) 17 MCG/ACT inhaler Inhale 2 puffs into the lungs every 6 (six) hours.   [DISCONTINUED] levalbuterol  (XOPENEX  HFA) 45 MCG/ACT inhaler Inhale 2 puffs into the lungs 4 (four) times daily.   [DISCONTINUED] lidocaine  (LIDODERM ) 5 % Place 1 patch onto the skin daily. Remove & Discard patch within 12 hours or as directed by MD   [DISCONTINUED] methylPREDNISolone  (MEDROL  DOSEPAK) 4 MG TBPK tablet Medrol  Dosepak take as instructed   No facility-administered encounter medications on file as of 05/21/2024.    Past Surgical History:  Procedure Laterality Date   ABDOMINAL HYSTERECTOMY  1994   BREAST EXCISIONAL BIOPSY Left 10+yrs ago   neg   COLON SURGERY  2002   colon exploratory surgery   TONSILLECTOMY  1984  Review of Systems  Constitutional:  Negative for chills and fever.  Respiratory:  Positive for shortness of breath.   Cardiovascular:  Negative for chest pain.  Gastrointestinal:  Positive for constipation and melena. Negative for abdominal pain.  Genitourinary:  Positive for dysuria.  Neurological:  Negative for dizziness and headaches.      Objective    BP (!) 159/87   Pulse 86   Ht 5' 1  (1.549 m)   Wt 128 lb (58.1 kg)   SpO2 90%   BMI 24.19 kg/m   Physical Exam Vitals reviewed.  Constitutional:      General: She is not in acute distress.    Appearance: Normal appearance. She is not ill-appearing, toxic-appearing or diaphoretic.  HENT:     Head: Normocephalic.  Eyes:     General:        Right eye: No discharge.        Left eye: No discharge.     Conjunctiva/sclera: Conjunctivae normal.  Cardiovascular:     Rate and Rhythm: Normal rate.     Pulses: Normal pulses.     Heart sounds: Normal heart sounds.  Pulmonary:     Effort: Pulmonary effort is normal. No respiratory distress.     Breath sounds: Normal breath sounds.  Abdominal:     General: Bowel sounds are normal.     Palpations: Abdomen is soft.     Tenderness: There is no abdominal tenderness. There is no right CVA tenderness, left CVA tenderness or guarding.  Skin:    General: Skin is warm and dry.  Neurological:     Mental Status: She is alert.  Psychiatric:        Mood and Affect: Mood normal.        Behavior: Behavior normal.       Assessment & Plan:  Primary hypertension -     Lipid panel -     CMP14+EGFR -     CBC with Differential/Platelet  Screening mammogram for breast cancer -     3D Screening Mammogram, Left and Right; Future  Screen for colon cancer -     Fecal occult blood, imunochemical; Future  Need for hepatitis C screening test -     Hepatitis C antibody  TSH (thyroid -stimulating hormone deficiency) -     TSH + free T4  IFG (impaired fasting glucose) -     Hemoglobin A1c  Vitamin D  deficiency -     VITAMIN D  25 Hydroxy (Vit-D Deficiency, Fractures)  History of COPD -     Pulmonary Visit  Dysuria -     UA/M w/rflx Culture, Routine  Chronic obstructive pulmonary disease with acute exacerbation [facility]) Assessment & Plan: Patient requesting referral to pulmonary for PFT Patient not currently on maintenance therapy. Started patient on Breztri  Aerosphere. Referral  to Pulmonology for further evaluation and management.   Essential hypertension Assessment & Plan: Vitals:   05/21/24 1003 05/21/24 1004  BP: (!) 158/94 (!) 159/87  Not controlled Started patient on Olmesartan  20 mg once daily Follow up in 2 weeks via mychart with at home blood pressure readings to monitor trends Labs ordered. Discussed with  patient to monitor their blood pressure regularly and maintain a heart-healthy diet rich in fruits, vegetables, whole grains, and low-fat dairy, while reducing sodium intake to less than 2,300 mg per day. Regular physical activity, such as 30 minutes of moderate exercise most days of the week, will help lower blood pressure and improve overall cardiovascular health. Avoiding  smoking, limiting alcohol consumption, and managing stress. Take  prescribed medication, & take it as directed and avoid skipping doses. Seek emergency care if your blood pressure is (over 180/100) or you experience chest pain, shortness of breath, or sudden vision changes.Patient verbalizes understanding regarding plan of care and all questions answered.    Other orders -     Albuterol  Sulfate HFA; Inhale 2 puffs into the lungs every 6 (six) hours as needed for wheezing or shortness of breath.  Dispense: 8 g; Refill: 2 -     Breztri  Aerosphere; Inhale 2 puffs into the lungs 2 (two) times daily.  Dispense: 10.7 g; Refill: 11 -     Olmesartan  Medoxomil; Take 1 tablet (20 mg total) by mouth daily.  Dispense: 30 tablet; Refill: 3 -     Memantine  HCl; Take 1 tablet (10 mg total) by mouth 2 (two) times daily.  Dispense: 60 tablet; Refill: 5 -     Donepezil  HCl; Take 1 tablet (10 mg total) by mouth daily.  Dispense: 30 tablet; Refill: 6    Return in about 3 months (around 08/21/2024), or if symptoms worsen or fail to improve, for chronic follow-up.   Hilario Kidd Wilhelmena Falter, FNP

## 2024-05-24 LAB — UA/M W/RFLX CULTURE, ROUTINE
Bilirubin, UA: NEGATIVE
Glucose, UA: NEGATIVE
Ketones, UA: NEGATIVE
Nitrite, UA: NEGATIVE
Protein,UA: NEGATIVE
RBC, UA: NEGATIVE
Specific Gravity, UA: 1.018 (ref 1.005–1.030)
Urobilinogen, Ur: 0.2 mg/dL (ref 0.2–1.0)
pH, UA: 7 (ref 5.0–7.5)

## 2024-05-24 LAB — TSH+FREE T4
Free T4: 0.83 ng/dL (ref 0.82–1.77)
TSH: 10.6 u[IU]/mL — ABNORMAL HIGH (ref 0.450–4.500)

## 2024-05-24 LAB — URINE CULTURE, REFLEX

## 2024-05-24 LAB — CMP14+EGFR
ALT: 10 IU/L (ref 0–32)
AST: 20 IU/L (ref 0–40)
Albumin: 4.7 g/dL (ref 3.9–4.9)
Alkaline Phosphatase: 114 IU/L (ref 44–121)
BUN/Creatinine Ratio: 15 (ref 12–28)
BUN: 12 mg/dL (ref 8–27)
Bilirubin Total: 0.3 mg/dL (ref 0.0–1.2)
CO2: 23 mmol/L (ref 20–29)
Calcium: 9.9 mg/dL (ref 8.7–10.3)
Chloride: 99 mmol/L (ref 96–106)
Creatinine, Ser: 0.81 mg/dL (ref 0.57–1.00)
Globulin, Total: 2.6 g/dL (ref 1.5–4.5)
Glucose: 87 mg/dL (ref 70–99)
Potassium: 4.6 mmol/L (ref 3.5–5.2)
Sodium: 140 mmol/L (ref 134–144)
Total Protein: 7.3 g/dL (ref 6.0–8.5)
eGFR: 81 mL/min/1.73 (ref >59)

## 2024-05-24 LAB — CBC WITH DIFFERENTIAL/PLATELET
Basophils Absolute: 0.1 x10E3/uL (ref 0.0–0.2)
Basos: 1 %
EOS (ABSOLUTE): 0.4 x10E3/uL (ref 0.0–0.4)
Eos: 3 %
Hematocrit: 44.9 % (ref 34.0–46.6)
Hemoglobin: 12.8 g/dL (ref 11.1–15.9)
Immature Grans (Abs): 0.1 x10E3/uL (ref 0.0–0.1)
Immature Granulocytes: 1 %
Lymphocytes Absolute: 1.5 x10E3/uL (ref 0.7–3.1)
Lymphs: 12 %
MCH: 21.5 pg — ABNORMAL LOW (ref 26.6–33.0)
MCHC: 28.5 g/dL — ABNORMAL LOW (ref 31.5–35.7)
MCV: 75 fL — ABNORMAL LOW (ref 79–97)
Monocytes Absolute: 0.5 x10E3/uL (ref 0.1–0.9)
Monocytes: 4 %
Neutrophils Absolute: 10.1 x10E3/uL — ABNORMAL HIGH (ref 1.4–7.0)
Neutrophils: 79 %
Platelets: 465 x10E3/uL — ABNORMAL HIGH (ref 150–450)
RBC: 5.96 x10E6/uL — ABNORMAL HIGH (ref 3.77–5.28)
RDW: 16.2 % — ABNORMAL HIGH (ref 11.7–15.4)
WBC: 12.6 x10E3/uL — ABNORMAL HIGH (ref 3.4–10.8)

## 2024-05-24 LAB — HEMOGLOBIN A1C
Est. average glucose Bld gHb Est-mCnc: 108 mg/dL
Hgb A1c MFr Bld: 5.4 % (ref 4.8–5.6)

## 2024-05-24 LAB — LIPID PANEL
Chol/HDL Ratio: 2.9 ratio (ref 0.0–4.4)
Cholesterol, Total: 218 mg/dL — ABNORMAL HIGH (ref 100–199)
HDL: 74 mg/dL (ref >39)
LDL Chol Calc (NIH): 121 mg/dL — ABNORMAL HIGH (ref 0–99)
Triglycerides: 132 mg/dL (ref 0–149)
VLDL Cholesterol Cal: 23 mg/dL (ref 5–40)

## 2024-05-24 LAB — VITAMIN D 25 HYDROXY (VIT D DEFICIENCY, FRACTURES): Vit D, 25-Hydroxy: 14.1 ng/mL — ABNORMAL LOW (ref 30.0–100.0)

## 2024-05-24 LAB — MICROSCOPIC EXAMINATION
Casts: NONE SEEN /LPF
RBC, Urine: NONE SEEN /HPF (ref 0–2)

## 2024-05-24 LAB — HEPATITIS C ANTIBODY: Hep C Virus Ab: NONREACTIVE

## 2024-05-25 ENCOUNTER — Other Ambulatory Visit: Payer: Self-pay | Admitting: Family Medicine

## 2024-05-25 ENCOUNTER — Other Ambulatory Visit (HOSPITAL_COMMUNITY): Payer: Self-pay

## 2024-05-25 ENCOUNTER — Telehealth: Payer: Self-pay | Admitting: Pharmacy Technician

## 2024-05-25 ENCOUNTER — Ambulatory Visit: Payer: Self-pay | Admitting: Family Medicine

## 2024-05-25 MED ORDER — MOMETASONE FURO-FORMOTEROL FUM 100-5 MCG/ACT IN AERO: 2.0000 | INHALATION_SPRAY | Freq: Two times a day (BID) | RESPIRATORY_TRACT | 1 refills | Status: DC

## 2024-05-25 MED ORDER — LEVOTHYROXINE SODIUM 75 MCG PO TABS: 75.0000 ug | ORAL_TABLET | Freq: Every day | ORAL | 1 refills | Status: DC

## 2024-05-25 MED ORDER — ROSUVASTATIN CALCIUM 20 MG PO TABS: 20.0000 mg | ORAL_TABLET | Freq: Every day | ORAL | 1 refills | Status: DC

## 2024-05-25 NOTE — Progress Notes (Unsigned)
 as

## 2024-05-25 NOTE — Telephone Encounter (Signed)
 Pharmacy Patient Advocate Encounter   Received notification from CoverMyMeds that prior authorization for Breztri  Aerosphere 160-9-4.8MCG/ACT aerosol is required/requested.   Insurance verification completed.   The patient is insured through [employer] .   Per test claim:  see below is preferred by the insurance.  If suggested medication is appropriate, Please send in a new RX and discontinue this one. If not, please advise as to why it's not appropriate so that we may request a Prior Authorization. Please note, some preferred medications may still require a PA.  If the suggested medications have not been trialed and there are no contraindications to their use, the PA will not be submitted, as it will not be approved.

## 2024-05-25 NOTE — Telephone Encounter (Signed)
Sent dulera

## 2024-05-26 NOTE — Telephone Encounter (Signed)
 Thank you :)

## 2024-09-03 ENCOUNTER — Encounter: Payer: Self-pay | Admitting: Internal Medicine

## 2024-09-03 ENCOUNTER — Ambulatory Visit: Admitting: Internal Medicine

## 2024-09-03 ENCOUNTER — Ambulatory Visit (HOSPITAL_COMMUNITY)
Admission: RE | Admit: 2024-09-03 | Discharge: 2024-09-03 | Disposition: A | Source: Ambulatory Visit | Attending: Internal Medicine | Admitting: Internal Medicine

## 2024-09-03 ENCOUNTER — Ambulatory Visit

## 2024-09-03 VITALS — BP 125/63 | HR 78 | Ht 61.0 in | Wt 134.0 lb

## 2024-09-03 VITALS — BP 143/84 | HR 90 | Ht 61.0 in | Wt 134.0 lb

## 2024-09-03 DIAGNOSIS — R413 Other amnesia: Secondary | ICD-10-CM

## 2024-09-03 DIAGNOSIS — J4489 Other specified chronic obstructive pulmonary disease: Secondary | ICD-10-CM | POA: Insufficient documentation

## 2024-09-03 DIAGNOSIS — R0609 Other forms of dyspnea: Secondary | ICD-10-CM | POA: Insufficient documentation

## 2024-09-03 DIAGNOSIS — I10 Essential (primary) hypertension: Secondary | ICD-10-CM | POA: Diagnosis not present

## 2024-09-03 DIAGNOSIS — Z87891 Personal history of nicotine dependence: Secondary | ICD-10-CM | POA: Diagnosis not present

## 2024-09-03 DIAGNOSIS — J9600 Acute respiratory failure, unspecified whether with hypoxia or hypercapnia: Secondary | ICD-10-CM | POA: Diagnosis not present

## 2024-09-03 DIAGNOSIS — E782 Mixed hyperlipidemia: Secondary | ICD-10-CM | POA: Diagnosis not present

## 2024-09-03 DIAGNOSIS — J441 Chronic obstructive pulmonary disease with (acute) exacerbation: Secondary | ICD-10-CM

## 2024-09-03 DIAGNOSIS — E039 Hypothyroidism, unspecified: Secondary | ICD-10-CM

## 2024-09-03 DIAGNOSIS — R059 Cough, unspecified: Secondary | ICD-10-CM | POA: Diagnosis not present

## 2024-09-03 DIAGNOSIS — R0602 Shortness of breath: Secondary | ICD-10-CM | POA: Diagnosis not present

## 2024-09-03 MED ORDER — AZITHROMYCIN 250 MG PO TABS: ORAL_TABLET | ORAL | 0 refills | Status: AC

## 2024-09-03 MED ORDER — ROSUVASTATIN CALCIUM 20 MG PO TABS: 20.0000 mg | ORAL_TABLET | Freq: Every day | ORAL | 3 refills | Status: AC

## 2024-09-03 MED ORDER — ALBUTEROL SULFATE HFA 108 (90 BASE) MCG/ACT IN AERS: 2.0000 | INHALATION_SPRAY | Freq: Four times a day (QID) | RESPIRATORY_TRACT | 2 refills | Status: AC | PRN

## 2024-09-03 MED ORDER — MEMANTINE HCL 10 MG PO TABS: 10.0000 mg | ORAL_TABLET | Freq: Two times a day (BID) | ORAL | 3 refills | Status: AC

## 2024-09-03 MED ORDER — LEVOTHYROXINE SODIUM 75 MCG PO TABS: 75.0000 ug | ORAL_TABLET | Freq: Every day | ORAL | 3 refills | Status: AC

## 2024-09-03 MED ORDER — DONEPEZIL HCL 10 MG PO TABS: 10.0000 mg | ORAL_TABLET | Freq: Every day | ORAL | 3 refills | Status: AC

## 2024-09-03 MED ORDER — BUDESONIDE 0.25 MG/2ML IN SUSP: RESPIRATORY_TRACT | 12 refills | Status: AC

## 2024-09-03 MED ORDER — IPRATROPIUM-ALBUTEROL 0.5-2.5 (3) MG/3ML IN SOLN: 3.0000 mL | Freq: Four times a day (QID) | RESPIRATORY_TRACT | 11 refills | Status: AC | PRN

## 2024-09-03 MED ORDER — OLMESARTAN MEDOXOMIL 20 MG PO TABS: 20.0000 mg | ORAL_TABLET | Freq: Every day | ORAL | 3 refills | Status: AC

## 2024-09-03 NOTE — Assessment & Plan Note (Addendum)
 Quit smoking 2013 and downhill since  - - LDSCT   02/11/24 Centrilobular and paraseptal emphysema  - Allergy screen 09/03/2024 >  Eos 0. /  IgE  / alpha one At phenotype pending  - DOE labs done 09/03/2024  - trial of duonbeb / bud 0.25 mg twice daily with extra doses prn    When respiratory symptoms begin or become refractory well after a patient reports complete smoking cessation,  Especially when this wasn't the case while they were smoking, a red flag is raised based on the work of Dr Genette which states:  if you quit smoking when your best day FEV1 is still relatively  preserved it is highly unlikely you will progress to severe disease.  That is to say, once the smoking stops,  the symptoms should not suddenly erupt or markedly worsen.  If so, the differential diagnosis should include  obesity/deconditioning,  LPR/Reflux/Aspiration syndromes,  occult CHF, or  especially side effect of medications commonly used in this population, as well as the ddx above (2 of the most likely factors highlighted twice in this A/p)  with no adverse drug effects likely based on the med list provided/ confirmed with her daughter.  For now assume that she really hasn't had effective rx due to cognitivie decline (unable to use devices to date)  and is also likely to be severely deconditioned so hopefully can use duoneb pre exertion to see if helps and add the budesonide  for any Airway Th2  type inflammation driving her cough while rx symptomatically with mucinex  products  Each maintenance medication was reviewed in detail including emphasizing most importantly the difference between maintenance and prns and under what circumstances the prns are to be triggered using an action plan format where appropriate.  Total time for H and P, chart review, counseling, reviewing hfa/neb/ device(s) , directly observing portions of ambulatory 02 saturation study/ and generating customized AVS unique to this office visit / same day  charting = 48 min with complex pt new to me.

## 2024-09-03 NOTE — Progress Notes (Unsigned)
 Established Patient Office Visit  Subjective   Patient ID: Stephanie Howe, female    DOB: Mar 01, 1961  Age: 63 y.o. MRN: [id number]  Chief Complaint  Patient presents with   Medical Management of Chronic Issues    3 month follow up    HPI Discussed the use of AI scribe software for clinical note transcription with the patient, who gave verbal consent to proceed.  History of Present Illness    Stephanie Howe is a 63 year old female with hypertension who presents for a three-month follow-up. She is accompanied by her daughter, who is her primary caregiver.  Hypertension management - Present for three-month follow-up for blood pressure control. - Blood pressure was slightly elevated during a pulmonary visit earlier today. - Current antihypertensive medication: olmesartan .  Pulmonary symptoms and medication administration - Uses albuterol  inhaler twice daily. - Recently prescribed Pulmicort , Duoneb, and a Z-Pak. - Nebulizer solution has been sent to the pharmacy, but nebulizer machine has not been obtained. - Daughter requests prescription for nebulizer machine to be sent to [facility] pharmacy to expedite acquisition.  Medication reconciliation - Current medications include Aricept , Synthroid , Namenda , olmesartan , and rosuvastatin .  Recent relocation and care coordination - Recently moved to [place] to live with her daughter, who is her primary caregiver. - Pharmacy changed to [facility] on [address].     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   Memory impairment 02/25/2022   Aortic atherosclerosis 05/18/2019   Gastroesophageal reflux disease with esophagitis 05/18/2019   Hx of tobacco use, presenting hazards to health 07/03/2016   Early menopause 07/03/2016   Hyperlipidemia 12/27/2015    Primary hypertension    COPD (chronic obstructive pulmonary disease) (HCC)    Hypothyroidism    Vitamin D  deficiency disease     ROS    Objective:     BP 125/63   Pulse 78   Ht 5' 1 (1.549 m)   Wt 134 lb (60.8 kg)   SpO2 90%   BMI 25.32 kg/m  BP Readings from Last 3 Encounters:  09/03/24 125/63  09/03/24 (!) 143/84  05/21/24 (!) 159/87   Wt Readings from Last 3 Encounters:  09/03/24 134 lb (60.8 kg)  09/03/24 134 lb (60.8 kg)  05/21/24 128 lb (58.1 kg)     Physical Exam Vitals and nursing note reviewed.  Constitutional:      Appearance: Normal appearance.  HENT:     Head: Normocephalic.  Eyes:     Extraocular Movements: Extraocular movements intact.     Pupils: Pupils are equal, round, and reactive to light.  Cardiovascular:     Rate and Rhythm: Normal rate and regular rhythm.  Pulmonary:     Effort: Pulmonary effort is normal.     Breath sounds: Normal breath sounds.  Musculoskeletal:     Cervical back: Normal range of motion and neck supple.  Neurological:     Mental Status: She is alert and oriented to person, place, and time.  Psychiatric:        Mood and Affect: Mood normal.        Thought Content: Thought content normal.      No results found for any visits on 09/03/24.  Last CBC Lab Results  Component Value Date   WBC 12.6 (H) 05/21/2024   HGB 12.8 05/21/2024  HCT 44.9 05/21/2024   MCV 75 (L) 05/21/2024   MCH 21.5 (L) 05/21/2024   RDW 16.2 (H) 05/21/2024   PLT 465 (H) 05/21/2024   Last metabolic panel Lab Results  Component Value Date   GLUCOSE 87 05/21/2024   NA 140 05/21/2024   K 4.6 05/21/2024   CL 99 05/21/2024   CO2 23 05/21/2024   BUN 12 05/21/2024   CREATININE 0.81 05/21/2024   EGFR 81 05/21/2024   CALCIUM  9.9 05/21/2024   PHOS 3.5 12/30/2023   PROT 7.3 05/21/2024   ALBUMIN 4.7 05/21/2024   LABGLOB 2.6 05/21/2024   BILITOT 0.3 05/21/2024   ALKPHOS 114 05/21/2024   AST 20 05/21/2024   ALT 10 05/21/2024   ANIONGAP 8  01/04/2024   Last lipids Lab Results  Component Value Date   CHOL 218 (H) 05/21/2024   HDL 74 05/21/2024   LDLCALC 121 (H) 05/21/2024   TRIG 132 05/21/2024   CHOLHDL 2.9 05/21/2024   Last hemoglobin A1c Lab Results  Component Value Date   HGBA1C 5.4 05/21/2024   Last thyroid  functions Lab Results  Component Value Date   TSH 10.600 (H) 05/21/2024   FREET4 0.83 05/21/2024   Last vitamin D  Lab Results  Component Value Date   VD25OH 14.1 (L) 05/21/2024   Last vitamin B12 and Folate Lab Results  Component Value Date   VITAMINB12 825 10/22/2022      The 10-year ASCVD risk score (Arnett DK, et al., 2019) is: 5.9%    Assessment & Plan:   Problem List Items Addressed This Visit       Cardiovascular and Mediastinum   Primary hypertension - Primary   Blood pressure is well-controlled with current medication regimen. - Continue current antihypertensive medication regimen. - Scheduled follow-up in six months.      Relevant Medications   olmesartan  (BENICAR ) 20 MG tablet   rosuvastatin  (CRESTOR ) 20 MG tablet     Respiratory   COPD (chronic obstructive pulmonary disease) (HCC)   Recent exacerbation managed with nebulizer treatments and inhalers. Nebulizer machine prescription needed due to insurance requirements. - Prescribed nebulizer machine to pharmacy. - Ensured albuterol  inhaler is refilled.      Relevant Medications   albuterol  (VENTOLIN  HFA) 108 (90 Base) MCG/ACT inhaler     Endocrine   Hypothyroidism   Thyroid  function to be monitored with recent blood tests ordered by another provider. - Continue current thyroid  medication regimen.      Relevant Medications   levothyroxine  (SYNTHROID ) 75 MCG tablet     Other   Hyperlipidemia   Currently managed with rosuvastatin  20 mg. Update fasting labs today.        Relevant Medications   olmesartan  (BENICAR ) 20 MG tablet   rosuvastatin  (CRESTOR ) 20 MG tablet   Memory impairment   History of dementia,  continue home medication of Aricept  and Namenda       Relevant Medications   donepezil  (ARICEPT ) 10 MG tablet   memantine  (NAMENDA ) 10 MG tablet    Return in about 6 months (around 03/04/2025) for chronic follow-up with PCP.    Leita Longs, FNP

## 2024-09-03 NOTE — Progress Notes (Unsigned)
 Stephanie Howe Hamilton, female    DOB: [birth year]    MRN: [id number]   Brief patient profile:  63  yowf ? Asthma as child outgrew ? Elementary school  referred to pulmonary clinic in [place]  09/03/2024 by Ilian Polanco NP  for cough and sob present when stopped smoking in 2013   Pt not previously seen by PCCM service.     History of Present Illness  09/03/2024  Pulmonary/ 1st office eval/ Ellieanna Funderburg / [place] [facility]  Chief Complaint  Patient presents with   Establish Care    Doe - dyspnea Coughing non productive    Dyspnea:  room to room x one year/ can't shop s scooter  Cough: mild rattle worse in am  Sleep: bed is flat with pillows x 2 sev times a week better in recliner  SABA use: albuterol  hfa can't really use it  02: none     No obvious day to day or daytime pattern/variability or assoc  mucus plugs or hemoptysis or cp or chest tightness, subjective wheeze or overt sinus or hb symptoms.    Also denies any obvious fluctuation of symptoms with weather or environmental changes or other aggravating or alleviating factors except as outlined above   No unusual exposure hx or h/o childhood pna  or knowledge of premature birth.  Current Allergies, Complete Past Medical History, Past Surgical History, Family History, and Social History were reviewed in [employer] record.  ROS  The following are not active complaints unless bolded Hoarseness, sore throat, dysphagia, dental problems, itching, sneezing,  nasal congestion or discharge of excess mucus or purulent secretions, ear ache,   fever, chills, sweats, unintended wt loss or wt gain, classically pleuritic or exertional cp,  orthopnea pnd or arm/hand swelling  or leg swelling, presyncope, palpitations, abdominal pain, anorexia, nausea, vomiting, diarrhea  or change in bowel habits or change in bladder habits, change in stools or change in urine, dysuria, hematuria,  rash, arthralgias, visual complaints, headache,  numbness, weakness or ataxia or problems with walking or coordination,  change in mood or  memory.            Outpatient Medications Prior to Visit  Medication Sig Dispense Refill   albuterol  (VENTOLIN  HFA) 108 (90 Base) MCG/ACT inhaler Inhale 2 puffs into the lungs every 6 (six) hours as needed for wheezing or shortness of breath. 8 g 2   donepezil  (ARICEPT ) 10 MG tablet Take 1 tablet (10 mg total) by mouth daily. 30 tablet 6   levothyroxine  (SYNTHROID ) 75 MCG tablet Take 1 tablet (75 mcg total) by mouth daily. 90 tablet 1   memantine  (NAMENDA ) 10 MG tablet Take 1 tablet (10 mg total) by mouth 2 (two) times daily. 60 tablet 5   mometasone -formoterol  (DULERA) 100-5 MCG/ACT AERO Inhale 2 puffs into the lungs 2 (two) times daily. 8.8 g 1   olmesartan  (BENICAR ) 20 MG tablet Take 1 tablet (20 mg total) by mouth daily. 30 tablet 3   rosuvastatin  (CRESTOR ) 20 MG tablet Take 1 tablet (20 mg total) by mouth daily. 90 tablet 1   No facility-administered medications prior to visit.    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   Qualifier: Diagnosis of  By: Margrette MD, Stanley     Heartburn 07/03/2016   Hx of iron deficiency anemia 11/15/2016   Hypertension    Hypothyroidism  Overweight    Vitamin D  deficiency disease       Objective:     BP (!) 143/84   Pulse 90   Ht 5' 1 (1.549 m)   Wt 134 lb (60.8 kg)   SpO2 91% Comment: ra  BMI 25.32 kg/m   SpO2: 91 % (ra)  amb somber wf lets her daughter answer the questions  - poor recall for childhood    HEENT : Oropharynx  clear / edentulous      Nasal turbinates mild edema s polyps or purulence   NECK :  without  apparent JVD/ palpable Nodes/TM    LUNGS: no acc muscle use,  Mild / mod kyphotic contour chest which is clear to A and P bilaterally without cough on insp or exp maneuvers   CV:  RRR  no s3 or murmur or  increase in P2, and no edema   ABD:  soft and nontender   MS:  Gait nl   ext warm without deformities Or obvious joint restrictions  calf tenderness, cyanosis or clubbing    SKIN: warm and dry without lesions    NEURO:  alert, approp, nl sensorium with  no motor or cerebellar deficits apparent.    CXR PA and Lateral:   09/03/2024 :    I personally reviewed images and impression is as follows:     C/w mild copd/ mod kyphosis    Assessment   Assessment & Plan DOE (dyspnea on exertion) Quit smoking 2013 and gradually downhill since - 09/03/2024   Walked on RA  x  2  lap(s) =  approx 300  ft  @ slow to mod pace, stopped due to sob  with lowest 02 sats 91%  s any rx prior on day of ov - DOE labs 09/03/2024  pending   Symptoms ar  disproportionate to objective findings and not clear to what extent this is actually a pulmonary  problem but pt does appear to have difficult to sort out respiratory symptoms of unknown origin for which  DDX  = almost all start with A and  include Adherence, Ace Inhibitors, Acid Reflux, Active Sinus Disease, Alpha 1 Antitripsin deficiency, Anxiety masquerading as Airways dz,  ABPA,  Allergy(esp in young), Aspiration (esp in elderly), Adverse effects of meds,  Active smoking or Vaping, A bunch of PE's/clot burden (a few small clots can't cause this syndrome unless there is already severe underlying pulm or vascular dz with poor reserve),  Anemia or thyroid  disorder, plus two Bs  = Bronchiectasis and Beta blocker use..and one C= CHF    Bolded dx's are the most likley here with Anxiety assoc with depression/ deconditioning/ cognitive decline all potentially contibuting but all the others will be addressed with labs pending and rx if indicated.   Asthmatic bronchitis , chronic (HCC) Quit smoking 2013 and downhill since  - - LDSCT   02/11/24 Centrilobular and paraseptal emphysema  - Allergy screen 09/03/2024 >  Eos 0. /  IgE  / alpha one At phenotype pending  - DOE labs  done 09/03/2024  - trial of duonbeb / bud 0.25 mg twice daily with extra doses prn    When respiratory symptoms begin or become refractory well after a patient reports complete smoking cessation,  Especially when this wasn't the case while they were smoking, a red flag is raised based on the work of Dr Genette which states:  if you quit smoking when your best day FEV1 is still relatively  preserved  it is highly unlikely you will progress to severe disease.  That is to say, once the smoking stops,  the symptoms should not suddenly erupt or markedly worsen.  If so, the differential diagnosis should include  obesity/deconditioning,  LPR/Reflux/Aspiration syndromes,  occult CHF, or  especially side effect of medications commonly used in this population, as well as the ddx above (2 of the most likely factors highlighted twice in this A/p)  with no adverse drug effects likely based on the med list provided/ confirmed with her daughter.  For now assume that she really hasn't had effective rx due to cognitivie decline (unable to use devices to date)  and is also likely to be severely deconditioned so hopefully can use duoneb pre exertion to see if helps and add the budesonide  for any Airway Th2  type inflammation driving her cough while rx symptomatically with mucinex  products  Each maintenance medication was reviewed in detail including emphasizing most importantly the difference between maintenance and prns and under what circumstances the prns are to be triggered using an action plan format where appropriate.  Total time for H and P, chart review, counseling, reviewing hfa/neb/ device(s) , directly observing portions of ambulatory 02 saturation study/ and generating customized AVS unique to this office visit / same day charting = 48 min with complex pt new to me.         AVS  Patient Instructions  1) Stop dulera  2) For cough/ congestion > mucinex  or mucinex  dm  up to maximum of  1200 mg every 12  hours as needed  3) Zpak   4) Duoneb with budesonide  0.25 mg first thing in AM and about 12 hours later  In between the automatic neb treatments (4)  if can't catch your breath ok to use albuterol  inhaler up to 2 puffs every 4 hours and if still not better then duoneb by itself twice more in 24 hours   Also  Ok to try albuterol  15 min before an activity (on alternating days)  that you know would usually make you short of breath and see if it makes any difference and if makes none then don't take albuterol  after activity unless you can't catch your breath as this means it's the resting that helps, not the albuterol .      Please remember to go to the lab department   for your tests - we will call you with the results when they are available.     Please remember to go to the  x-ray department  @  [facility] for your tests - we will call you with the results when they are available      Please schedule a follow up office visit in 6 weeks, call sooner if needed with all medications /inhalers/ solutions in hand so we can verify exactly what you are taking. This includes all medications from all doctors and over the counters    Ozell America, MD 09/03/2024

## 2024-09-03 NOTE — Patient Instructions (Addendum)
 1) Stop dulera  2) For cough/ congestion > mucinex  or mucinex  dm  up to maximum of  1200 mg every 12 hours as needed  3) Zpak   4) Duoneb with budesonide  0.25 mg first thing in AM and about 12 hours later  In between the automatic neb treatments (4)  if can't catch your breath ok to use albuterol  inhaler up to 2 puffs every 4 hours and if still not better then duoneb by itself twice more in 24 hours   Also  Ok to try albuterol  15 min before an activity (on alternating days)  that you know would usually make you short of breath and see if it makes any difference and if makes none then don't take albuterol  after activity unless you can't catch your breath as this means it's the resting that helps, not the albuterol .      Please remember to go to the lab department   for your tests - we will call you with the results when they are available.     Please remember to go to the  x-ray department  @  [facility] for your tests - we will call you with the results when they are available      Please schedule a follow up office visit in 6 weeks, call sooner if needed with all medications /inhalers/ solutions in hand so we can verify exactly what you are taking. This includes all medications from all doctors and over the counters

## 2024-09-04 NOTE — Assessment & Plan Note (Addendum)
 Quit smoking 2013 and gradually downhill since - 09/03/2024   Walked on RA  x  2  lap(s) =  approx 300  ft  @ slow to mod pace, stopped due to sob  with lowest 02 sats 91%  s any rx prior on day of ov - DOE labs 09/03/2024  pending   Symptoms ar  disproportionate to objective findings and not clear to what extent this is actually a pulmonary  problem but pt does appear to have difficult to sort out respiratory symptoms of unknown origin for which  DDX  = almost all start with A and  include Adherence, Ace Inhibitors, Acid Reflux, Active Sinus Disease, Alpha 1 Antitripsin deficiency, Anxiety masquerading as Airways dz,  ABPA,  Allergy(esp in young), Aspiration (esp in elderly), Adverse effects of meds,  Active smoking or Vaping, A bunch of PE's/clot burden (a few small clots can't cause this syndrome unless there is already severe underlying pulm or vascular dz with poor reserve),  Anemia or thyroid  disorder, plus two Bs  = Bronchiectasis and Beta blocker use..and one C= CHF    Bolded dx's are the most likley here with Anxiety assoc with depression/ deconditioning/ cognitive decline all potentially contibuting but all the others will be addressed with labs pending and rx if indicated.

## 2024-09-05 NOTE — Assessment & Plan Note (Signed)
 History of dementia, continue home medication of Aricept and Namenda

## 2024-09-05 NOTE — Assessment & Plan Note (Signed)
 Recent exacerbation managed with nebulizer treatments and inhalers. Nebulizer machine prescription needed due to insurance requirements. - Prescribed nebulizer machine to pharmacy. - Ensured albuterol  inhaler is refilled.

## 2024-09-05 NOTE — Assessment & Plan Note (Signed)
 Thyroid  function to be monitored with recent blood tests ordered by another provider. - Continue current thyroid  medication regimen.

## 2024-09-05 NOTE — Assessment & Plan Note (Signed)
 Currently managed with rosuvastatin  20 mg. Update fasting labs today.

## 2024-09-05 NOTE — Assessment & Plan Note (Signed)
 Blood pressure is well-controlled with current medication regimen. - Continue current antihypertensive medication regimen. - Scheduled follow-up in six months.

## 2024-09-10 LAB — CBC WITH DIFFERENTIAL/PLATELET
Basophils Absolute: 0.1 x10E3/uL (ref 0.0–0.2)
Basos: 1 %
EOS (ABSOLUTE): 0.4 x10E3/uL (ref 0.0–0.4)
Eos: 3 %
Hematocrit: 42.6 % (ref 34.0–46.6)
Hemoglobin: 12.5 g/dL (ref 11.1–15.9)
Immature Grans (Abs): 0.1 x10E3/uL (ref 0.0–0.1)
Immature Granulocytes: 1 %
Lymphocytes Absolute: 1.6 x10E3/uL (ref 0.7–3.1)
Lymphs: 11 %
MCH: 21.6 pg — ABNORMAL LOW (ref 26.6–33.0)
MCHC: 29.3 g/dL — ABNORMAL LOW (ref 31.5–35.7)
MCV: 73 fL — ABNORMAL LOW (ref 79–97)
Monocytes Absolute: 0.6 x10E3/uL (ref 0.1–0.9)
Monocytes: 4 %
Neutrophils Absolute: 12.2 x10E3/uL — ABNORMAL HIGH (ref 1.4–7.0)
Neutrophils: 80 %
Platelets: 442 x10E3/uL (ref 150–450)
RBC: 5.8 x10E6/uL — ABNORMAL HIGH (ref 3.77–5.28)
RDW: 18.7 % — ABNORMAL HIGH (ref 11.7–15.4)
WBC: 15 x10E3/uL — ABNORMAL HIGH (ref 3.4–10.8)

## 2024-09-10 LAB — BASIC METABOLIC PANEL WITH GFR
BUN/Creatinine Ratio: 19 (ref 12–28)
BUN: 14 mg/dL (ref 8–27)
CO2: 24 mmol/L (ref 20–29)
Calcium: 9.8 mg/dL (ref 8.7–10.3)
Chloride: 102 mmol/L (ref 96–106)
Creatinine, Ser: 0.73 mg/dL (ref 0.57–1.00)
Glucose: 86 mg/dL (ref 70–99)
Potassium: 4.7 mmol/L (ref 3.5–5.2)
Sodium: 143 mmol/L (ref 134–144)
eGFR: 92 mL/min/1.73

## 2024-09-10 LAB — IGE: IgE (Immunoglobulin E), Serum: 9 [IU]/mL (ref 6–495)

## 2024-09-10 LAB — ALPHA-1-ANTITRYPSIN PHENOTYP: A-1 Antitrypsin: 164 mg/dL (ref 101–187)

## 2024-09-10 LAB — BRAIN NATRIURETIC PEPTIDE: BNP: 20.8 pg/mL (ref 0.0–100.0)

## 2024-09-10 LAB — TSH: TSH: 10.3 u[IU]/mL — ABNORMAL HIGH (ref 0.450–4.500)

## 2024-09-12 ENCOUNTER — Ambulatory Visit: Payer: Self-pay | Admitting: Internal Medicine

## 2024-10-04 ENCOUNTER — Telehealth: Payer: Self-pay | Admitting: *Deleted

## 2024-10-04 DIAGNOSIS — I10 Essential (primary) hypertension: Secondary | ICD-10-CM

## 2024-10-04 DIAGNOSIS — J441 Chronic obstructive pulmonary disease with (acute) exacerbation: Secondary | ICD-10-CM

## 2024-10-04 DIAGNOSIS — J4489 Other specified chronic obstructive pulmonary disease: Secondary | ICD-10-CM

## 2024-10-04 NOTE — Progress Notes (Unsigned)
 Complex Care Management Note Care Guide Note  10/04/2024 Name: KYRAN WHITTIER MRN: [id number] DOB: [birth year]   Complex Care Management Outreach Attempts: An unsuccessful telephone outreach was attempted today to offer the patient information about available complex care management services.  Follow Up Plan:  Additional outreach attempts will be made to offer the patient complex care management information and services.   Encounter Outcome:  No Answer  Harlene Satterfield  [facility]  [facility], [facility] Guide  Direct Dial: [phone]  Fax [phone]

## 2024-10-06 NOTE — Progress Notes (Signed)
 Complex Care Management Note Care Guide Note  10/06/2024 Name: Stephanie Howe MRN: [id number] DOB: August 13, 1961   Complex Care Management Outreach Attempts: A second unsuccessful outreach was attempted today to offer the patient with information about available complex care management services.  Follow Up Plan:  Additional outreach attempts will be made to offer the patient complex care management information and services.   Encounter Outcome:  No Answer  Harlene Satterfield  [facility]  [facility], [facility] Guide  Direct Dial: [phone]  Fax [phone]

## 2024-10-07 NOTE — Progress Notes (Signed)
 Complex Care Management Note Care Guide Note  10/07/2024 Name: Stephanie Howe MRN: [id number] DOB: [birth year]   Complex Care Management Outreach Attempts: A third unsuccessful outreach was attempted today to offer the patient with information about available complex care management services.  Follow Up Plan:  No further outreach attempts will be made at this time. We have been unable to contact the patient to offer or enroll patient in complex care management services.  Encounter Outcome:  No Answer  Harlene Satterfield  [facility]  [facility], [facility] Guide  Direct Dial: [phone]  Fax [phone]

## 2024-10-15 ENCOUNTER — Ambulatory Visit: Admitting: Internal Medicine

## 2024-10-15 DIAGNOSIS — R0609 Other forms of dyspnea: Secondary | ICD-10-CM

## 2024-10-15 DIAGNOSIS — J4489 Other specified chronic obstructive pulmonary disease: Secondary | ICD-10-CM

## 2024-10-15 NOTE — Progress Notes (Unsigned)
 "   Stephanie Howe, female    DOB: [birth year]    MRN: [id number]   Brief patient profile:  63  yowf ? Asthma as child outgrew ? Elementary school  referred to pulmonary clinic in [place]  09/03/2024 by Ilian Polanco NP  for cough and sob present when stopped smoking in 2013   Pt not previously seen by PCCM service.     History of Present Illness  09/03/2024  Pulmonary/ 1st office eval/ Ted Leonhart / [place] [facility]  Chief Complaint  Patient presents with   Establish Care    Doe - dyspnea Coughing non productive    Dyspnea:  room to room x one year/ can't shop s scooter  Cough: mild rattle worse in am  Sleep: bed is flat with pillows x 2 sev times a week better in recliner  SABA use: albuterol  hfa can't really use it  02: none  Patient Instructions  1) Stop dulera 2) For cough/ congestion > mucinex  or mucinex  dm  up to maximum of  1200 mg every 12 hours as needed 3) Zpak  4) Duoneb with budesonide  0.25 mg first thing in AM and about 12 hours later In between the automatic neb treatments (4)  if can't catch your breath ok to use albuterol  inhaler up to 2 puffs every 4 hours and if still not better then duoneb by itself twice more in 24 hours  Also  Ok to try albuterol  15 min before an activity (on alternating days)  that you know would usually make you short of breath       - Allergy screen 09/03/2024 >  Eos 0.4 /  IgE 9 / alpha one At phenotype  MM level 174 - DOE labs done 09/03/2024     Please schedule a follow up office visit in 6 weeks, call sooner if needed with all medications /inhalers/ solutions in hand    10/15/2024  f/u ov/[place] office/Kaian Fahs re: *** maint on ***  did *** bring meds  No chief complaint on file.   Dyspnea:  *** Cough: *** Sleeping: ***   resp cc  SABA use: *** 02: ***  Lung cancer screening: ***   No obvious day to day or daytime variability or assoc excess/ purulent sputum or mucus plugs or hemoptysis or cp or chest tightness, subjective  wheeze or overt sinus or hb symptoms.    Also denies any obvious fluctuation of symptoms with weather or environmental changes or other aggravating or alleviating factors except as outlined above   No unusual exposure hx or h/o childhood pna/ asthma or knowledge of premature birth.  Current Allergies, Complete Past Medical History, Past Surgical History, Family History, and Social History were reviewed in [employer] record.  ROS  The following are not active complaints unless bolded Hoarseness, sore throat, dysphagia, dental problems, itching, sneezing,  nasal congestion or discharge of excess mucus or purulent secretions, ear ache,   fever, chills, sweats, unintended wt loss or wt gain, classically pleuritic or exertional cp,  orthopnea pnd or arm/hand swelling  or leg swelling, presyncope, palpitations, abdominal pain, anorexia, nausea, vomiting, diarrhea  or change in bowel habits or change in bladder habits, change in stools or change in urine, dysuria, hematuria,  rash, arthralgias, visual complaints, headache, numbness, weakness or ataxia or problems with walking or coordination,  change in mood or  memory.         Outpatient Medications Prior to Visit  Medication Sig Dispense Refill  albuterol  (VENTOLIN  HFA) 108 (90 Base) MCG/ACT inhaler Inhale 2 puffs into the lungs every 6 (six) hours as needed for wheezing or shortness of breath. 8 g 2   azithromycin  (ZITHROMAX ) 250 MG tablet Take 2 on day one then 1 daily x 4 days 6 tablet 0   budesonide  (PULMICORT ) 0.25 MG/2ML nebulizer solution Combine with albuterol  in nebulizer twice daily 120 mL 12   donepezil  (ARICEPT ) 10 MG tablet Take 1 tablet (10 mg total) by mouth daily. 90 tablet 3   ipratropium-albuterol  (DUONEB) 0.5-2.5 (3) MG/3ML SOLN Take 3 mLs by nebulization every 6 (six) hours as needed. Take with budesonide  0.25 mg twice daily 360 mL 11   levothyroxine  (SYNTHROID ) 75 MCG tablet Take 1 tablet (75 mcg total) by  mouth daily. 90 tablet 3   memantine  (NAMENDA ) 10 MG tablet Take 1 tablet (10 mg total) by mouth 2 (two) times daily. 180 tablet 3   olmesartan  (BENICAR ) 20 MG tablet Take 1 tablet (20 mg total) by mouth daily. 90 tablet 3   rosuvastatin  (CRESTOR ) 20 MG tablet Take 1 tablet (20 mg total) by mouth daily. 90 tablet 3   No facility-administered medications prior to visit.          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   Qualifier: Diagnosis of  By: Margrette MD, Stanley     Heartburn 07/03/2016   Hx of iron deficiency anemia 11/15/2016   Hypertension    Hypothyroidism    Overweight    Vitamin D  deficiency disease       Objective:     Wt Readings from Last 3 Encounters:  09/03/24 134 lb (60.8 kg)  09/03/24 134 lb (60.8 kg)  05/21/24 128 lb (58.1 kg)      Vital signs reviewed  10/15/2024  - Note at rest 02 sats  ***% on ***   General appearance:    ***     CXR PA and Lateral:   09/03/2024 :    I personally reviewed images and impression is as follows:     C/w mild copd/ mod kyphosis Over read confirmed    Assessment         "

## 2025-03-09 ENCOUNTER — Ambulatory Visit: Payer: Self-pay
